# Patient Record
Sex: Male | Born: 1955 | Race: Black or African American | Hispanic: No | Marital: Married | Smoking: Former smoker
Health system: Southern US, Community
[De-identification: ages and names within clinical notes are randomized; demographics above are authoritative.]

## PROBLEM LIST (undated history)

## (undated) DIAGNOSIS — S46011A Strain of muscle(s) and tendon(s) of the rotator cuff of right shoulder, initial encounter: Secondary | ICD-10-CM

## (undated) DIAGNOSIS — I10 Essential (primary) hypertension: Secondary | ICD-10-CM

## (undated) DIAGNOSIS — S43491A Other sprain of right shoulder joint, initial encounter: Secondary | ICD-10-CM

## (undated) HISTORY — PX: COLONOSCOPY: SHX174

---

## 1993-02-15 HISTORY — PX: SKIN GRAFT FULL THICKNESS LEG: SUR1299

## 1998-12-10 ENCOUNTER — Emergency Department (HOSPITAL_COMMUNITY): Admission: EM | Admit: 1998-12-10 | Discharge: 1998-12-10 | Payer: Self-pay | Admitting: Emergency Medicine

## 2001-01-28 ENCOUNTER — Emergency Department (HOSPITAL_COMMUNITY): Admission: EM | Admit: 2001-01-28 | Discharge: 2001-01-28 | Payer: Self-pay | Admitting: *Deleted

## 2001-04-08 ENCOUNTER — Emergency Department (HOSPITAL_COMMUNITY): Admission: EM | Admit: 2001-04-08 | Discharge: 2001-04-08 | Payer: Self-pay | Admitting: Emergency Medicine

## 2002-02-13 ENCOUNTER — Ambulatory Visit (HOSPITAL_COMMUNITY): Admission: RE | Admit: 2002-02-13 | Discharge: 2002-02-13 | Payer: Self-pay | Admitting: General Surgery

## 2002-08-01 ENCOUNTER — Emergency Department (HOSPITAL_COMMUNITY): Admission: EM | Admit: 2002-08-01 | Discharge: 2002-08-01 | Payer: Self-pay | Admitting: Emergency Medicine

## 2002-11-20 ENCOUNTER — Emergency Department (HOSPITAL_COMMUNITY): Admission: EM | Admit: 2002-11-20 | Discharge: 2002-11-21 | Payer: Self-pay | Admitting: *Deleted

## 2002-11-21 ENCOUNTER — Encounter: Payer: Self-pay | Admitting: *Deleted

## 2003-04-21 ENCOUNTER — Emergency Department (HOSPITAL_COMMUNITY): Admission: EM | Admit: 2003-04-21 | Discharge: 2003-04-21 | Payer: Self-pay | Admitting: Emergency Medicine

## 2004-07-02 ENCOUNTER — Emergency Department (HOSPITAL_COMMUNITY): Admission: EM | Admit: 2004-07-02 | Discharge: 2004-07-02 | Payer: Self-pay | Admitting: Emergency Medicine

## 2004-07-14 ENCOUNTER — Emergency Department (HOSPITAL_COMMUNITY): Admission: EM | Admit: 2004-07-14 | Discharge: 2004-07-14 | Payer: Self-pay | Admitting: Emergency Medicine

## 2004-07-27 ENCOUNTER — Emergency Department (HOSPITAL_COMMUNITY): Admission: EM | Admit: 2004-07-27 | Discharge: 2004-07-27 | Payer: Self-pay | Admitting: Emergency Medicine

## 2005-01-11 ENCOUNTER — Emergency Department (HOSPITAL_COMMUNITY): Admission: EM | Admit: 2005-01-11 | Discharge: 2005-01-11 | Payer: Self-pay | Admitting: Emergency Medicine

## 2005-01-12 ENCOUNTER — Emergency Department (HOSPITAL_COMMUNITY): Admission: EM | Admit: 2005-01-12 | Discharge: 2005-01-12 | Payer: Self-pay | Admitting: Emergency Medicine

## 2005-04-05 ENCOUNTER — Ambulatory Visit (HOSPITAL_COMMUNITY): Admission: RE | Admit: 2005-04-05 | Discharge: 2005-04-05 | Payer: Self-pay | Admitting: Internal Medicine

## 2005-04-05 ENCOUNTER — Encounter: Payer: Self-pay | Admitting: Internal Medicine

## 2005-04-05 ENCOUNTER — Ambulatory Visit: Payer: Self-pay | Admitting: Internal Medicine

## 2005-08-11 ENCOUNTER — Emergency Department (HOSPITAL_COMMUNITY): Admission: EM | Admit: 2005-08-11 | Discharge: 2005-08-11 | Payer: Self-pay | Admitting: Emergency Medicine

## 2006-06-19 ENCOUNTER — Emergency Department (HOSPITAL_COMMUNITY): Admission: EM | Admit: 2006-06-19 | Discharge: 2006-06-19 | Payer: Self-pay | Admitting: Emergency Medicine

## 2006-11-29 ENCOUNTER — Ambulatory Visit (HOSPITAL_COMMUNITY): Admission: RE | Admit: 2006-11-29 | Discharge: 2006-11-29 | Payer: Self-pay | Admitting: Internal Medicine

## 2006-11-29 ENCOUNTER — Encounter: Payer: Self-pay | Admitting: Orthopedic Surgery

## 2007-04-26 ENCOUNTER — Ambulatory Visit: Payer: Self-pay | Admitting: Orthopedic Surgery

## 2007-04-26 DIAGNOSIS — M169 Osteoarthritis of hip, unspecified: Secondary | ICD-10-CM

## 2007-04-26 DIAGNOSIS — M4800 Spinal stenosis, site unspecified: Secondary | ICD-10-CM

## 2007-04-26 DIAGNOSIS — M1610 Unilateral primary osteoarthritis, unspecified hip: Secondary | ICD-10-CM | POA: Insufficient documentation

## 2007-05-16 ENCOUNTER — Telehealth: Payer: Self-pay | Admitting: Orthopedic Surgery

## 2007-12-19 ENCOUNTER — Emergency Department (HOSPITAL_COMMUNITY): Admission: EM | Admit: 2007-12-19 | Discharge: 2007-12-19 | Payer: Self-pay | Admitting: Emergency Medicine

## 2008-02-19 ENCOUNTER — Encounter (HOSPITAL_COMMUNITY): Admission: RE | Admit: 2008-02-19 | Discharge: 2008-03-13 | Payer: Self-pay | Admitting: Internal Medicine

## 2009-08-16 ENCOUNTER — Emergency Department (HOSPITAL_COMMUNITY): Admission: EM | Admit: 2009-08-16 | Discharge: 2009-08-16 | Payer: Self-pay | Admitting: Emergency Medicine

## 2009-12-02 ENCOUNTER — Emergency Department (HOSPITAL_COMMUNITY): Admission: EM | Admit: 2009-12-02 | Discharge: 2009-12-02 | Payer: Self-pay | Admitting: Emergency Medicine

## 2010-07-03 NOTE — Op Note (Signed)
NAME:  [name], [name]             ACCOUNT NO.:  [id number]   MEDICAL RECORD NO.:  [id number]          PATIENT TYPE:  AMB   LOCATION:  DAY                           FACILITY:  [facility]   PHYSICIAN:  R. Roetta Sessions, M.D. DATE OF BIRTH:  04/13/1955   DATE OF PROCEDURE:  04/05/2005  DATE OF DISCHARGE:                                 OPERATIVE REPORT   PROCEDURE:  Colonoscopy with biopsy.   INDICATIONS FOR PROCEDURE:  The patient is a 55 year old gentleman with no  lower GI tract symptoms who is sent over for colorectal cancer screening at  the courtesy of Dr. Felecia Shelling. Potential risks, benefits, and alternatives have  been reviewed. There is no family history of colorectal neoplasia.  Colonoscopy is now being done.   PROCEDURE NOTE:  O2 saturation, blood pressure, pulse, and respirations were  monitored throughout the entire procedure. Conscious sedation with Versed 3  mg IV and Demerol 75 mg IV in divided doses.   INSTRUMENT:  Olympus video chip system.   FINDINGS:  Digital rectal exam revealed no abnormalities.   ENDOSCOPIC FINDINGS:  Prep was good.   Rectum:  Examination of the rectal mucosa including retroflexed view of the  anal verge revealed multiple 2 to 3 mm little papillomatous lesions on the  mucosa. Please see photos. Otherwise, rectal mucosa appeared normal.   Colon:  Colonic mucosa was surveyed from the rectosigmoid junction through  the left, transverse, and right colon to the area of the appendiceal  orifice, ileocecal valve, and cecum. These structures were well seen and  photographed for the record. From this level, the scope was slowly  withdrawn, and all previously mentioned mucosal surfaces were again seen.  The patient had sigmoid diverticula and had similar lesions at 30 cm in a  cluster of 4 to 5 that were seen in the distal rectum. This looked more like  papillomas. Two of these were biopsied with cold-biopsy forceps. The patient  tolerated the procedure well  and was reactive to endoscopy.   IMPRESSION:  1.  Multiple papillomatous lesions, distal rectum. Otherwise normal rectum.  2.  Cluster of similar lesions at 30 cm. Sigmoid diverticula. The remainder      of colonic mucosa appeared normal. Status post biopsy of the lesions at      30 cm.   RECOMMENDATIONS:  1.  Follow up on pathology.  2.  Diverticulosis literature provided to [name].  3.  Further recommendations to follow.      [name], M.D.  Electronically Signed     RMR/MEDQ  D:  04/05/2005  T:  04/05/2005  Job:  [id number]   cc:   Tesfaye D. Felecia Shelling, MD  Fax: [phone]

## 2010-07-03 NOTE — H&P (Signed)
   NAME:  [name], [name]                       ACCOUNT NO.:  [id number]   MEDICAL RECORD NO.:  [id number]                   PATIENT TYPE:  AMB   LOCATION:  DAY                                  FACILITY:  [facility]   PHYSICIAN:  Jerolyn Shin C. Katrinka Blazing, M.D.                DATE OF BIRTH:  [birth year]   DATE OF ADMISSION:  DATE OF DISCHARGE:                                HISTORY & PHYSICAL   HISTORY OF PRESENT ILLNESS:  [age]-year-old male with history of  painful mass of his right breast.  The mass has had rapid enlargement in  size.  His right breast is noticeably larger than the left.  Because of the  size of the mass it is felt that it needs to be excised under anesthesia.   PAST HISTORY:  The patient has hypertension.   MEDICATIONS:  The patient's only medication is Lotensin 20 mg q.d.   ALLERGIES:  The patient has no known allergies.   PAST SURGICAL HISTORY:  The patient's only surgery was a left subcutaneous  mastectomy in the past.   PHYSICAL EXAMINATION:  VITAL SIGNS:  Blood pressure 150/100, pulse 92,  respirations 20 and weight 259 pounds.  HEENT:  No abnormalities.  NECK:  Supple without JVD or bruits.  CHEST:  Clear to auscultation.  HEART:  Regular rate and rhythm without murmur, gallop or rub.  ABDOMEN:  Soft and nontender.  No masses.  BREASTS:  Large tender mass, right breast, with mild fixation.  No  induration or skin changes.  EXTREMITIES:  No cyanosis, clubbing or edema.  NEUROLOGIC EXAMINATION:  No focal motor, sensory or cerebellar deficit.   IMPRESSION:  1. Symptomatic mass, right breast, probable gynecomastia.  2. Hypertension.   PLAN:  Subcutaneous mastectomy on the right.   ADDENDUM:  Other medication is Cardizem LA 240 mg q.h.s.                                                 Dirk Dress. Katrinka Blazing, M.D.   LCS/MEDQ  D:  02/13/2002  T:  02/13/2002  Job:  [id number]

## 2010-07-03 NOTE — Op Note (Signed)
   NAME:  [name], [name]                       ACCOUNT NO.:  [id number]   MEDICAL RECORD NO.:  [id number]                   PATIENT TYPE:  AMB   LOCATION:  DAY                                  FACILITY:  [facility]   PHYSICIAN:  Jerolyn Shin C. Katrinka Blazing, M.D.                DATE OF BIRTH:  Oct 08, 1955   DATE OF PROCEDURE:  02/13/2002  DATE OF DISCHARGE:                                 OPERATIVE REPORT   PREOPERATIVE DIAGNOSIS:  Right breast mass.   POSTOPERATIVE DIAGNOSIS:  Right breast mass, pathology pending.   PROCEDURE:  Right subcutaneous mastectomy.   SURGEON:  Dirk Dress. Katrinka Blazing, M.D.   DESCRIPTION OF PROCEDURE:  Under general LMA anesthesia, the patient's  breast and chest were prepped and draped in a sterile field.  Curvilinear  subareolar incision was made.  The incision was extended into the  subcutaneous tissue.  A superior skin flap was developed.  This was  developed to encompass the mass.  Incision was then extended down to the  chest wall to remove all of the tissue in the subareolar area.  A large  volume of tissue was removed.  Deep tissues were reapproximated using  interrupted 2-0 Biosyn.  Subcutaneous tissue was closed with 3-0 Biosyn.  Skin was closed with subcuticular 4-0 Dexon.  A compression dressing was  placed.  The patient tolerated the procedure well.  He was awakened from  anesthesia, transferred to a bed, and taken to the postanesthetic care unit  for monitoring.                                                Dirk Dress. Katrinka Blazing, M.D.    LCS/MEDQ  D:  02/13/2002  T:  02/13/2002  Job:  [id number]

## 2012-07-19 ENCOUNTER — Telehealth: Payer: Self-pay | Admitting: Orthopedic Surgery

## 2012-07-19 ENCOUNTER — Emergency Department (HOSPITAL_COMMUNITY)
Admission: EM | Admit: 2012-07-19 | Discharge: 2012-07-19 | Disposition: A | Discharge status: Home / Self Care | Payer: 59 | Attending: Emergency Medicine | Admitting: Emergency Medicine

## 2012-07-19 ENCOUNTER — Encounter (HOSPITAL_COMMUNITY): Payer: Self-pay | Admitting: Emergency Medicine

## 2012-07-19 DIAGNOSIS — X500XXA Overexertion from strenuous movement or load, initial encounter: Secondary | ICD-10-CM | POA: Insufficient documentation

## 2012-07-19 DIAGNOSIS — Z79899 Other long term (current) drug therapy: Secondary | ICD-10-CM | POA: Insufficient documentation

## 2012-07-19 DIAGNOSIS — Y929 Unspecified place or not applicable: Secondary | ICD-10-CM | POA: Insufficient documentation

## 2012-07-19 DIAGNOSIS — Y9389 Activity, other specified: Secondary | ICD-10-CM | POA: Insufficient documentation

## 2012-07-19 DIAGNOSIS — S29011A Strain of muscle and tendon of front wall of thorax, initial encounter: Secondary | ICD-10-CM

## 2012-07-19 DIAGNOSIS — F17200 Nicotine dependence, unspecified, uncomplicated: Secondary | ICD-10-CM | POA: Insufficient documentation

## 2012-07-19 DIAGNOSIS — IMO0002 Reserved for concepts with insufficient information to code with codable children: Secondary | ICD-10-CM | POA: Insufficient documentation

## 2012-07-19 DIAGNOSIS — IMO0001 Reserved for inherently not codable concepts without codable children: Secondary | ICD-10-CM | POA: Insufficient documentation

## 2012-07-19 MED ORDER — HYDROCODONE-ACETAMINOPHEN 5-325 MG PO TABS: 1.0000 | ORAL_TABLET | ORAL | Status: DC | PRN

## 2012-07-19 NOTE — Telephone Encounter (Signed)
Patient called following visit at [facility] Emergency Room today, 07/19/12, for problem pectoral muscle injury, while weight-lifting; received referral to Dr. Romeo Apple for further management.  Please review and advise if this is to be scheduled with our office, or if another recommendation.  [employer] is [employer], [place], under spouse).  Patient cell # is [phone].

## 2012-07-19 NOTE — ED Notes (Signed)
Pt states he was lifting weights and "feels like the muscle from his left arm to chest separated".

## 2012-07-20 ENCOUNTER — Encounter: Payer: Self-pay | Admitting: Orthopedic Surgery

## 2012-07-20 ENCOUNTER — Telehealth: Payer: Self-pay | Admitting: Orthopedic Surgery

## 2012-07-20 ENCOUNTER — Ambulatory Visit (INDEPENDENT_AMBULATORY_CARE_PROVIDER_SITE_OTHER): Payer: 59 | Admitting: Orthopedic Surgery

## 2012-07-20 VITALS — BP 144/98 | Ht 72.0 in | Wt 243.0 lb

## 2012-07-20 DIAGNOSIS — S29011A Strain of muscle and tendon of front wall of thorax, initial encounter: Secondary | ICD-10-CM | POA: Insufficient documentation

## 2012-07-20 DIAGNOSIS — IMO0002 Reserved for concepts with insufficient information to code with codable children: Secondary | ICD-10-CM

## 2012-07-20 NOTE — ED Provider Notes (Signed)
History     CSN: [id number]  Arrival date & time 07/19/12  1113   First MD Initiated Contact with Patient 07/19/12 1126      Chief Complaint  Patient presents with  . Arm Injury    (Consider location/radiation/quality/duration/timing/severity/associated sxs/prior treatment) HPI Comments: [name] is a 57 y.o. Male with no significant past medical history presenting with pain and deformity to his left chest after feeling and hearing a loud "pop" when lifting weights prior to arrival.  He is not new to lifting, but states did not warm up prior to lifting today and when he was on about the 20th rep of benching 380 lbs,  He felt a sudden popping sensation. He denies pain at this time,  But has soreness with palpation and with abducting and adducting the left arm such as turning the steering wheel while driving.  He has taken no medicines prior to arrival.  There is no radiation of pain, no pain in the shoulder or back.     The history is provided by the patient.    History reviewed. No pertinent past medical history.  History reviewed. No pertinent past surgical history.  No family history on file.  History  Substance Use Topics  . Smoking status: Current Every Day Smoker -- 1.00 packs/day  . Smokeless tobacco: Not on file  . Alcohol Use: No      Review of Systems  Constitutional: Negative for fever and chills.  Respiratory: Negative for cough and shortness of breath.   Gastrointestinal: Negative for nausea.  Musculoskeletal: Positive for myalgias and arthralgias. Negative for joint swelling.  Neurological: Negative for weakness and numbness.    Allergies  Review of patient's allergies indicates no known allergies.  Home Medications   Current Outpatient Rx  Name  Route  Sig  Dispense  Refill  . valsartan-hydrochlorothiazide (DIOVAN-HCT) 320-25 MG per tablet   Oral   Take 1 tablet by mouth daily.         Marland Kitchen HYDROcodone-acetaminophen (NORCO/VICODIN) 5-325  MG per tablet   Oral   Take 1 tablet by mouth every 4 (four) hours as needed for pain.   20 tablet   0     BP 167/97  Pulse 102  Temp(Src) 98.9 F (37.2 C)  Resp 20  Ht 6' (1.829 m)  Wt 240 lb (108.863 kg)  BMI 32.54 kg/m2  SpO2 100%  Physical Exam  Constitutional: He appears well-developed and well-nourished.  HENT:  Head: Atraumatic.  Neck: Normal range of motion.  Cardiovascular:  Pulses equal bilaterally  Musculoskeletal: He exhibits tenderness. He exhibits no edema.       Left shoulder: He exhibits tenderness and deformity. He exhibits normal range of motion, no bony tenderness, no swelling, no spasm and normal pulse.  Mild ttp left axilla.  There is no visible deformity except when patient flexes his pectoralis muscles,  There is a disruption along the lower left lateral  pectoralis border.No edema,  No ecchymosis.  Neurological: He is alert. He has normal strength. He displays normal reflexes. No sensory deficit.  Equal strength  Skin: Skin is warm and dry.  Psychiatric: He has a normal mood and affect.    ED Course  Procedures (including critical care time)  Labs Reviewed - No data to display No results found.   1. Pectoralis muscle rupture, initial encounter       MDM  Suspect partial pectoralis muscle tear.  Pt was prescribed hydrocodone,  Encouraged ice to minimize  edema.  Referral to Dr. Romeo Apple for further eval.  Advised pt he may require surgical repair. Pt understands plan and will call for appt.  Discussed with Dr. Blinda Leatherwood prior to dc home.  No imaging warranted at this time.  The patient appears reasonably screened and/or stabilized for discharge and I doubt any other medical condition or other [facility] requiring further screening, evaluation, or treatment in the ED at this time prior to discharge.         Burgess Amor, PA-C 07/20/12 2150

## 2012-07-20 NOTE — Telephone Encounter (Signed)
Called insurer, [employer], ph# [phone], regarding MRI, CPT [phone]; per Tonia A, no pre-authorization required.  Ref # [id number], received 07/20/12, 1:54pm.   [facility]; per Misty Stanley, MRI scheduled for tomorrow, 07/21/12, 4:00pm, 3:45pm registration.  Called patient and notified, and scheduled follow-up appointment in office for review of results.

## 2012-07-20 NOTE — Telephone Encounter (Signed)
Scheduled for today; also spoke w/Ginger at Dr Renard Matter' office - patient is there now, and coming here directly.

## 2012-07-20 NOTE — Telephone Encounter (Signed)
i am not sure why this needs my approval is there an insurance issue?  If not he needs appt immediately

## 2012-07-20 NOTE — Progress Notes (Signed)
Patient ID: [name], male   DOB: May 18, 1955, 57 y.o.   MRN: [id number] Chief Complaint  Patient presents with  . 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 He went to the ER they advised to come here for evaluation and treatment. He complains of sharp throbbing stabbing 10 out of 10 pain over the left pectoralis muscle with some bruising in the left arm and chest  Review of systems negative  History of hypertension history of chest wall/muscle cyst excision  Takes Diovan and hydrochlorothiazide for blood pressure  Family history is negative  He is married  He is a one pack per day smoker does not use alcohol  General appearance is normal, the patient is alert and oriented x3 with normal mood and affect. BP 144/98  Ht 6' (1.829 m)  Wt 243 lb (110.224 kg)  BMI 32.95 kg/m2  Lower extremity exam  Ambulation is normal.  The right and left lower extremity:  Inspection and palpation revealed no tenderness or abnormality in alignment in the lower extremities. Range of motion is full.  Strength is grade 5.  All joints are stable.  Right Upper extremity exam  Inspection and palpation revealed no abnormalities in the upper extremities.   Range of motion is full without contracture.  Motor exam is normal with grade 5 strength.  The joints are fully reduced without subluxation.  There is no atrophy or tremor and muscle tone is normal.  All joints are stable.  Left upper extremity there is a significant amount of bruising in the left arm and left chest with tenderness and palpable defect in the pectoralis tendon area with decreased adduction against resistance compared to the right. Elbow shoulder hand are normal. No subluxation. Decreased for elevation and abduction.  Impression is pectoralis tendon  rupture left shoulder  MRI to plan surgical procedure  The surgery will be planned and done after the patient's daughters graduation

## 2012-07-20 NOTE — Patient Instructions (Addendum)
MRI left shoulder

## 2012-07-21 ENCOUNTER — Ambulatory Visit (HOSPITAL_COMMUNITY)
Admission: RE | Admit: 2012-07-21 | Discharge: 2012-07-21 | Disposition: A | Discharge status: Home / Self Care | Payer: 59 | Source: Ambulatory Visit | Attending: Orthopedic Surgery | Admitting: Orthopedic Surgery

## 2012-07-21 DIAGNOSIS — S29011A Strain of muscle and tendon of front wall of thorax, initial encounter: Secondary | ICD-10-CM

## 2012-07-22 NOTE — ED Provider Notes (Signed)
Medical screening examination/treatment/procedure(s) were performed by non-physician practitioner and as supervising physician I was immediately available for consultation/collaboration.    Gilda Crease, MD 07/22/12 [phone]

## 2012-07-24 ENCOUNTER — Other Ambulatory Visit: Payer: Self-pay | Admitting: Orthopedic Surgery

## 2012-07-24 ENCOUNTER — Ambulatory Visit
Admission: RE | Admit: 2012-07-24 | Discharge: 2012-07-24 | Disposition: A | Discharge status: Home / Self Care | Payer: 59 | Source: Ambulatory Visit | Attending: Orthopedic Surgery | Admitting: Orthopedic Surgery

## 2012-07-24 ENCOUNTER — Telehealth: Payer: Self-pay | Admitting: *Deleted

## 2012-07-24 ENCOUNTER — Other Ambulatory Visit: Payer: Self-pay | Admitting: *Deleted

## 2012-07-24 DIAGNOSIS — S29011A Strain of muscle and tendon of front wall of thorax, initial encounter: Secondary | ICD-10-CM

## 2012-07-24 NOTE — Telephone Encounter (Signed)
07/24/12 - Refer to note entered by nurse, Tammy Long.  Patient came to our office this morning; relayed that [facility] was unable to do his MRI, said he was advised by radiology tech that he "was too wide for their machine."  See updated order, and updated MRI appointment, scheduled at [facility] for today, 07/24/12, 1:30p.m; Nurse note indicates for insurer, [employer], no pre-authorization required per Ethel Rana, today's date for Reference.  Patient has a scheduled follow up appointment here for results.

## 2012-07-24 NOTE — Telephone Encounter (Signed)
.   The patient went for MRI Friday 07/21/12 at [facility], and he was too wide for the machine. Call was made to  [facility] for precert for [facility], but no precert required per [employer]. Ref # [id number] [id number] 07/24/2012. [facility] was contacted and appointment was made for today at 1:30pm.

## 2012-07-24 NOTE — Telephone Encounter (Signed)
FYI

## 2012-07-25 ENCOUNTER — Ambulatory Visit: Payer: 59 | Admitting: Orthopedic Surgery

## 2012-07-26 ENCOUNTER — Telehealth: Payer: Self-pay | Admitting: Orthopedic Surgery

## 2012-07-26 NOTE — Telephone Encounter (Signed)
Called patient  Pectoralis muscle torn, left will need surgery  He will call Monday to set up surgery for sometime next week

## 2012-07-27 ENCOUNTER — Ambulatory Visit: Payer: 59 | Admitting: Orthopedic Surgery

## 2012-07-31 ENCOUNTER — Other Ambulatory Visit: Payer: Self-pay | Admitting: *Deleted

## 2012-08-01 ENCOUNTER — Ambulatory Visit: Payer: 59 | Admitting: Orthopedic Surgery

## 2012-08-02 ENCOUNTER — Encounter (HOSPITAL_COMMUNITY): Payer: Self-pay | Admitting: Pharmacy Technician

## 2012-08-02 ENCOUNTER — Encounter (HOSPITAL_COMMUNITY)
Admission: RE | Admit: 2012-08-02 | Discharge: 2012-08-02 | Disposition: A | Discharge status: Home / Self Care | Payer: 59 | Source: Ambulatory Visit | Attending: Orthopedic Surgery | Admitting: Orthopedic Surgery

## 2012-08-02 ENCOUNTER — Other Ambulatory Visit: Payer: Self-pay

## 2012-08-02 ENCOUNTER — Encounter (HOSPITAL_COMMUNITY): Payer: Self-pay

## 2012-08-02 HISTORY — DX: Essential (primary) hypertension: I10

## 2012-08-02 LAB — BASIC METABOLIC PANEL
Creatinine, Ser: 1.3 mg/dL (ref 0.50–1.35)
GFR calc Af Amer: 69 mL/min — ABNORMAL LOW (ref >90)
Glucose, Bld: 95 mg/dL (ref 70–99)
Potassium: 3.6 mEq/L (ref 3.5–5.1)

## 2012-08-02 LAB — SURGICAL PCR SCREEN
MRSA, PCR: NEGATIVE
Staphylococcus aureus: NEGATIVE

## 2012-08-02 NOTE — Patient Instructions (Addendum)
[name]  08/02/2012   Your procedure is scheduled on:  08/07/2012  Report to [facility] at  710  AM.  Call this number if you have problems the morning of surgery: [phone]   Remember:   Do not eat food or drink liquids after midnight.   Take these medicines the morning of surgery with A SIP OF WATER: diovan,pain pill   Do not wear jewelry, make-up or nail polish.  Do not wear lotions, powders, or perfumes.   Do not shave 48 hours prior to surgery. Men may shave face and neck.  Do not bring valuables to the hospital.  [facility] is not responsible  for any belongings or valuables.  Contacts, dentures or bridgework may not be worn into surgery.  Leave suitcase in the car. After surgery it may be brought to your room.  For patients admitted to the hospital, checkout time is 11:00 AM the day of discharge.   Patients discharged the day of surgery will not be allowed to drive  home.  Name and phone number of your driver: family  Special Instructions: Shower using CHG 2 nights before surgery and the night before surgery.  If you shower the day of surgery use CHG.  Use special wash - you have one bottle of CHG for all showers.  You should use approximately 1/3 of the bottle for each shower.   Please read over the following fact sheets that you were given: Pain Booklet, Coughing and Deep Breathing, MRSA Information, Surgical Site Infection Prevention, Anesthesia Post-op Instructions and Care and Recovery After Surgery Pectoralis Major Rupture with Rehab  The pectoralis major is the main muscle of the chest. It is responsible for bringing the arm across the body and for rotating the arm inward. A pectoralis major rupture is a tear in the tendon that attaches the chest muscle to the upper arm bone (humerus). When the tendon is torn, there is a loss of connection between the muscle and the bone. Pectoralis major ruptures usually involve the tendon pulling off from the arm bone,  although sometimes the muscle may tear in the mid-belly or at the point where the muscle becomes tendon. SYMPTOMS   A "pop" or tearing, and severe sharp, often burning, pain in the chest at the time of injury.  Tenderness, swelling, warmth, or redness and later bruising (contusion) over and around the pectoralis muscle-tendon, chest, and armpit region.  Pain and weakness when trying to use the chest muscle.  Loss of shape of the armpit region, especially when pushing your hands together in front of your body.  Loss of firm fullness, when pushing on the area where the tendon ruptured (a defect between the ends of the tendon and bone where they separated from each other). CAUSES  The pectoralis major muscle or tendon tears when a force is placed on it that is greater than it can handle. Common causes of injury include:  Sudden episode of stressful over-activity.  Direct hit (trauma) to the chest.  Fall from a height. RISK INCREASES WITH:  Sports that require excessive muscle stress, (weightlifting).  Contact sports with minimal protective devices for the chest.  Wrestling.  Poor strength and flexibility.  Previous injury to the chest muscle.  Untreated pectoralis major tendinitis.  Corticosteroid injection into the pectoralis major tendon. (Corticosteroid injections damage tendons.)  Oral anabolic steroid use. PREVENTION  Warm up and stretch properly before activity.  Allow for adequate recovery between workouts.  Maintain physical fitness:  Strength, flexibility, and endurance.  Cardiovascular fitness. PROGNOSIS  If treated properly, pectoralis major ruptures are usually curable, with a return to sports in 6 to 9 months.  RELATED COMPLICATIONS   Weakness of the pectoralis major, especially if left untreated.  Re-rupture of the tendon after treatment.  Prolonged disability.  Risks of surgery: infection, injury to nerves (numbness, weakness, or paralysis),  bleeding, hematoma (blood clot), pseudocyst (collection of fluid), shoulder stiffness, shoulder weakness, and pain with strenuous activity.  Loss of chest or armpit shape.  Inability to repair rupture. TREATMENT Treatment first involves resting from any activities that aggravate the symptoms. The use of ice and medicine will help reduce pain and inflammation. The use of strengthening and stretching exercises may help reduce pain with activity. These exercises may be performed at home. However, referral to a therapist may be advised for further evaluation and treatment, such as ultrasound therapy. If the rupture occurs in the muscle or the muscle-tendon juncture, surgery repair is not possible. However, for tears that occur at the attachment site of the arm bone, surgery may be advised. Without surgery, the loss of normal armpit shape and weakness of the shoulder will persist. For the best chance of a successful outcome, surgery must be performed within the first few weeks after injury. After surgery, the chest and shoulder of the affected side are restrained, to allow for healing. After restraint, it is important to perform strengthening and stretching exercises to help regain strength and a full range of motion.  MEDICATION   If pain medicine is needed, nonsteroidal anti-inflammatory medicines (aspirin and ibuprofen), or other minor pain relievers (acetaminophen), are often advised.  Do not take pain medicine for 7 days before surgery.  Prescription pain relievers may be given, if your caregiver thinks they are needed. Use only as directed and only as much as you need. COLD THERAPY  Cold treatment (icing) should be applied for 10 to 15 minutes every 2 to 3 hours for inflammation and pain, and immediately after activity that aggravates your symptoms. Use ice packs or an ice massage. SEEK MEDICAL CARE IF:  Pain increases, despite treatment.  Any of the following occur after surgery: signs of  infection, including fever, increased pain, swelling, redness, drainage of fluids, or bleeding in the affected area.  New, unexplained symptoms develop. (Drugs used in treatment may produce side effects.) EXERCISES RANGE OF MOTION (ROM) AND STRETCHING EXERCISES - Pectoralis Major Rupture These exercises may help you when beginning to rehabilitate your injury. Your symptoms may resolve with or without further involvement from your physician, physical therapist or athletic trainer. While completing these exercises, remember:   Restoring tissue flexibility helps normal motion to return to the joints. This allows healthier, less painful movement and activity.  An effective stretch should be held for at least 30 seconds.  A stretch should never be painful. You should only feel a gentle lengthening or release in the stretched tissue. ROM - Pendulum  Bend at the waist so that your right / left arm falls away from your body. Support yourself with your opposite hand on a solid surface, such as a table or a countertop.  Your right / left arm should be perpendicular to the ground. If it is not perpendicular, you need to lean over farther. Relax the muscles in your right / left arm and shoulder as much as possible.  Gently sway your hips and trunk so they move your right / left arm without any  use of your right / left shoulder muscles.  Progress your movements so that your right / left arm moves side to side, then forward and backward, and finally, both clockwise and counterclockwise.  Complete __________ repetitions in each direction. Many people use this exercise to relieve discomfort in their shoulder, as well as to gain range of motion. Repeat __________ times. Complete this exercise __________ times per day. STRETCH  Flexion, Standing  Stand with good posture. With an underhand grip on your right / left and an overhand grip on the opposite hand, grasp a broomstick or cane so that your hands are a  little more than shoulder width apart.  Keeping your right / left elbow straight and shoulder muscles relaxed, push the stick with your opposite hand to raise your right / left arm in front of your body and then overhead. Raise your arm until you feel a stretch in your right / left shoulder, but before you have increased shoulder pain.  Try to avoid shrugging your right / left shoulder as your arm rises, by keeping your shoulder blade tucked down and toward your mid-back spine. Hold __________ seconds.  Slowly return to the starting position. Repeat __________ times. Complete this exercise __________ times per day.  STRETCH  Abduction, Supine  Lie on your back. With an underhand grip on your right / left hand and an overhand grip on the opposite hand, grasp a broomstick or cane so that your hands are a little more than shoulder width apart.  Keeping your right / left elbow straight and shoulder muscles relaxed, push the stick with your opposite hand to raise your right / left arm out to the side of your body and then overhead. Raise your arm until you feel a stretch in your right / left shoulder, but before you have increased shoulder pain.  Try to avoid shrugging your right / left shoulder as your arm rises, by keeping your shoulder blade tucked down and toward your mid-back spine. Hold __________ seconds.  Slowly return to the starting position. Repeat __________ times. Complete this exercise __________ times per day.  ROM  Flexion, Active-Assisted  Lie on your back. You may bend your knees for comfort.  Grasp a broomstick or cane so your hands are about shoulder width apart. Your right / left hand should grip the end of the stick, so that your hand is positioned "thumbs-up," as if you were about to shake hands.  Using your healthy arm to lead, raise your right / left arm overhead until you feel a gentle stretch in your shoulder. Hold __________ seconds.  Use the stick to assist in  returning your right / left arm to its starting position. Repeat __________ times. Complete this exercise __________ times per day.  STRETCH - External Rotation and Abductio  Stagger your stance through a doorframe. It does not matter which foot is forward.  Choose one of the following positions as instructed by your physician, physical therapist or athletic trainer. Place your hands:  and forearms above your head and on the door frame.  and forearms at head height and on the door frame.  at elbow height and on the door frame.  Keeping your head and chest upright and your stomach muscles tight to prevent over-extending your low-back, slowly shift your weight onto your front foot until you feel a stretch across your chest and in the front of your shoulders.  Hold __________ seconds. Shift your weight to your back foot to release  the stretch. Repeat __________ times. Complete this stretch __________ times per day.  STRENGTHENING EXERCISES - Pectoralis Major Rupture  These exercises may help you when beginning to rehabilitate your injury. They may resolve your symptoms with or without further involvement from your physician, physical therapist or athletic trainer. While completing these exercises, remember:   Muscles can gain both the endurance and the strength needed for everyday activities through controlled exercises.  Complete these exercises as instructed by your physician, physical therapist or athletic trainer. Increase the resistance and repetitions only as guided by your caregiver.  You may experience muscle soreness or fatigue, but the pain or discomfort you are trying to eliminate should never worsen during these exercises. If this pain does worsen, stop and make certain you are following the directions exactly. If the pain is still present after adjustments, discontinue the exercise until you can discuss the trouble with your caregiver. STRENGTH - Scapular Protractors,  Standing  Stand arms length away from a wall. Place your hands on the wall, keeping your elbows straight.  Begin by dropping your shoulder blades down and toward your mid-back spine.  To strengthen your protractors, keep your shoulder blades down, but slide them forward on your rib cage. It will feel as if you are lifting the back of your rib cage away from the wall. This is a subtle motion and can be challenging to complete. Ask your caregiver for further instruction, if you are not sure you are doing the exercise correctly.  Hold for __________ seconds. Slowly return to the starting position, resting the muscles completely before starting the next repetition. Repeat __________ times. Complete this exercise __________ times per day. STRENGTH - Horizontal Abductors Choose one of the two positions to complete this exercise. Prone: lying on stomach:  Lie on your stomach on a firm surface so that your right / left arm overhangs the edge. Rest your forehead on your opposite forearm. With your palm facing the floor and your elbow straight, hold a __________ weight in your hand.  Squeeze your right / left shoulder blade to your mid-back spine and then slowly raise your arm to the height of the bed.  Hold for __________ seconds. Slowly reverse the directions and return to the starting position, controlling the weight as you lower your arm. Repeat __________ times. Complete this exercise __________ times per day. Standing:   Secure a rubber exercise band or tubing to a fixed object (table, pole) so that it is at the height of your shoulders when you are either standing, or sitting on a firm armless chair.  Grasp an end of the band in each hand and have your palms face each other. Straighten your elbows and lift your hands straight in front of you at shoulder height. Step back away from the secured end of band until it becomes tense.  Squeeze your shoulder blades together. Keeping your elbows locked  and your hands at shoulder height, bring your hands out to your sides.  Hold __________ seconds. Slowly ease the tension on the band, as you reverse the directions and return to the starting position. Repeat __________ times. Complete this exercise __________ times per day. STRENGTH - Scapular Protractors, Quadruped  Get onto your hands and knees with your shoulders directly over your hands (or as close as you can comfortably be).  Keeping your elbows locked, lift the back of your rib cage up into your shoulder blades so your mid-back rounds out. Keep your neck muscles relaxed.  Hold  this position for __________ seconds. Slowly return to the starting position and allow your muscles to relax completely before starting the next repetition. Repeat __________ times. Complete this exercise __________ times per day.  STRENGTH  Scapular Depressors  Find a sturdy chair without wheels, such as a dining table chair, and place your hands on the armrests.  Keeping your feet on the floor, lift your bottom from the seat and lock your elbows.  Keeping your elbows straight, allow gravity to pull your body weight down. Your shoulders will rise toward your ears.  Raise your body against gravity by drawing your shoulder blades down your back, shortening the distance between your shoulders and ears. Although your feet should always maintain contact with the floor, your feet should progressively support less body weight as you get stronger.  Hold __________ seconds. In a controlled and slow manner, lower your body weight to begin the next repetition. Repeat __________ times. Complete this exercise __________ times per day.  STRENGTH - Scapular Protractors, Supine  Lie on your back on a firm surface. Extend your right / left arm straight into the air while holding a __________ weight in your hand.  Keeping your head and back in place, lift your shoulder off the floor.  Hold __________ seconds. Slowly return  to the starting position and allow your muscles to relax completely before starting the next repetition. Repeat __________ times. Complete this exercise __________ times per day. Document Released: 02/01/2005 Document Revised: 04/26/2011 Document Reviewed: 05/16/2008 [facility] Patient Information 2014 [place], [place]. PATIENT INSTRUCTIONS POST-ANESTHESIA  IMMEDIATELY FOLLOWING SURGERY:  Do not drive or operate machinery for the first twenty four hours after surgery.  Do not make any important decisions for twenty four hours after surgery or while taking narcotic pain medications or sedatives.  If you develop intractable nausea and vomiting or a severe headache please notify your doctor immediately.  FOLLOW-UP:  Please make an appointment with your surgeon as instructed. You do not need to follow up with anesthesia unless specifically instructed to do so.  WOUND CARE INSTRUCTIONS (if applicable):  Keep a dry clean dressing on the anesthesia/puncture wound site if there is drainage.  Once the wound has quit draining you may leave it open to air.  Generally you should leave the bandage intact for twenty four hours unless there is drainage.  If the epidural site drains for more than 36-48 hours please call the anesthesia department.  QUESTIONS?:  Please feel free to call your physician or the hospital operator if you have any questions, and they will be happy to assist you.

## 2012-08-03 ENCOUNTER — Telehealth: Payer: Self-pay | Admitting: Orthopedic Surgery

## 2012-08-03 NOTE — Telephone Encounter (Signed)
Regarding out-patient surgery scheduled 08/07/12 at [facility], CPT [phone], ICD9 code 27.8, contacted [employer] at ph#[phone]; reached automated voice response system at 4:59pm, 08/03/12, recording states "closed."  Accessed online submission pre-authorization form at [email or web address]; submitted.  Response is to be returned within 24 hours.

## 2012-08-04 ENCOUNTER — Telehealth: Payer: Self-pay | Admitting: Orthopedic Surgery

## 2012-08-05 NOTE — H&P (Signed)
  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 He went to the ER they advised to come here for evaluation and treatment. He complains of sharp throbbing stabbing 10 out of 10 pain over the left pectoralis muscle with some bruising in the left arm and chest  Review of systems negative  History of hypertension history of chest wall/muscle cyst excision  Takes Diovan and hydrochlorothiazide for blood pressure  Family history is negative  He is married  He is a one pack per day smoker does not use alcohol  General appearance is normal, the patient is alert and oriented x3 with normal mood and affect.  BP 144/98  Ht 6' (1.829 m)  Wt 243 lb (110.224 kg)  BMI 32.95 kg/m2  Lower extremity exam  Ambulation is normal.  The right and left lower extremity:  Inspection and palpation revealed no tenderness or abnormality in alignment in the lower extremities.  Range of motion is full. Strength is grade 5.  All joints are stable.  Right Upper extremity exam  Inspection and palpation revealed no abnormalities in the upper extremities.  Range of motion is full without contracture.  Motor exam is normal with grade 5 strength.  The joints are fully reduced without subluxation.  There is no atrophy or tremor and muscle tone is normal. All joints are stable.  Left upper extremity there is a significant amount of bruising in the left arm and left chest with tenderness and palpable defect in the pectoralis tendon area with decreased adduction against resistance compared to the right. Elbow shoulder hand are normal. No subluxation. Decreased for elevation and abduction.   MRI has confirmed that he has a pectoralis musculotendinous junction rupture and will need repair   Impression is pectoralis tendon rupture left  shoulder  As I discussed with the patient he will have a significant time for recovery from this significant and severe injury. He will have to curb his weight lifting he will not be able to lift weights for at least 3 months more like 6 months. He will have some difficulty in terms of being able to drive for several weeks. This is a significant injury and lower quad area a significant amount of time for recovery

## 2012-08-07 ENCOUNTER — Encounter (HOSPITAL_COMMUNITY): Payer: Self-pay | Admitting: Anesthesiology

## 2012-08-07 ENCOUNTER — Ambulatory Visit (HOSPITAL_COMMUNITY): Payer: 59 | Admitting: Anesthesiology

## 2012-08-07 ENCOUNTER — Ambulatory Visit (HOSPITAL_COMMUNITY)
Admission: RE | Admit: 2012-08-07 | Discharge: 2012-08-07 | Disposition: A | Discharge status: Home / Self Care | Payer: 59 | Source: Ambulatory Visit | Attending: Orthopedic Surgery | Admitting: Orthopedic Surgery

## 2012-08-07 ENCOUNTER — Encounter (HOSPITAL_COMMUNITY): Payer: Self-pay | Admitting: *Deleted

## 2012-08-07 ENCOUNTER — Encounter (HOSPITAL_COMMUNITY)
Admission: RE | Disposition: A | Discharge status: Home / Self Care | Payer: Self-pay | Source: Ambulatory Visit | Attending: Orthopedic Surgery

## 2012-08-07 DIAGNOSIS — S29011D Strain of muscle and tendon of front wall of thorax, subsequent encounter: Secondary | ICD-10-CM

## 2012-08-07 DIAGNOSIS — Z5189 Encounter for other specified aftercare: Secondary | ICD-10-CM

## 2012-08-07 DIAGNOSIS — S29011A Strain of muscle and tendon of front wall of thorax, initial encounter: Secondary | ICD-10-CM

## 2012-08-07 DIAGNOSIS — Z01810 Encounter for preprocedural cardiovascular examination: Secondary | ICD-10-CM | POA: Insufficient documentation

## 2012-08-07 DIAGNOSIS — S43499A Other sprain of unspecified shoulder joint, initial encounter: Secondary | ICD-10-CM | POA: Insufficient documentation

## 2012-08-07 DIAGNOSIS — IMO0002 Reserved for concepts with insufficient information to code with codable children: Secondary | ICD-10-CM

## 2012-08-07 DIAGNOSIS — Z79899 Other long term (current) drug therapy: Secondary | ICD-10-CM | POA: Insufficient documentation

## 2012-08-07 DIAGNOSIS — I10 Essential (primary) hypertension: Secondary | ICD-10-CM | POA: Insufficient documentation

## 2012-08-07 DIAGNOSIS — S46819A Strain of other muscles, fascia and tendons at shoulder and upper arm level, unspecified arm, initial encounter: Secondary | ICD-10-CM | POA: Insufficient documentation

## 2012-08-07 DIAGNOSIS — Y93B3 Activity, free weights: Secondary | ICD-10-CM | POA: Insufficient documentation

## 2012-08-07 DIAGNOSIS — X500XXA Overexertion from strenuous movement or load, initial encounter: Secondary | ICD-10-CM | POA: Insufficient documentation

## 2012-08-07 DIAGNOSIS — Z01812 Encounter for preprocedural laboratory examination: Secondary | ICD-10-CM | POA: Insufficient documentation

## 2012-08-07 HISTORY — PX: TENDON REPAIR: SHX5111

## 2012-08-07 SURGERY — TENDON REPAIR
Anesthesia: General | Site: Shoulder | Laterality: Left | Wound class: Clean

## 2012-08-07 MED ORDER — LIDOCAINE HCL (PF) 1 % IJ SOLN
INTRAMUSCULAR | Status: AC
  Filled 2012-08-07: qty 5

## 2012-08-07 MED ORDER — CEFAZOLIN SODIUM-DEXTROSE 2-3 GM-% IV SOLR
2.0000 g | INTRAVENOUS | Status: AC
  Administered 2012-08-07: 2 g via INTRAVENOUS

## 2012-08-07 MED ORDER — GLYCOPYRROLATE 0.2 MG/ML IJ SOLN
INTRAMUSCULAR | Status: DC | PRN
  Administered 2012-08-07: 0.6 mg via INTRAVENOUS

## 2012-08-07 MED ORDER — GLYCOPYRROLATE 0.2 MG/ML IJ SOLN
INTRAMUSCULAR | Status: AC
  Filled 2012-08-07: qty 3

## 2012-08-07 MED ORDER — BUPIVACAINE-EPINEPHRINE PF 0.5-1:200000 % IJ SOLN
INTRAMUSCULAR | Status: AC
  Filled 2012-08-07: qty 10

## 2012-08-07 MED ORDER — IBUPROFEN 800 MG PO TABS: 800.0000 mg | ORAL_TABLET | Freq: Three times a day (TID) | ORAL | Status: DC | PRN

## 2012-08-07 MED ORDER — ONDANSETRON HCL 4 MG/2ML IJ SOLN
INTRAMUSCULAR | Status: AC
  Filled 2012-08-07: qty 2

## 2012-08-07 MED ORDER — SUCCINYLCHOLINE CHLORIDE 20 MG/ML IJ SOLN
INTRAMUSCULAR | Status: AC
  Filled 2012-08-07: qty 1

## 2012-08-07 MED ORDER — FENTANYL CITRATE 0.05 MG/ML IJ SOLN
INTRAMUSCULAR | Status: DC | PRN
  Administered 2012-08-07 (×2): 50 ug via INTRAVENOUS

## 2012-08-07 MED ORDER — NEOSTIGMINE METHYLSULFATE 1 MG/ML IJ SOLN
INTRAMUSCULAR | Status: AC
  Filled 2012-08-07: qty 1

## 2012-08-07 MED ORDER — PROPOFOL 10 MG/ML IV BOLUS
INTRAVENOUS | Status: DC | PRN
  Administered 2012-08-07: 180 mg via INTRAVENOUS

## 2012-08-07 MED ORDER — OXYCODONE HCL 5 MG PO TABS
5.0000 mg | ORAL_TABLET | Freq: Once | ORAL | Status: AC
  Administered 2012-08-07: 5 mg via ORAL

## 2012-08-07 MED ORDER — ARTIFICIAL TEARS OP OINT
TOPICAL_OINTMENT | OPHTHALMIC | Status: AC
  Filled 2012-08-07: qty 3.5

## 2012-08-07 MED ORDER — FENTANYL CITRATE 0.05 MG/ML IJ SOLN
25.0000 ug | INTRAMUSCULAR | Status: DC | PRN
  Administered 2012-08-07: 25 ug via INTRAVENOUS

## 2012-08-07 MED ORDER — SODIUM CHLORIDE 0.9 % IR SOLN
Status: DC | PRN
  Administered 2012-08-07: 1000 mL

## 2012-08-07 MED ORDER — BUPIVACAINE-EPINEPHRINE PF 0.5-1:200000 % IJ SOLN
INTRAMUSCULAR | Status: DC | PRN
  Administered 2012-08-07: 60 mL

## 2012-08-07 MED ORDER — PHENYLEPHRINE HCL 10 MG/ML IJ SOLN
INTRAMUSCULAR | Status: AC
  Filled 2012-08-07: qty 1

## 2012-08-07 MED ORDER — DEXAMETHASONE SODIUM PHOSPHATE 4 MG/ML IJ SOLN
4.0000 mg | Freq: Once | INTRAMUSCULAR | Status: AC
  Administered 2012-08-07: 4 mg via INTRAVENOUS

## 2012-08-07 MED ORDER — MIDAZOLAM HCL 5 MG/5ML IJ SOLN
INTRAMUSCULAR | Status: DC | PRN
  Administered 2012-08-07: 2 mg via INTRAVENOUS

## 2012-08-07 MED ORDER — CEFAZOLIN SODIUM-DEXTROSE 2-3 GM-% IV SOLR
INTRAVENOUS | Status: AC
  Filled 2012-08-07: qty 50

## 2012-08-07 MED ORDER — ONDANSETRON HCL 4 MG/2ML IJ SOLN: 4.0000 mg | Freq: Once | INTRAMUSCULAR | Status: DC | PRN

## 2012-08-07 MED ORDER — ONDANSETRON HCL 4 MG/2ML IJ SOLN
4.0000 mg | Freq: Once | INTRAMUSCULAR | Status: AC
  Administered 2012-08-07: 4 mg via INTRAVENOUS

## 2012-08-07 MED ORDER — CELECOXIB 100 MG PO CAPS
400.0000 mg | ORAL_CAPSULE | Freq: Once | ORAL | Status: AC
  Administered 2012-08-07: 400 mg via ORAL

## 2012-08-07 MED ORDER — ROCURONIUM BROMIDE 100 MG/10ML IV SOLN
INTRAVENOUS | Status: DC | PRN
  Administered 2012-08-07: 30 mg via INTRAVENOUS
  Administered 2012-08-07: 10 mg via INTRAVENOUS

## 2012-08-07 MED ORDER — ROCURONIUM BROMIDE 50 MG/5ML IV SOLN
INTRAVENOUS | Status: AC
  Filled 2012-08-07: qty 1

## 2012-08-07 MED ORDER — OXYCODONE HCL 5 MG PO TABS
ORAL_TABLET | ORAL | Status: AC
  Filled 2012-08-07: qty 1

## 2012-08-07 MED ORDER — SUCCINYLCHOLINE CHLORIDE 20 MG/ML IJ SOLN
INTRAMUSCULAR | Status: DC | PRN
  Administered 2012-08-07: 140 mg via INTRAVENOUS

## 2012-08-07 MED ORDER — MIDAZOLAM HCL 2 MG/2ML IJ SOLN
1.0000 mg | INTRAMUSCULAR | Status: DC | PRN
  Administered 2012-08-07: 2 mg via INTRAVENOUS

## 2012-08-07 MED ORDER — SUFENTANIL CITRATE 50 MCG/ML IV SOLN
INTRAVENOUS | Status: DC | PRN
  Administered 2012-08-07: 20 ug via INTRAVENOUS
  Administered 2012-08-07 (×3): 10 ug via INTRAVENOUS

## 2012-08-07 MED ORDER — OXYCODONE-ACETAMINOPHEN 5-325 MG PO TABS: 1.0000 | ORAL_TABLET | ORAL | Status: DC | PRN

## 2012-08-07 MED ORDER — MIDAZOLAM HCL 2 MG/2ML IJ SOLN
INTRAMUSCULAR | Status: AC
  Filled 2012-08-07: qty 2

## 2012-08-07 MED ORDER — DEXAMETHASONE SODIUM PHOSPHATE 4 MG/ML IJ SOLN
INTRAMUSCULAR | Status: AC
  Filled 2012-08-07: qty 1

## 2012-08-07 MED ORDER — FENTANYL CITRATE 0.05 MG/ML IJ SOLN
INTRAMUSCULAR | Status: AC
  Filled 2012-08-07: qty 2

## 2012-08-07 MED ORDER — LACTATED RINGERS IV SOLN
INTRAVENOUS | Status: DC
  Administered 2012-08-07 (×2): via INTRAVENOUS

## 2012-08-07 MED ORDER — NEOSTIGMINE METHYLSULFATE 1 MG/ML IJ SOLN
INTRAMUSCULAR | Status: DC | PRN
  Administered 2012-08-07: 4 mg via INTRAVENOUS

## 2012-08-07 MED ORDER — PHENYLEPHRINE HCL 10 MG/ML IJ SOLN
INTRAMUSCULAR | Status: DC | PRN
  Administered 2012-08-07 (×4): 100 ug via INTRAVENOUS

## 2012-08-07 MED ORDER — CELECOXIB 100 MG PO CAPS
ORAL_CAPSULE | ORAL | Status: AC
  Filled 2012-08-07: qty 4

## 2012-08-07 MED ORDER — SUFENTANIL CITRATE 50 MCG/ML IV SOLN
INTRAVENOUS | Status: AC
  Filled 2012-08-07: qty 1

## 2012-08-07 MED ORDER — CHLORHEXIDINE GLUCONATE 4 % EX LIQD: 60.0000 mL | Freq: Once | CUTANEOUS | Status: DC

## 2012-08-07 MED ORDER — PROMETHAZINE HCL 12.5 MG PO TABS: 12.5000 mg | ORAL_TABLET | Freq: Four times a day (QID) | ORAL | Status: DC | PRN

## 2012-08-07 MED ORDER — LABETALOL HCL 5 MG/ML IV SOLN
INTRAVENOUS | Status: AC
  Filled 2012-08-07: qty 4

## 2012-08-07 MED ORDER — PROPOFOL 10 MG/ML IV EMUL
INTRAVENOUS | Status: AC
  Filled 2012-08-07: qty 20

## 2012-08-07 MED ORDER — FENTANYL CITRATE 0.05 MG/ML IJ SOLN: 25.0000 ug | INTRAMUSCULAR | Status: DC | PRN

## 2012-08-07 SURGICAL SUPPLY — 74 items
ANCHOR SUT 5.5 SPEEDSCREW (Screw) ×8 IMPLANT
BIT DRILL 2.0MX128MM (BIT) IMPLANT
BLADE HEX COATED 2.75 (ELECTRODE) ×4 IMPLANT
BLADE OSC/SAGITTAL MD 9X18.5 (BLADE) ×2 IMPLANT
BLADE SURG 15 STRL LF DISP TIS (BLADE) ×1 IMPLANT
BLADE SURG 15 STRL SS (BLADE) ×1
BLADE SURG SZ10 CARB STEEL (BLADE) ×2 IMPLANT
BNDG COHESIVE 4X5 TAN NS LF (GAUZE/BANDAGES/DRESSINGS) ×2 IMPLANT
BUR FAST CUTTING (BURR)
BUR ROUND 5.0 (BURR) IMPLANT
BUR SRG 54X4.7X12 FLUT (BURR) IMPLANT
BURR SRG 54X4.7X12 FLUT (BURR)
CHLORAPREP W/TINT 26ML (MISCELLANEOUS) ×2 IMPLANT
CLOTH BEACON ORANGE TIMEOUT ST (SAFETY) ×2 IMPLANT
COVER LIGHT HANDLE STERIS (MISCELLANEOUS) ×4 IMPLANT
COVER MAYO STAND XLG (DRAPE) ×2 IMPLANT
COVER PROBE W GEL 5X96 (DRAPES) ×2 IMPLANT
DRAPE ORTHO 2.5IN SPLIT 77X108 (DRAPES) ×2 IMPLANT
DRAPE ORTHO SPLIT 77X108 STRL (DRAPES) ×2
DRAPE PROXIMA HALF (DRAPES) ×2 IMPLANT
DRAPE U-SHAPE 47X51 STRL (DRAPES) ×2 IMPLANT
DRESSING ALLEVYN BORDER 5X5 (GAUZE/BANDAGES/DRESSINGS) ×2 IMPLANT
DRESSING ALLEVYN BORDER HEEL (GAUZE/BANDAGES/DRESSINGS) ×2 IMPLANT
ELECT REM PT RETURN 9FT ADLT (ELECTROSURGICAL) ×2
ELECTRODE REM PT RTRN 9FT ADLT (ELECTROSURGICAL) ×1 IMPLANT
FORMALIN 10 PREFIL 120ML (MISCELLANEOUS) ×2 IMPLANT
GLOVE BIOGEL PI IND STRL 7.0 (GLOVE) ×1 IMPLANT
GLOVE BIOGEL PI IND STRL 7.5 (GLOVE) ×1 IMPLANT
GLOVE BIOGEL PI IND STRL 8 (GLOVE) ×1 IMPLANT
GLOVE BIOGEL PI INDICATOR 7.0 (GLOVE) ×1
GLOVE BIOGEL PI INDICATOR 7.5 (GLOVE) ×1
GLOVE BIOGEL PI INDICATOR 8 (GLOVE) ×1
GLOVE ECLIPSE 7.0 STRL STRAW (GLOVE) ×2 IMPLANT
GLOVE SKINSENSE NS SZ8.0 LF (GLOVE)
GLOVE SKINSENSE STRL SZ8.0 LF (GLOVE) IMPLANT
GLOVE SS N UNI LF 8.5 STRL (GLOVE) ×2 IMPLANT
GOWN STRL REIN XL XLG (GOWN DISPOSABLE) ×6 IMPLANT
IMMOBILIZER SHOULDER LGE (ORTHOPEDIC SUPPLIES) IMPLANT
IMMOBILIZER SHOULDER MED (ORTHOPEDIC SUPPLIES) IMPLANT
IMMOBILIZER SHOULDER XLGE (ORTHOPEDIC SUPPLIES) ×2 IMPLANT
INST SET MINOR BONE (KITS) ×2 IMPLANT
KIT BLADEGUARD II DBL (SET/KITS/TRAYS/PACK) ×2 IMPLANT
KIT ROOM TURNOVER APOR (KITS) ×2 IMPLANT
MANIFOLD NEPTUNE II (INSTRUMENTS) ×2 IMPLANT
MARKER SKIN DUAL TIP RULER LAB (MISCELLANEOUS) ×2 IMPLANT
NEEDLE HYPO 21X1.5 SAFETY (NEEDLE) ×2 IMPLANT
NEEDLE MA TROC 1/2 (NEEDLE) IMPLANT
NEEDLE MAYO 6 CRC TAPER PT (NEEDLE) ×2 IMPLANT
NS IRRIG 1000ML POUR BTL (IV SOLUTION) ×2 IMPLANT
PACK BASIC III (CUSTOM PROCEDURE TRAY) ×1
PACK SRG BSC III STRL LF ECLPS (CUSTOM PROCEDURE TRAY) ×1 IMPLANT
PAD ARMBOARD 7.5X6 YLW CONV (MISCELLANEOUS) ×2 IMPLANT
PASSER SUT CAPTURE FIRST (SUTURE) IMPLANT
PENCIL HANDSWITCHING (ELECTRODE) ×4 IMPLANT
RASP SM TEAR CROSS CUT (RASP) IMPLANT
SET BASIN LINEN APH (SET/KITS/TRAYS/PACK) ×2 IMPLANT
SLING ARM FOAM STRAP LRG (SOFTGOODS) IMPLANT
SLING ARM FOAM STRAP MED (SOFTGOODS) IMPLANT
SLING ARM FOAM STRAP XLG (SOFTGOODS) IMPLANT
SPONGE LAP 18X18 X RAY DECT (DISPOSABLE) ×4 IMPLANT
STAPLER VISISTAT 35W (STAPLE) ×2 IMPLANT
STOCKINETTE IMPERVIOUS LG (DRAPES) ×2 IMPLANT
STRIP CLOSURE SKIN 1/2X4 (GAUZE/BANDAGES/DRESSINGS) IMPLANT
SUT BONE WAX W31G (SUTURE) IMPLANT
SUT ETHIBOND NAB OS 4 #2 30IN (SUTURE) ×4 IMPLANT
SUT ETHILON 3 0 FSL (SUTURE) IMPLANT
SUT FIBERWIRE #2 38 T-5 BLUE (SUTURE) ×6
SUT MON AB 0 CT1 (SUTURE) ×2 IMPLANT
SUT MON AB 2-0 CT1 36 (SUTURE) ×2 IMPLANT
SUT PROLENE 3 0 PS 1 (SUTURE) IMPLANT
SUTURE FIBERWR #2 38 T-5 BLUE (SUTURE) ×3 IMPLANT
SYR 30ML LL (SYRINGE) ×2 IMPLANT
SYR BULB IRRIGATION 50ML (SYRINGE) ×4 IMPLANT
YANKAUER SUCT 12FT TUBE ARGYLE (SUCTIONS) ×2 IMPLANT

## 2012-08-07 NOTE — Telephone Encounter (Signed)
Per call back from [facility], per Doneen Poisson, our clinic site manager, received the following information in regard to our request:  08/04/2012 8:10 AM Phone (Incoming) Linda/[facility] Returned call regarding on-line request for precertification of upcoming surgery - Per Linda/[facility] - no precert required.

## 2012-08-07 NOTE — Telephone Encounter (Signed)
As noted, per [employer], no pre-authorization required.

## 2012-08-07 NOTE — Anesthesia Preprocedure Evaluation (Signed)
Anesthesia Evaluation  Patient identified by MRN, date of birth, ID band Patient awake    Reviewed: Allergy & Precautions, H&P , NPO status , Patient's Chart, lab work & pertinent test results  Airway Mallampati: I TM Distance: >3 FB Neck ROM: Full    Dental  (+) Teeth Intact   Pulmonary Current Smoker,  breath sounds clear to auscultation        Cardiovascular hypertension, Pt. on medications Rhythm:Regular Rate:Normal     Neuro/Psych    GI/Hepatic negative GI ROS,   Endo/Other    Renal/GU      Musculoskeletal   Abdominal   Peds  Hematology   Anesthesia Other Findings   Reproductive/Obstetrics                           Anesthesia Physical Anesthesia Plan  ASA: II  Anesthesia Plan: General   Post-op Pain Management:    Induction: Intravenous  Airway Management Planned: Oral ETT  Additional Equipment:   Intra-op Plan:   Post-operative Plan: Extubation in OR  Informed Consent: I have reviewed the patients History and Physical, chart, labs and discussed the procedure including the risks, benefits and alternatives for the proposed anesthesia with the patient or authorized representative who has indicated his/her understanding and acceptance.     Plan Discussed with:   Anesthesia Plan Comments:         Anesthesia Quick Evaluation

## 2012-08-07 NOTE — Transfer of Care (Signed)
Immediate Anesthesia Transfer of Care Note  Patient: [name]  Procedure(s) Performed: Procedure(s): TENDON REPAIR PECTORALIS TENDON (Left)  Patient Location: PACU  Anesthesia Type:General  Level of Consciousness: sedated and patient cooperative  Airway & Oxygen Therapy: Patient Spontanous Breathing and Patient connected to face mask oxygen  Post-op Assessment: Report given to PACU RN and Post -op Vital signs reviewed and stable  Post vital signs: Reviewed and stable  Complications: NO apparent anesthesia complications

## 2012-08-07 NOTE — Interval H&P Note (Signed)
History and Physical Interval Note:  08/07/2012 8:23 AM  [name]  has presented today for surgery, with the diagnosis of pectoralis torn tendon left  The various methods of treatment have been discussed with the patient and family. After consideration of risks, benefits and other options for treatment, the patient has consented to  Procedure(s): TENDON REPAIR PECTORALIS TENDON (Left) as a surgical intervention .  The patient's history has been reviewed, patient examined, no change in status, stable for surgery.  I have reviewed the patient's chart and labs.  Questions were answered to the patient's satisfaction.     Fuller Canada

## 2012-08-07 NOTE — Brief Op Note (Signed)
08/07/2012  10:38 AM  PATIENT:  [name]  56 y.o. male  PRE-OPERATIVE DIAGNOSIS:  pectoralis torn tendon left  POST-OPERATIVE DIAGNOSIS:  pectoralis torn tendon left  Operative findings musculotendinous junction tear with 85% of the tendon insertion still intact   2 ArthroCare suture anchors were placed  Complications 2 suture anchors were left in place although 2 of the sutures pulled through the muscle   PROCEDURE:  Procedure(s): TENDON REPAIR PECTORALIS TENDON (Left)  Details of procedure The patient was identified in the preop area and the site was confirmed as marked as a left shoulder. Chart review was completed. Patient was taken to the operating room and given a weight-based dose of IV Ancef. After general anesthesia was placed in modified beachchair position with appropriate padding. The left arm and chest were then prepped and draped sterilely. Timeout was taken to confirm the procedure site the patient identification and measurements in place.  A modified deltopectoral incision was made starting just inferior to the coracoid and across the deltopectoral interval to the left proximal humerus. Subcutaneous tissue was divided electrocautery was used to control bleeding. The deltopectoral interval was identified including the cephalic vein which was retracted laterally. Expiration of the deltopectoral interval with finger dissection expose the torn musculotendinous junction. An attempt was then made to place 2 ArthroCare suture anchors into the bone along with 2 FiberWire sutures into the musculotendinous junction with attempt to repair the tendon back to bone.  However, because the rupture was more muscular than tenderness we placed a modified Mason-Allen sutures into the muscle tendon area and then reattach this to the tendon that was still connected to bone which was approximately 85%.  We externally rotated the arm 30 without any tension on the repair  We then  irrigated the wound and closed with 2 layers of 2-0 Monocryl and staples and injected 30 cc of 0.5% Marcaine with epinephrine. We placed a sterile dressing. We applied a shoulder immobilizer.  The patient was extubated taken recovery room in stable condition SURGEON:  Surgeon(s) and Role:    * Stanley E Harrison, MD - Primary  PHYSICIAN ASSISTANT:   ASSISTANTS: Wayne McFatter   ANESTHESIA:   general  EBL:  Total I/O In: 1400 [I.V.:1400] Out: 40 [Blood:40]  BLOOD ADMINISTERED:none  DRAINS: none   LOCAL MEDICATIONS USED:  MARCAINE 0.5% with epinephrine    and Amount: 30 ml  SPECIMEN:  No Specimen  DISPOSITION OF SPECIMEN:  N/A  COUNTS:  YES  TOURNIQUET:  * No tourniquets in log *  DICTATION: .Dragon Dictation  PLAN OF CARE: Discharge to home after PACU  PATIENT DISPOSITION:  PACU - hemodynamically stable.   Delay start of Pharmacological VTE agent (>24hrs) due to surgical blood loss or risk of bleeding: no  

## 2012-08-07 NOTE — Progress Notes (Signed)
Left fingers pink in color/warm to touch. Moves left fingers without difficulty. Left radial pulse palpable.

## 2012-08-07 NOTE — Anesthesia Postprocedure Evaluation (Signed)
  Anesthesia Post-op Note  Patient: [name]  Procedure(s) Performed: Procedure(s): TENDON REPAIR PECTORALIS TENDON (Left)  Patient Location: PACU  Anesthesia Type:General  Level of Consciousness: sedated and patient cooperative  Airway and Oxygen Therapy: Patient Spontanous Breathing and Patient connected to face mask oxygen  Post-op Pain: none  Post-op Assessment: Post-op Vital signs reviewed, Patient's Cardiovascular Status Stable, Respiratory Function Stable, Patent Airway, Adequate PO intake and Pain level controlled  Post-op Vital Signs: Reviewed and stable  Complications: No apparent anesthesia complications

## 2012-08-07 NOTE — Preoperative (Signed)
Beta Blockers   Reason not to administer Beta Blockers:Not Applicable 

## 2012-08-07 NOTE — Anesthesia Procedure Notes (Signed)
Procedure Name: Intubation Date/Time: 08/07/2012 8:56 AM Performed by: Carolyne Littles, AMY L Pre-anesthesia Checklist: Patient identified, Patient being monitored, Timeout performed, Emergency Drugs available and Suction available Patient Re-evaluated:Patient Re-evaluated prior to inductionOxygen Delivery Method: Circle System Utilized Preoxygenation: Pre-oxygenation with 100% oxygen Intubation Type: IV induction Ventilation: Mask ventilation without difficulty Laryngoscope Size: 3 and Miller Grade View: Grade I Tube type: Oral Tube size: 8.0 mm Number of attempts: 1 Airway Equipment and Method: stylet Placement Confirmation: ETT inserted through vocal cords under direct vision,  positive ETCO2 and breath sounds checked- equal and bilateral Secured at: 21 cm Tube secured with: Tape Dental Injury: Teeth and Oropharynx as per pre-operative assessment

## 2012-08-07 NOTE — Op Note (Signed)
08/07/2012  10:38 AM  PATIENT:  [name]  57 y.o. male  PRE-OPERATIVE DIAGNOSIS:  pectoralis torn tendon left  POST-OPERATIVE DIAGNOSIS:  pectoralis torn tendon left  Operative findings musculotendinous junction tear with 85% of the tendon insertion still intact   2 ArthroCare suture anchors were placed  Complications 2 suture anchors were left in place although 2 of the sutures pulled through the muscle   PROCEDURE:  Procedure(s): TENDON REPAIR PECTORALIS TENDON (Left)  Details of procedure The patient was identified in the preop area and the site was confirmed as marked as a left shoulder. Chart review was completed. Patient was taken to the operating room and given a weight-based dose of IV Ancef. After general anesthesia was placed in modified beachchair position with appropriate padding. The left arm and chest were then prepped and draped sterilely. Timeout was taken to confirm the procedure site the patient identification and measurements in place.  A modified deltopectoral incision was made starting just inferior to the coracoid and across the deltopectoral interval to the left proximal humerus. Subcutaneous tissue was divided electrocautery was used to control bleeding. The deltopectoral interval was identified including the cephalic vein which was retracted laterally. Expiration of the deltopectoral interval with finger dissection expose the torn musculotendinous junction. An attempt was then made to place 2 ArthroCare suture anchors into the bone along with 2 FiberWire sutures into the musculotendinous junction with attempt to repair the tendon back to bone.  However, because the rupture was more muscular than tenderness we placed a modified Mason-Allen sutures into the muscle tendon area and then reattach this to the tendon that was still connected to bone which was approximately 85%.  We externally rotated the arm 30 without any tension on the repair  We then  irrigated the wound and closed with 2 layers of 2-0 Monocryl and staples and injected 30 cc of 0.5% Marcaine with epinephrine. We placed a sterile dressing. We applied a shoulder immobilizer.  The patient was extubated taken recovery room in stable condition SURGEON:  Surgeon(s) and Role:    * Vickki Hearing, MD - Primary  PHYSICIAN ASSISTANT:   ASSISTANTS: Wayne McFatter   ANESTHESIA:   general  EBL:  Total I/O In: 1400 [I.V.:1400] Out: 40 [Blood:40]  BLOOD ADMINISTERED:none  DRAINS: none   LOCAL MEDICATIONS USED:  MARCAINE 0.5% with epinephrine    and Amount: 30 ml  SPECIMEN:  No Specimen  DISPOSITION OF SPECIMEN:  N/A  COUNTS:  YES  TOURNIQUET:  * No tourniquets in log *  DICTATION: .Dragon Dictation  PLAN OF CARE: Discharge to home after PACU  PATIENT DISPOSITION:  PACU - hemodynamically stable.   Delay start of Pharmacological VTE agent (>24hrs) due to surgical blood loss or risk of bleeding: no

## 2012-08-08 ENCOUNTER — Encounter (HOSPITAL_COMMUNITY): Payer: Self-pay | Admitting: Orthopedic Surgery

## 2012-08-09 ENCOUNTER — Encounter: Payer: Self-pay | Admitting: Orthopedic Surgery

## 2012-08-09 ENCOUNTER — Ambulatory Visit (INDEPENDENT_AMBULATORY_CARE_PROVIDER_SITE_OTHER): Payer: 59 | Admitting: Orthopedic Surgery

## 2012-08-09 VITALS — BP 150/98 | Ht 72.0 in | Wt 243.0 lb

## 2012-08-09 DIAGNOSIS — Z5189 Encounter for other specified aftercare: Secondary | ICD-10-CM

## 2012-08-09 DIAGNOSIS — S29011D Strain of muscle and tendon of front wall of thorax, subsequent encounter: Secondary | ICD-10-CM

## 2012-08-09 DIAGNOSIS — I10 Essential (primary) hypertension: Secondary | ICD-10-CM

## 2012-08-09 MED ORDER — CLONIDINE HCL 0.1 MG PO TABS: 0.1000 mg | ORAL_TABLET | Freq: Two times a day (BID) | ORAL | Status: DC

## 2012-08-09 NOTE — Progress Notes (Signed)
Patient ID: [name], male   DOB: [birth year], 57 y.o.   MRN: [id number] June 23 open and repair pectoralis  Wound looks clean the patient's blood pressure was elevated I called Dr. Renard Matter he advised clonidine 0.1 mg by mouth twice a day and to see him on Friday  I'll see him in 2 weeks for his staple removal and then we can start therapy he is in a sling he is comfortable he is doing well

## 2012-08-09 NOTE — Patient Instructions (Addendum)
Sling when walking around  No external rotation allowed  Blood Pressure:  See Dr. Renard Matter on Friday

## 2012-08-11 ENCOUNTER — Encounter: Payer: Self-pay | Admitting: Orthopedic Surgery

## 2012-08-21 ENCOUNTER — Ambulatory Visit (INDEPENDENT_AMBULATORY_CARE_PROVIDER_SITE_OTHER): Payer: 59 | Admitting: Orthopedic Surgery

## 2012-08-21 ENCOUNTER — Encounter: Payer: Self-pay | Admitting: Orthopedic Surgery

## 2012-08-21 VITALS — BP 134/100 | Ht 72.0 in | Wt 243.0 lb

## 2012-08-21 DIAGNOSIS — Z5189 Encounter for other specified aftercare: Secondary | ICD-10-CM

## 2012-08-21 DIAGNOSIS — S29011D Strain of muscle and tendon of front wall of thorax, subsequent encounter: Secondary | ICD-10-CM

## 2012-08-21 NOTE — Patient Instructions (Addendum)
Start therapy  Ok to shower  Remove plastic dressing in 3 days

## 2012-08-21 NOTE — Progress Notes (Signed)
Patient ID: [name], male   DOB: 1955-12-20, 57 y.o.   MRN: [id number] Chief Complaint  Patient presents with  . Follow-up    Post op 2 Tendon Repair DOS 08/07/12   BP 134/100  Ht 6' (1.829 m)  Wt 243 lb (110.224 kg)  BMI 32.95 kg/m2  Postop left pectoralis tendon repair.  The staples were removed from the skin the skin is intact clean and dry no erythema and no evidence of infection  The patient is advised on restriction of external rotation and forward elevation about 90  He can start physical therapy  Followup with me in 4 weeks

## 2012-08-22 ENCOUNTER — Other Ambulatory Visit: Payer: Self-pay | Admitting: *Deleted

## 2012-08-22 ENCOUNTER — Ambulatory Visit: Payer: 59 | Admitting: Orthopedic Surgery

## 2012-08-22 DIAGNOSIS — I10 Essential (primary) hypertension: Secondary | ICD-10-CM

## 2012-08-22 MED ORDER — CLONIDINE HCL 0.1 MG PO TABS: 0.1000 mg | ORAL_TABLET | Freq: Two times a day (BID) | ORAL | Status: AC

## 2012-08-24 ENCOUNTER — Telehealth: Payer: Self-pay | Admitting: Orthopedic Surgery

## 2012-08-24 ENCOUNTER — Ambulatory Visit (HOSPITAL_COMMUNITY)
Admission: RE | Admit: 2012-08-24 | Discharge: 2012-08-24 | Disposition: A | Discharge status: Home / Self Care | Payer: 59 | Source: Ambulatory Visit | Attending: Orthopedic Surgery | Admitting: Orthopedic Surgery

## 2012-08-24 DIAGNOSIS — M25519 Pain in unspecified shoulder: Secondary | ICD-10-CM | POA: Insufficient documentation

## 2012-08-24 DIAGNOSIS — IMO0001 Reserved for inherently not codable concepts without codable children: Secondary | ICD-10-CM | POA: Insufficient documentation

## 2012-08-24 DIAGNOSIS — M6289 Other specified disorders of muscle: Secondary | ICD-10-CM | POA: Insufficient documentation

## 2012-08-24 DIAGNOSIS — I10 Essential (primary) hypertension: Secondary | ICD-10-CM | POA: Insufficient documentation

## 2012-08-24 DIAGNOSIS — S29011D Strain of muscle and tendon of front wall of thorax, subsequent encounter: Secondary | ICD-10-CM

## 2012-08-24 DIAGNOSIS — M6281 Muscle weakness (generalized): Secondary | ICD-10-CM | POA: Insufficient documentation
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
## 2012-08-24 NOTE — Telephone Encounter (Signed)
Per Darliss Cheney at [facility] Therapy/Rehab department, for [name], Ph# [phone]/Fax# [phone], request for order to include occupational therapy.  Patient is there now for initial evaluation appointment.  Please advise.

## 2012-08-26 ENCOUNTER — Other Ambulatory Visit: Payer: Self-pay | Admitting: Orthopedic Surgery

## 2012-08-26 DIAGNOSIS — S29011D Strain of muscle and tendon of front wall of thorax, subsequent encounter: Secondary | ICD-10-CM

## 2012-08-29 ENCOUNTER — Ambulatory Visit (HOSPITAL_COMMUNITY)
Admission: RE | Admit: 2012-08-29 | Discharge: 2012-08-29 | Disposition: A | Discharge status: Home / Self Care | Payer: 59 | Source: Ambulatory Visit | Attending: Orthopedic Surgery | Admitting: Orthopedic Surgery

## 2012-08-29 DIAGNOSIS — M25512 Pain in left shoulder: Secondary | ICD-10-CM

## 2012-08-29 DIAGNOSIS — M6289 Other specified disorders of muscle: Secondary | ICD-10-CM

## 2012-08-29 NOTE — Progress Notes (Signed)
Occupational Therapy Treatment Patient Details  Name: [name] MRN: [id number] Date of Birth: October 09, 1955  Today's Date: 08/29/2012 Time: 4098-1191 OT Time Calculation (min): 35 min Manual Therapy [phone] 18' Therapeutic Exercises [phone] 17' Visit#: 2 of 16  Re-eval: 09/21/12    Subjective S:  I havent had much pain or any difficulties with the exercise. Limitations: PROM of all shoulder ranges through 7/21 limiting ER to 30.  7/21 begin AAROM and progress to AROM as tolerated 8/4-8/18. Pain Assessment Currently in Pain?: No/denies Pain Score: 0-No pain  Precautions/Restrictions     PROM of all shoulder ranges through 7/21 limiting ER to 30.  7/21 begin AAROM and progress to AROM as tolerated 8/4-8/18.  Exercise/Treatments Supine Protraction: PROM;10 reps External Rotation: PROM;10 reps;Limitations External Rotation Limitations: 30 degree limitation x 2 weeks Internal Rotation: PROM;10 reps Flexion: PROM;10 reps ABduction: PROM;10 reps Seated Elevation: AROM;10 reps Extension: AROM;10 reps Row: AROM;10 reps Therapy Ball Flexion: 20 reps ABduction: 20 reps ROM / Strengthening / Thumb Tacks: 1' Prot/Ret//Elev/Dep: 1'  Isometric Strengthening Flexion: Supine;3X3" Extension: Supine;3X3" External Rotation: Supine;3X3" Internal Rotation: Supine;3X3" ABduction: Supine;3X3" ADduction: Supine;3X3"      Manual Therapy Manual Therapy: Myofascial release Myofascial Release: MFR and manual stretching to left upper arm, shoulder, and scapular region.  Scar release to surgical scar region for decreased pain and fascial restrictions and increased pain free mobiilty within constraints of protocol.   Occupational Therapy Assessment and Plan OT Assessment and Plan Clinical Impression Statement: A:  Patient with moderate scar restrictions in surgical scar region.  Patient feels good results from scapular stability exercises (ext, elev, row). OT Plan: P:  Increase  contraction time with isometric strengthening to 5" X 5.   Goals Short Term Goals Time to Complete Short Term Goals: 4 weeks Short Term Goal 1: Patient will be educated on a HEP. Short Term Goal 1 Progress: Progressing toward goal Short Term Goal 2: Patient will increase PROM to WNL in left shoulder for greater ease with donning and doffing shirts. Short Term Goal 2 Progress: Progressing toward goal Short Term Goal 3: Patient's left shoulder strength will be assessed. Short Term Goal 3 Progress: Progressing toward goal Short Term Goal 4: Patient will decrease scar/fascial restrictions to minimal in his left shoulder region.  Short Term Goal 4 Progress: Progressing toward goal Short Term Goal 5: Patient will decrease pain to 1/10 in his left chest region when completing daily activiites.  Short Term Goal 5 Progress: Progressing toward goal Long Term Goals Time to Complete Long Term Goals: 8 weeks Long Term Goal 1: Patient will return to prior level of I with all B/IADLs, work, and leisure activities.  Long Term Goal 1 Progress: Progressing toward goal Long Term Goal 2: Patient will increase left shoulder AROM to WNL for increased ability to wash his back.  Long Term Goal 2 Progress: Progressing toward goal Long Term Goal 3: Patient will increase his left shoulder strength to 5/5 for increased ability to complete work duties.  Long Term Goal 3 Progress: Progressing toward goal Long Term Goal 4: Patient will decrease left chest pain to 0/10 during daily activities.  Long Term Goal 4 Progress: Progressing toward goal Long Term Goal 5: Patient will decrease left scar/fascial restrictions to trace.  Long Term Goal 5 Progress: Progressing toward goal  Problem List Patient Active Problem List   Diagnosis Date Noted  . Pain in joint, shoulder region 08/24/2012  . Muscle tightness 08/24/2012  . Pectoralis muscle rupture 07/20/2012  .  HIP, ARTHRITIS, DEGEN./OSTEO 04/26/2007  . SPINAL STENOSIS  04/26/2007    End of Session Activity Tolerance: Patient tolerated treatment well General Behavior During Therapy: [facility] for tasks assessed/performed  GO    Shirlean Mylar, OTR/L  08/29/2012, 10:15 AM

## 2012-08-31 ENCOUNTER — Ambulatory Visit (HOSPITAL_COMMUNITY)
Admission: RE | Admit: 2012-08-31 | Discharge: 2012-08-31 | Disposition: A | Discharge status: Home / Self Care | Payer: 59 | Source: Ambulatory Visit | Attending: Family Medicine | Admitting: Family Medicine

## 2012-08-31 DIAGNOSIS — M25512 Pain in left shoulder: Secondary | ICD-10-CM

## 2012-08-31 DIAGNOSIS — M6289 Other specified disorders of muscle: Secondary | ICD-10-CM

## 2012-08-31 NOTE — Progress Notes (Signed)
Occupational Therapy Treatment Patient Details  Name: [name] MRN: [id number] Date of Birth: November 05, 1955  Today's Date: 08/31/2012 Time: 4098-1191 OT Time Calculation (min): 28 min MFR 1122-1130 9' Therex 1130-1150 19'  Visit#: 3 of 16  Re-eval: 09/21/12    Authorization:    Authorization Time Period:    Authorization Visit#:   of    Subjective Pain Assessment Currently in Pain?: No/denies  Precautions/Restrictions  Precautions Precautions:  (limit ER to 30 PROM)  Exercise/Treatments Supine Protraction: PROM;10 reps External Rotation: PROM;10 reps;Limitations External Rotation Limitations: 30 degree limitation x 2 weeks Internal Rotation: PROM;10 reps Flexion: PROM;10 reps ABduction: PROM;10 reps Seated Elevation: AROM;12 reps Extension: AROM;12 reps Row: AROM;12 reps Therapy Ball Flexion: 20 reps ABduction: 20 reps ROM / Strengthening / Isometric Strengthening Thumb Tacks: 1' Prot/Ret//Elev/Dep: 1' Flexion: Supine;5X5" Extension: Supine;5X5" External Rotation: Supine;5X5" Internal Rotation: Supine;5X5" ABduction: Supine;5X5" ADduction: Supine;5X5"    Manual Therapy Manual Therapy: Myofascial release Myofascial Release: MFR and manual stretching to left upper arm, shoulder, and scapular region. Scar release to surgical scar region for decreased pain and fascial restrictions and increased pain free mobiilty within constraints of protocol.   Occupational Therapy Assessment and Plan OT Assessment and Plan Clinical Impression Statement: Patient has good PROM within precaution ranges. No pain noted. Increased isometric exercises to 5x5. Tolerated well.  OT Plan: P: Per protocol, start AAROM supine, progress to seated. Add pulleys, proximal shoulder strengtheing supine and seated.   Goals Short Term Goals Time to Complete Short Term Goals: 4 weeks Short Term Goal 1: Patient will be educated on a HEP. Short Term Goal 1 Progress: Progressing toward  goal Short Term Goal 2: Patient will increase PROM to WNL in left shoulder for greater ease with donning and doffing shirts. Short Term Goal 2 Progress: Progressing toward goal Short Term Goal 3: Patient's left shoulder strength will be assessed. Short Term Goal 3 Progress: Progressing toward goal Short Term Goal 4: Patient will decrease scar/fascial restrictions to minimal in his left shoulder region.  Short Term Goal 4 Progress: Progressing toward goal Short Term Goal 5: Patient will decrease pain to 1/10 in his left chest region when completing daily activiites.  Short Term Goal 5 Progress: Progressing toward goal Long Term Goals Time to Complete Long Term Goals: 8 weeks Long Term Goal 1: Patient will return to prior level of I with all B/IADLs, work, and leisure activities.  Long Term Goal 1 Progress: Progressing toward goal Long Term Goal 2: Patient will increase left shoulder AROM to WNL for increased ability to wash his back.  Long Term Goal 2 Progress: Progressing toward goal Long Term Goal 3: Patient will increase his left shoulder strength to 5/5 for increased ability to complete work duties.  Long Term Goal 3 Progress: Progressing toward goal Long Term Goal 4: Patient will decrease left chest pain to 0/10 during daily activities.  Long Term Goal 4 Progress: Progressing toward goal Long Term Goal 5: Patient will decrease left scar/fascial restrictions to trace.  Long Term Goal 5 Progress: Progressing toward goal  Problem List Patient Active Problem List   Diagnosis Date Noted  . Pain in joint, shoulder region 08/24/2012  . Muscle tightness 08/24/2012  . Pectoralis muscle rupture 07/20/2012  . HIP, ARTHRITIS, DEGEN./OSTEO 04/26/2007  . SPINAL STENOSIS 04/26/2007    End of Session Activity Tolerance: Patient tolerated treatment well General Behavior During Therapy: [facility] for tasks assessed/performed   Limmie Patricia, OTR/L,CBIS   08/31/2012, 12:01 PM

## 2012-09-05 ENCOUNTER — Ambulatory Visit (HOSPITAL_COMMUNITY)
Admission: RE | Admit: 2012-09-05 | Discharge: 2012-09-05 | Disposition: A | Discharge status: Home / Self Care | Payer: 59 | Source: Ambulatory Visit | Attending: Family Medicine | Admitting: Family Medicine

## 2012-09-05 DIAGNOSIS — M6289 Other specified disorders of muscle: Secondary | ICD-10-CM

## 2012-09-05 DIAGNOSIS — M25512 Pain in left shoulder: Secondary | ICD-10-CM

## 2012-09-05 NOTE — Progress Notes (Signed)
Occupational Therapy Treatment Patient Details  Name: [name] MRN: [id number] Date of Birth: 07/17/1955  Today's Date: 09/05/2012 Time: 4098-1191 OT Time Calculation (min): 41 min MFR 4782-9562 9' Therex 1308-6578 32'  Visit#: 4 of 16  Re-eval: 09/21/12    Authorization:    Authorization Time Period:    Authorization Visit#:   of    Subjective Symptoms/Limitations Symptoms: S: My shoulder didn't bother me driving at all.  Pain Assessment Currently in Pain?: No/denies  Precautions/Restrictions  Precautions Precautions:  (limit ER to 30 PROM)  Exercise/Treatments Supine Protraction: PROM;10 reps;AAROM;15 reps Horizontal ABduction: PROM;10 reps;AAROM;15 reps External Rotation: PROM;10 reps;AAROM;15 reps Internal Rotation: PROM;10 reps;AAROM;15 reps Flexion: PROM;10 reps;AAROM;15 reps ABduction: PROM;10 reps;AAROM;15 reps Seated Protraction: AAROM;10 reps Horizontal ABduction: AAROM;10 reps External Rotation: AAROM;10 reps Internal Rotation: AAROM;10 reps Flexion: AAROM;10 reps Abduction: AAROM;10 reps Pulleys Flexion: 1 minute ABduction: 1 minute      Manual Therapy Manual Therapy: Myofascial release Myofascial Release: MFR and manual stretching to left upper arm, shoulder, and scapular region. Scar release to surgical scar region for decreased pain and fascial restrictions and increased pain free mobiilty within constraints of protocol.   Occupational Therapy Assessment and Plan OT Assessment and Plan Clinical Impression Statement: A: Added AAROM supine and seated, and pulleys. Patient tolerated well. Fatigued during seated horizontal abduction and required rest breaks.  OT Plan: P:  Add proximal shoulder strengtheing  seated.   Goals Short Term Goals Time to Complete Short Term Goals: 4 weeks Short Term Goal 1: Patient will be educated on a HEP. Short Term Goal 1 Progress: Progressing toward goal Short Term Goal 2: Patient will increase PROM to  WNL in left shoulder for greater ease with donning and doffing shirts. Short Term Goal 2 Progress: Progressing toward goal Short Term Goal 3: Patient's left shoulder strength will be assessed. Short Term Goal 3 Progress: Progressing toward goal Short Term Goal 4: Patient will decrease scar/fascial restrictions to minimal in his left shoulder region.  Short Term Goal 4 Progress: Progressing toward goal Short Term Goal 5: Patient will decrease pain to 1/10 in his left chest region when completing daily activiites.  Short Term Goal 5 Progress: Progressing toward goal Long Term Goals Time to Complete Long Term Goals: 8 weeks Long Term Goal 1: Patient will return to prior level of I with all B/IADLs, work, and leisure activities.  Long Term Goal 1 Progress: Progressing toward goal Long Term Goal 2: Patient will increase left shoulder AROM to WNL for increased ability to wash his back.  Long Term Goal 2 Progress: Progressing toward goal Long Term Goal 3: Patient will increase his left shoulder strength to 5/5 for increased ability to complete work duties.  Long Term Goal 3 Progress: Progressing toward goal Long Term Goal 4: Patient will decrease left chest pain to 0/10 during daily activities.  Long Term Goal 4 Progress: Progressing toward goal Long Term Goal 5: Patient will decrease left scar/fascial restrictions to trace.  Long Term Goal 5 Progress: Progressing toward goal  Problem List Patient Active Problem List   Diagnosis Date Noted  . Pain in joint, shoulder region 08/24/2012  . Muscle tightness 08/24/2012  . Pectoralis muscle rupture 07/20/2012  . HIP, ARTHRITIS, DEGEN./OSTEO 04/26/2007  . SPINAL STENOSIS 04/26/2007    End of Session Activity Tolerance: Patient tolerated treatment well General Behavior During Therapy: [facility] for tasks assessed/performed   Limmie Patricia, OTR/L,CBIS   09/05/2012, 1:45 PM

## 2012-09-08 ENCOUNTER — Ambulatory Visit (HOSPITAL_COMMUNITY)
Admission: RE | Admit: 2012-09-08 | Discharge: 2012-09-08 | Disposition: A | Discharge status: Home / Self Care | Payer: 59 | Source: Ambulatory Visit | Attending: Orthopedic Surgery | Admitting: Orthopedic Surgery

## 2012-09-08 DIAGNOSIS — M6289 Other specified disorders of muscle: Secondary | ICD-10-CM

## 2012-09-08 DIAGNOSIS — M25512 Pain in left shoulder: Secondary | ICD-10-CM

## 2012-09-08 NOTE — Progress Notes (Signed)
Occupational Therapy Treatment Patient Details  Name: CHANSON TEEMS MRN: [id number] Date of Birth: 04/02/1955  Today's Date: 09/08/2012 Time: 4098-1191 OT Time Calculation (min): 41 min Manual Therapy [id number] 20' Therapeutic Exercises [phone] 21' Visit#: 5 of 16  Re-eval: 09/21/12    Subjective Symptoms/Limitations Symptoms: S:  I felt tired doing those exercises last time.  Limitations: PROM of all shoulder ranges through 7/21 limiting ER to 30.  7/21 begin AAROM and progress to AROM as tolerated 8/4-8/18. Pain Assessment Currently in Pain?: No/denies Pain Score: 0-No pain  Precautions/Restrictions    PROM of all shoulder ranges through 7/21 limiting ER to 30.  7/21 begin AAROM and progress to AROM as tolerated 8/4-8/18. Exercise/Treatments Supine Protraction: PROM;10 reps;AAROM;15 reps Horizontal ABduction: PROM;10 reps;AAROM;15 reps External Rotation: PROM;10 reps;AAROM;15 reps Internal Rotation: PROM;10 reps;AAROM;15 reps Flexion: PROM;10 reps;AAROM;15 reps ABduction: PROM;10 reps;AAROM;15 reps Seated Protraction: AAROM;15 reps Horizontal ABduction: AAROM;12 reps External Rotation: AAROM;12 reps Internal Rotation: AAROM;12 reps Flexion: AAROM;12 reps Abduction: AAROM;12 reps Therapy Ball Flexion: 20 reps ABduction: 20 reps Right/Left: 5 reps ROM / Strengthening / Isometric Strengthening Wall Wash: 2'      Manual Therapy Manual Therapy: Myofascial release Myofascial Release: MFR and manual stretching to left upper arm, shoulder, and scapular region. Scar release to surgical scar region for decreased pain and fascial restrictions and increased pain free mobiilty within constraints of protocol.  Occupational Therapy Assessment and Plan OT Assessment and Plan Clinical Impression Statement: A:  AAROM is WFL in seated and supine.  Added low wall wash.  Patient demonstrates good form with AAROM exercises.  OT Plan: P:  Add proximal shoulder strenghtening in  supine and and serratus anterior punch.  Reassess for MD visit 09/18/12.   Goals Short Term Goals Time to Complete Short Term Goals: 4 weeks Short Term Goal 1: Patient will be educated on a HEP. Short Term Goal 2: Patient will increase PROM to WNL in left shoulder for greater ease with donning and doffing shirts. Short Term Goal 3: Patient's left shoulder strength will be assessed. Short Term Goal 4: Patient will decrease scar/fascial restrictions to minimal in his left shoulder region.  Short Term Goal 5: Patient will decrease pain to 1/10 in his left chest region when completing daily activiites.  Long Term Goals Time to Complete Long Term Goals: 8 weeks Long Term Goal 1: Patient will return to prior level of I with all B/IADLs, work, and leisure activities.  Long Term Goal 2: Patient will increase left shoulder AROM to WNL for increased ability to wash his back.  Long Term Goal 3: Patient will increase his left shoulder strength to 5/5 for increased ability to complete work duties.  Long Term Goal 4: Patient will decrease left chest pain to 0/10 during daily activities.  Long Term Goal 5: Patient will decrease left scar/fascial restrictions to trace.   Problem List Patient Active Problem List   Diagnosis Date Noted  . Pain in joint, shoulder region 08/24/2012  . Muscle tightness 08/24/2012  . Pectoralis muscle rupture 07/20/2012  . HIP, ARTHRITIS, DEGEN./OSTEO 04/26/2007  . SPINAL STENOSIS 04/26/2007    End of Session Activity Tolerance: Patient tolerated treatment well General Behavior During Therapy: [facility] for tasks assessed/performed  Shirlean Mylar, OTR/L   09/08/2012, 11:12 AM

## 2012-09-11 ENCOUNTER — Ambulatory Visit (HOSPITAL_COMMUNITY)
Admission: RE | Admit: 2012-09-11 | Discharge: 2012-09-11 | Disposition: A | Discharge status: Home / Self Care | Payer: 59 | Source: Ambulatory Visit | Attending: Family Medicine | Admitting: Family Medicine

## 2012-09-11 DIAGNOSIS — M25512 Pain in left shoulder: Secondary | ICD-10-CM

## 2012-09-11 DIAGNOSIS — M6289 Other specified disorders of muscle: Secondary | ICD-10-CM
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
## 2012-09-14 ENCOUNTER — Ambulatory Visit (HOSPITAL_COMMUNITY): Payer: 59

## 2012-09-18 ENCOUNTER — Ambulatory Visit (INDEPENDENT_AMBULATORY_CARE_PROVIDER_SITE_OTHER): Payer: 59 | Admitting: Orthopedic Surgery

## 2012-09-18 ENCOUNTER — Encounter: Payer: Self-pay | Admitting: Orthopedic Surgery

## 2012-09-18 VITALS — BP 140/104 | Ht 72.0 in | Wt 243.0 lb

## 2012-09-18 DIAGNOSIS — S29011D Strain of muscle and tendon of front wall of thorax, subsequent encounter: Secondary | ICD-10-CM

## 2012-09-18 DIAGNOSIS — Z5189 Encounter for other specified aftercare: Secondary | ICD-10-CM

## 2012-09-18 NOTE — Progress Notes (Signed)
Patient ID: [name], male   DOB: 18-Feb-1955, 57 y.o.   MRN: [id number] Chief Complaint  Patient presents with  . Follow-up    1 month recheck post op tendon repair DOS 08/07/12    Week #6 status post pectoralis major tendon repair doing well no issues at this time tendon integrity confirmed by palpation against resistance. Full range of motion. Followup as needed no heavy lifting for 6 months

## 2012-09-19 ENCOUNTER — Ambulatory Visit (HOSPITAL_COMMUNITY): Payer: 59 | Admitting: Specialist

## 2012-09-21 ENCOUNTER — Ambulatory Visit (HOSPITAL_COMMUNITY): Payer: 59

## 2013-07-05 ENCOUNTER — Emergency Department (HOSPITAL_COMMUNITY)
Admission: EM | Admit: 2013-07-05 | Discharge: 2013-07-05 | Disposition: A | Discharge status: Home / Self Care | Payer: 59 | Attending: Emergency Medicine | Admitting: Emergency Medicine

## 2013-07-05 ENCOUNTER — Encounter (HOSPITAL_COMMUNITY): Payer: Self-pay | Admitting: Emergency Medicine

## 2013-07-05 DIAGNOSIS — R319 Hematuria, unspecified: Secondary | ICD-10-CM | POA: Insufficient documentation

## 2013-07-05 DIAGNOSIS — I10 Essential (primary) hypertension: Secondary | ICD-10-CM | POA: Insufficient documentation

## 2013-07-05 DIAGNOSIS — Z79899 Other long term (current) drug therapy: Secondary | ICD-10-CM | POA: Insufficient documentation

## 2013-07-05 DIAGNOSIS — Z87891 Personal history of nicotine dependence: Secondary | ICD-10-CM | POA: Insufficient documentation

## 2013-07-05 LAB — CBC WITH DIFFERENTIAL/PLATELET
BASOS ABS: 0 10*3/uL (ref 0.0–0.1)
BASOS PCT: 1 % (ref 0–1)
EOS ABS: 0 10*3/uL (ref 0.0–0.7)
EOS PCT: 0 % (ref 0–5)
HCT: 41 % (ref 39.0–52.0)
Hemoglobin: 13.2 g/dL (ref 13.0–17.0)
LYMPHS ABS: 1.8 10*3/uL (ref 0.7–4.0)
Lymphocytes Relative: 40 % (ref 12–46)
MCH: 29 pg (ref 26.0–34.0)
MCHC: 32.2 g/dL (ref 30.0–36.0)
MCV: 90.1 fL (ref 78.0–100.0)
Monocytes Absolute: 0.3 10*3/uL (ref 0.1–1.0)
Monocytes Relative: 7 % (ref 3–12)
NEUTROS PCT: 52 % (ref 43–77)
Neutro Abs: 2.4 10*3/uL (ref 1.7–7.7)
PLATELETS: 176 10*3/uL (ref 150–400)
RBC: 4.55 MIL/uL (ref 4.22–5.81)
RDW: 12.5 % (ref 11.5–15.5)
WBC: 4.4 10*3/uL (ref 4.0–10.5)

## 2013-07-05 LAB — BASIC METABOLIC PANEL
BUN: 15 mg/dL (ref 6–23)
CALCIUM: 9.7 mg/dL (ref 8.4–10.5)
CO2: 31 mEq/L (ref 19–32)
Chloride: 101 mEq/L (ref 96–112)
Creatinine, Ser: 1.43 mg/dL — ABNORMAL HIGH (ref 0.50–1.35)
GFR, EST AFRICAN AMERICAN: 61 mL/min — AB (ref >90)
GFR, EST NON AFRICAN AMERICAN: 53 mL/min — AB (ref >90)
GLUCOSE: 81 mg/dL (ref 70–99)
POTASSIUM: 4.4 meq/L (ref 3.7–5.3)
SODIUM: 141 meq/L (ref 137–147)

## 2013-07-05 LAB — URINALYSIS, ROUTINE W REFLEX MICROSCOPIC
BILIRUBIN URINE: NEGATIVE
Glucose, UA: NEGATIVE mg/dL
KETONES UR: NEGATIVE mg/dL
Leukocytes, UA: NEGATIVE
NITRITE: NEGATIVE
PROTEIN: NEGATIVE mg/dL
UROBILINOGEN UA: 1 mg/dL (ref 0.0–1.0)
pH: 5.5 (ref 5.0–8.0)

## 2013-07-05 LAB — URINE MICROSCOPIC-ADD ON

## 2013-07-05 NOTE — ED Notes (Signed)
Pt c/o hematuria that began this morning. Pt denies any pain.

## 2013-07-05 NOTE — ED Notes (Signed)
Hematuria , onset today, No dysuria

## 2013-07-05 NOTE — Discharge Instructions (Signed)
As discussed, your evaluation today has been largely reassuring.  But, it is important that you monitor your condition carefully, and do not hesitate to return to the ED if you develop new, or concerning changes in your condition.  Otherwise, please follow-up with your physician for appropriate ongoing care.  Hematuria, Adult Hematuria is blood in your urine. It can be caused by a bladder infection, kidney infection, prostate infection, kidney stone, or cancer of your urinary tract. Infections can usually be treated with medicine, and a kidney stone usually will pass through your urine. If neither of these is the cause of your hematuria, further workup to find out the reason may be needed. It is very important that you tell your health care provider about any blood you see in your urine, even if the blood stops without treatment or happens without causing pain. Blood in your urine that happens and then stops and then happens again can be a symptom of a very serious condition. Also, pain is not a symptom in the initial stages of many urinary cancers. HOME CARE INSTRUCTIONS   Drink lots of fluid, 3 4 quarts a day. If you have been diagnosed with an infection, cranberry juice is especially recommended, in addition to large amounts of water.  Avoid caffeine, tea, and carbonated beverages, because they tend to irritate the bladder.  Avoid alcohol because it may irritate the prostate.  Only take over-the-counter or prescription medicines for pain, discomfort, or fever as directed by your health care provider.  If you have been diagnosed with a kidney stone, follow your health care provider's instructions regarding straining your urine to catch the stone.  Empty your bladder often. Avoid holding urine for long periods of time.  After a bowel movement, women should cleanse front to back. Use each tissue only once.  Empty your bladder before and after sexual intercourse if you are a male. SEEK  MEDICAL CARE IF: You develop back pain, fever, a feeling of sickness in your stomach (nausea), or vomiting or if your symptoms are not better in 3 days. Return sooner if you are getting worse. SEEK IMMEDIATE MEDICAL CARE IF:   You have a persistent fever, with a temperature of 101.24F (38.8C) or greater.  You develop severe vomiting and are unable to keep the medicine down.  You develop severe back or abdominal pain despite taking your medicines.  You begin passing a large amount of blood or clots in your urine.  You feel extremely weak or faint, or you pass out. MAKE SURE YOU:   Understand these instructions.  Will watch your condition.  Will get help right away if you are not doing well or get worse. Document Released: 02/01/2005 Document Revised: 11/22/2012 Document Reviewed: 10/02/2012 [facility] Patient Information 2014 [place].
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
## 2013-07-06 LAB — URINE CULTURE
Colony Count: NO GROWTH
Culture: NO GROWTH

## 2013-07-16 ENCOUNTER — Ambulatory Visit (HOSPITAL_COMMUNITY)
Admission: RE | Admit: 2013-07-16 | Discharge: 2013-07-16 | Disposition: A | Discharge status: Home / Self Care | Payer: 59 | Source: Ambulatory Visit | Attending: Family Medicine | Admitting: Family Medicine

## 2013-07-16 ENCOUNTER — Other Ambulatory Visit (HOSPITAL_COMMUNITY): Payer: Self-pay | Admitting: Family Medicine

## 2013-07-16 DIAGNOSIS — X500XXA Overexertion from strenuous movement or load, initial encounter: Secondary | ICD-10-CM | POA: Insufficient documentation

## 2013-07-16 DIAGNOSIS — S59909A Unspecified injury of unspecified elbow, initial encounter: Secondary | ICD-10-CM | POA: Insufficient documentation

## 2013-07-16 DIAGNOSIS — M25521 Pain in right elbow: Secondary | ICD-10-CM

## 2013-07-16 DIAGNOSIS — S6990XA Unspecified injury of unspecified wrist, hand and finger(s), initial encounter: Principal | ICD-10-CM

## 2013-07-16 DIAGNOSIS — Y92838 Other recreation area as the place of occurrence of the external cause: Secondary | ICD-10-CM

## 2013-07-16 DIAGNOSIS — Y9239 Other specified sports and athletic area as the place of occurrence of the external cause: Secondary | ICD-10-CM | POA: Insufficient documentation

## 2013-07-16 DIAGNOSIS — S59919A Unspecified injury of unspecified forearm, initial encounter: Principal | ICD-10-CM

## 2013-07-23 ENCOUNTER — Ambulatory Visit (INDEPENDENT_AMBULATORY_CARE_PROVIDER_SITE_OTHER): Payer: 59 | Admitting: Orthopedic Surgery

## 2013-07-23 ENCOUNTER — Encounter: Payer: Self-pay | Admitting: Orthopedic Surgery

## 2013-07-23 VITALS — BP 178/115 | Ht 73.0 in | Wt 251.0 lb

## 2013-07-23 DIAGNOSIS — IMO0002 Reserved for concepts with insufficient information to code with codable children: Secondary | ICD-10-CM

## 2013-07-23 DIAGNOSIS — S46919A Strain of unspecified muscle, fascia and tendon at shoulder and upper arm level, unspecified arm, initial encounter: Secondary | ICD-10-CM

## 2013-07-23 DIAGNOSIS — M25529 Pain in unspecified elbow: Secondary | ICD-10-CM

## 2013-07-23 DIAGNOSIS — S56919A Strain of unspecified muscles, fascia and tendons at forearm level, unspecified arm, initial encounter: Secondary | ICD-10-CM

## 2013-07-23 NOTE — Progress Notes (Signed)
Patient ID: [name], male   DOB: [birth year], 58 y.o.   MRN: [id number]  Chief Complaint  Patient presents with  . Elbow Pain    Right elbow injury DOI 07/12/13. Referred by Dr. Everette Rank    BP 178/115  Ht 6\' 1"  (1.854 m)  Wt 251 lb (113.853 kg)  BMI 33.12 kg/m2  HISTORY:  36-year-old male presents with elbow pain for 2 weeks. The patient was having a procedure done for hematuria. During the procedure he clenched down on a pillow flex his elbow violently and felt pain in the posterior elbow. He sought medical attention and x-rays showed a fracture of the olecranon bone spur and soft tissue swelling. He complains of pain in the posterior elbow and in the triceps with occasional stiffness some aching. The pain is constant it is relieved by ibuprofen. He initially had some difficulty flexing and extending his arm but that has subsequently resolved  No allergies  Family history hypertension  He had a pectoralis muscle repair on the left I believe. He is on Diovan with hydrochlorothiazide  His review of systems is negative except for the hematuria which workup has negative to date  Vital signs: BP 178/115  Ht 6\' 1"  (1.854 m)  Wt 251 lb (113.853 kg)  BMI 33.12 kg/m2   General the patient is well-developed and well-nourished grooming and hygiene are normal Oriented x3 Mood and affect normal Ambulation normal Inspection of the right elbow, there is mild swelling in the posterior part of the elbow but no palpable defect in the tendon and he has equal strength in both triceps. He has a normal test for the biceps with normal strength. He has full passive and active range of motion. He has normal stability to the elbow joint. Motor exam is normal Skin clean dry and intact  Cardiovascular exam is normal Sensory exam normal  The x-ray shows a fracture of an olecranon spur chronicity and acuteness unclear but soft tissue swelling noted  Encounter Diagnoses  Name Primary?  . Elbow  pain   . 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

## 2014-03-11 ENCOUNTER — Emergency Department (HOSPITAL_COMMUNITY): Payer: 59

## 2014-03-11 ENCOUNTER — Emergency Department (HOSPITAL_COMMUNITY)
Admission: EM | Admit: 2014-03-11 | Discharge: 2014-03-11 | Disposition: A | Discharge status: Home / Self Care | Payer: 59 | Attending: Emergency Medicine | Admitting: Emergency Medicine

## 2014-03-11 ENCOUNTER — Encounter (HOSPITAL_COMMUNITY): Payer: Self-pay | Admitting: Emergency Medicine

## 2014-03-11 DIAGNOSIS — Z87891 Personal history of nicotine dependence: Secondary | ICD-10-CM | POA: Insufficient documentation

## 2014-03-11 DIAGNOSIS — Y9389 Activity, other specified: Secondary | ICD-10-CM | POA: Insufficient documentation

## 2014-03-11 DIAGNOSIS — Y998 Other external cause status: Secondary | ICD-10-CM | POA: Diagnosis not present

## 2014-03-11 DIAGNOSIS — W19XXXA Unspecified fall, initial encounter: Secondary | ICD-10-CM

## 2014-03-11 DIAGNOSIS — W01198A Fall on same level from slipping, tripping and stumbling with subsequent striking against other object, initial encounter: Secondary | ICD-10-CM | POA: Insufficient documentation

## 2014-03-11 DIAGNOSIS — Y9289 Other specified places as the place of occurrence of the external cause: Secondary | ICD-10-CM | POA: Insufficient documentation

## 2014-03-11 DIAGNOSIS — I10 Essential (primary) hypertension: Secondary | ICD-10-CM | POA: Insufficient documentation

## 2014-03-11 DIAGNOSIS — Z79899 Other long term (current) drug therapy: Secondary | ICD-10-CM | POA: Insufficient documentation

## 2014-03-11 DIAGNOSIS — S4991XA Unspecified injury of right shoulder and upper arm, initial encounter: Secondary | ICD-10-CM | POA: Diagnosis present

## 2014-03-11 DIAGNOSIS — S43014A Anterior dislocation of right humerus, initial encounter: Secondary | ICD-10-CM | POA: Insufficient documentation

## 2014-03-11 DIAGNOSIS — S43004A Unspecified dislocation of right shoulder joint, initial encounter: Secondary | ICD-10-CM

## 2014-03-11 MED ORDER — PROPOFOL 10 MG/ML IV BOLUS
0.5000 mg/kg | Freq: Once | INTRAVENOUS | Status: DC
Start: 2014-03-11 — End: 2014-03-11
  Filled 2014-03-11: qty 20

## 2014-03-11 MED ORDER — ONDANSETRON HCL 4 MG/2ML IJ SOLN
4.0000 mg | Freq: Once | INTRAMUSCULAR | Status: AC
  Administered 2014-03-11: 4 mg via INTRAVENOUS
  Filled 2014-03-11: qty 2

## 2014-03-11 MED ORDER — HYDROMORPHONE HCL 1 MG/ML IJ SOLN
1.0000 mg | Freq: Once | INTRAMUSCULAR | Status: AC
Start: 2014-03-11 — End: 2014-03-11
  Administered 2014-03-11: 1 mg via INTRAVENOUS
  Filled 2014-03-11: qty 1

## 2014-03-11 MED ORDER — HYDROMORPHONE HCL 1 MG/ML IJ SOLN
INTRAMUSCULAR | Status: AC
  Filled 2014-03-11: qty 1

## 2014-03-11 MED ORDER — HYDROMORPHONE HCL 1 MG/ML IJ SOLN
1.0000 mg | Freq: Once | INTRAMUSCULAR | Status: AC
  Administered 2014-03-11: 1 mg via INTRAVENOUS

## 2014-03-11 MED ORDER — OXYCODONE-ACETAMINOPHEN 5-325 MG PO TABS: 1.0000 | ORAL_TABLET | ORAL | Status: AC | PRN

## 2014-03-11 NOTE — ED Notes (Signed)
MD at bedside. 

## 2014-03-11 NOTE — ED Notes (Signed)
Pt and family member given something to drink
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
## 2014-03-11 NOTE — Discharge Instructions (Signed)

## 2014-03-11 NOTE — ED Notes (Signed)
14.4 ML of Propofol wasted in the sink. Verified by Brayton Layman, RN

## 2014-03-13 ENCOUNTER — Ambulatory Visit (INDEPENDENT_AMBULATORY_CARE_PROVIDER_SITE_OTHER): Payer: 59 | Admitting: Orthopedic Surgery

## 2014-03-13 ENCOUNTER — Encounter: Payer: Self-pay | Admitting: Orthopedic Surgery

## 2014-03-13 VITALS — BP 181/118 | Ht 73.0 in | Wt 245.0 lb

## 2014-03-13 DIAGNOSIS — S43004A Unspecified dislocation of right shoulder joint, initial encounter: Secondary | ICD-10-CM
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
## 2014-03-14 ENCOUNTER — Encounter: Payer: Self-pay | Admitting: Orthopedic Surgery

## 2014-03-19 ENCOUNTER — Telehealth: Payer: Self-pay | Admitting: Orthopedic Surgery

## 2014-03-19 NOTE — Telephone Encounter (Signed)
Patients wife is calling stating that [name] is complaining of a lot of pain in his right shoulder, ice nor ibuprofren is subsiding the pain. He is having tingling sensation in his fingers on the right side as well, please advise?

## 2014-03-19 NOTE — Telephone Encounter (Signed)
Routing to Dr Harrison 

## 2014-03-20 ENCOUNTER — Other Ambulatory Visit: Payer: Self-pay | Admitting: *Deleted

## 2014-03-20 MED ORDER — HYDROCODONE-ACETAMINOPHEN 10-325 MG PO TABS: 1.0000 | ORAL_TABLET | ORAL | Status: DC | PRN

## 2014-03-20 NOTE — Telephone Encounter (Signed)
Speak to the patient please   He probably needs to refill his medication   norco 10 q 4 42   Numbness is normal he has to wait for it to go away as discussed at his visit   See progress note

## 2014-03-20 NOTE — Telephone Encounter (Signed)
Patient aware.

## 2014-03-26 ENCOUNTER — Ambulatory Visit (INDEPENDENT_AMBULATORY_CARE_PROVIDER_SITE_OTHER): Payer: 59 | Admitting: Orthopedic Surgery

## 2014-03-26 ENCOUNTER — Encounter: Payer: Self-pay | Admitting: Orthopedic Surgery

## 2014-03-26 VITALS — BP 144/106 | Ht 73.0 in | Wt 245.0 lb

## 2014-03-26 DIAGNOSIS — S43004D Unspecified dislocation of right shoulder joint, subsequent encounter: Secondary | ICD-10-CM

## 2014-03-26 DIAGNOSIS — M75101 Unspecified rotator cuff tear or rupture of right shoulder, not specified as traumatic: Secondary | ICD-10-CM

## 2014-03-26 NOTE — Patient Instructions (Signed)
We will schedule MRI for you and call you with results

## 2014-03-26 NOTE — Progress Notes (Signed)
Chief Complaint  Patient presents with  . Follow-up    follow up Right shoulder fx/dislocation, DOI 03/11/14    Patient comes in 2 weeks after dislocating his right shoulder had a closed dislocation from a traumatic injury with a closed reduction. 2 weeks later he still cannot abduct  his arm still having severe pain.  System review no numbness or tingling related  Exam neurovascular exam is intact skin looks good motor exam shows weakness in abduction external rotation. Stability deferred because of position range of motion 30 external rotation active internal rotation from [place] elevation only 45 with weakness in the supraspinatus tendon and the external rotators palpable tenderness around the shoulder BP 144/106 mmHg  Ht 6\' 1"  (1.854 m)  Wt 245 lb (111.131 kg)  BMI 32.33 kg/m2 Stable vital signs   Previous x-ray show closed reduction congruent reduction   Recommend MRI to evaluate for rotator cuff tear in this 59 year old male status post shoulder dislocation  Encounter Diagnoses  Name Primary?  . Right rotator cuff tear Yes  . Shoulder dislocation, right, subsequent encounter

## 2014-04-04 ENCOUNTER — Ambulatory Visit (HOSPITAL_COMMUNITY)
Admission: RE | Admit: 2014-04-04 | Discharge: 2014-04-04 | Disposition: A | Discharge status: Home / Self Care | Payer: 59 | Source: Ambulatory Visit | Attending: Orthopedic Surgery | Admitting: Orthopedic Surgery

## 2014-04-04 DIAGNOSIS — M7551 Bursitis of right shoulder: Secondary | ICD-10-CM | POA: Diagnosis not present

## 2014-04-04 DIAGNOSIS — M25511 Pain in right shoulder: Secondary | ICD-10-CM | POA: Insufficient documentation

## 2014-04-04 DIAGNOSIS — S43491S Other sprain of right shoulder joint, sequela: Secondary | ICD-10-CM | POA: Insufficient documentation

## 2014-04-04 DIAGNOSIS — S43084S Other dislocation of right shoulder joint, sequela: Secondary | ICD-10-CM | POA: Insufficient documentation

## 2014-04-04 DIAGNOSIS — X58XXXS Exposure to other specified factors, sequela: Secondary | ICD-10-CM | POA: Insufficient documentation

## 2014-04-04 DIAGNOSIS — M75101 Unspecified rotator cuff tear or rupture of right shoulder, not specified as traumatic: Secondary | ICD-10-CM

## 2014-04-08 ENCOUNTER — Telehealth: Payer: Self-pay | Admitting: Orthopedic Surgery

## 2014-04-08 NOTE — Progress Notes (Signed)
Message left

## 2014-04-08 NOTE — Telephone Encounter (Signed)
Patient called to request results of MRI; his cell phone# is [phone]

## 2014-04-10 ENCOUNTER — Telehealth: Payer: Self-pay | Admitting: *Deleted

## 2014-04-10 ENCOUNTER — Other Ambulatory Visit: Payer: Self-pay | Admitting: *Deleted

## 2014-04-10 DIAGNOSIS — S43431S Superior glenoid labrum lesion of right shoulder, sequela: Secondary | ICD-10-CM

## 2014-04-10 NOTE — Telephone Encounter (Signed)
REFERRAL FAXED TO [facility]

## 2014-04-15 NOTE — Telephone Encounter (Signed)
Mr [name] is calling stating that [name] Alex Gardener office is stating that they have not received any referral for him and he is upset, please return call

## 2014-04-15 NOTE — Telephone Encounter (Signed)
REFERRAL REFAXED TO [name] AFTER THEY CONFIRMED THEY DID NOT RECEIVE  LEIGH ANN HAS SPOKEN TO PATIENT AND ADVISED REFERRAL RESENT AND ADVISED THEY WILL CONTACT HIM

## 2014-04-17 ENCOUNTER — Encounter (HOSPITAL_BASED_OUTPATIENT_CLINIC_OR_DEPARTMENT_OTHER): Payer: Self-pay | Admitting: *Deleted

## 2014-04-17 NOTE — Progress Notes (Signed)
   04/17/14 1535  OBSTRUCTIVE SLEEP APNEA  Have you ever been diagnosed with sleep apnea through a sleep study? No  Do you snore loudly (loud enough to be heard through closed doors)?  0  Do you often feel tired, fatigued, or sleepy during the daytime? 0  Has anyone observed you stop breathing during your sleep? 0  Do you have, or are you being treated for high blood pressure? 1  BMI more than 35 kg/m2? 0  Age over 60 years old? 1  Neck circumference greater than 40 cm/16 inches? 1  Gender: 1

## 2014-04-17 NOTE — Progress Notes (Signed)
Pt to go to [facility] for ekg-bmet-no cardiac or sleep apnea-wife RN at [facility]

## 2014-04-18 ENCOUNTER — Encounter (HOSPITAL_COMMUNITY)
Admission: RE | Admit: 2014-04-18 | Discharge: 2014-04-18 | Disposition: A | Discharge status: Home / Self Care | Payer: 59 | Source: Ambulatory Visit | Attending: Orthopedic Surgery | Admitting: Orthopedic Surgery

## 2014-04-18 ENCOUNTER — Ambulatory Visit: Payer: Self-pay | Admitting: Physician Assistant

## 2014-04-18 DIAGNOSIS — M19011 Primary osteoarthritis, right shoulder: Secondary | ICD-10-CM | POA: Diagnosis not present

## 2014-04-18 DIAGNOSIS — S43004A Unspecified dislocation of right shoulder joint, initial encounter: Secondary | ICD-10-CM | POA: Diagnosis not present

## 2014-04-18 DIAGNOSIS — I10 Essential (primary) hypertension: Secondary | ICD-10-CM | POA: Diagnosis not present

## 2014-04-18 DIAGNOSIS — Z87891 Personal history of nicotine dependence: Secondary | ICD-10-CM | POA: Diagnosis not present

## 2014-04-18 DIAGNOSIS — S43401A Unspecified sprain of right shoulder joint, initial encounter: Secondary | ICD-10-CM | POA: Diagnosis not present

## 2014-04-18 DIAGNOSIS — Z6832 Body mass index (BMI) 32.0-32.9, adult: Secondary | ICD-10-CM | POA: Diagnosis not present

## 2014-04-18 LAB — BASIC METABOLIC PANEL
Anion gap: 7 (ref 5–15)
BUN: 21 mg/dL (ref 6–23)
CO2: 26 mmol/L (ref 19–32)
Calcium: 9.5 mg/dL (ref 8.4–10.5)
Chloride: 104 mmol/L (ref 96–112)
Creatinine, Ser: 1.24 mg/dL (ref 0.50–1.35)
GFR, EST AFRICAN AMERICAN: 72 mL/min — AB (ref >90)
GFR, EST NON AFRICAN AMERICAN: 62 mL/min — AB (ref >90)
Glucose, Bld: 106 mg/dL — ABNORMAL HIGH (ref 70–99)
POTASSIUM: 4 mmol/L (ref 3.5–5.1)
SODIUM: 137 mmol/L (ref 135–145)
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
## 2014-04-19 ENCOUNTER — Ambulatory Visit (HOSPITAL_BASED_OUTPATIENT_CLINIC_OR_DEPARTMENT_OTHER)
Admission: RE | Admit: 2014-04-19 | Discharge: 2014-04-19 | Disposition: A | Discharge status: Home / Self Care | Payer: 59 | Source: Ambulatory Visit | Attending: Orthopedic Surgery | Admitting: Orthopedic Surgery

## 2014-04-19 ENCOUNTER — Ambulatory Visit (HOSPITAL_BASED_OUTPATIENT_CLINIC_OR_DEPARTMENT_OTHER): Payer: 59 | Admitting: Anesthesiology

## 2014-04-19 ENCOUNTER — Encounter (HOSPITAL_BASED_OUTPATIENT_CLINIC_OR_DEPARTMENT_OTHER)
Admission: RE | Disposition: A | Discharge status: Home / Self Care | Payer: Self-pay | Source: Ambulatory Visit | Attending: Orthopedic Surgery

## 2014-04-19 ENCOUNTER — Encounter (HOSPITAL_BASED_OUTPATIENT_CLINIC_OR_DEPARTMENT_OTHER): Payer: Self-pay | Admitting: *Deleted

## 2014-04-19 DIAGNOSIS — S43491A Other sprain of right shoulder joint, initial encounter: Secondary | ICD-10-CM

## 2014-04-19 DIAGNOSIS — M19011 Primary osteoarthritis, right shoulder: Secondary | ICD-10-CM | POA: Insufficient documentation

## 2014-04-19 DIAGNOSIS — S46011A Strain of muscle(s) and tendon(s) of the rotator cuff of right shoulder, initial encounter: Secondary | ICD-10-CM | POA: Diagnosis present

## 2014-04-19 DIAGNOSIS — Z6832 Body mass index (BMI) 32.0-32.9, adult: Secondary | ICD-10-CM | POA: Insufficient documentation

## 2014-04-19 DIAGNOSIS — S43004A Unspecified dislocation of right shoulder joint, initial encounter: Secondary | ICD-10-CM | POA: Insufficient documentation

## 2014-04-19 DIAGNOSIS — Z87891 Personal history of nicotine dependence: Secondary | ICD-10-CM | POA: Insufficient documentation

## 2014-04-19 DIAGNOSIS — I10 Essential (primary) hypertension: Secondary | ICD-10-CM | POA: Insufficient documentation

## 2014-04-19 DIAGNOSIS — S43431A Superior glenoid labrum lesion of right shoulder, initial encounter: Secondary | ICD-10-CM | POA: Diagnosis present

## 2014-04-19 DIAGNOSIS — S43401A Unspecified sprain of right shoulder joint, initial encounter: Secondary | ICD-10-CM | POA: Insufficient documentation

## 2014-04-19 HISTORY — DX: Other sprain of right shoulder joint, initial encounter: S43.491A

## 2014-04-19 HISTORY — DX: Strain of muscle(s) and tendon(s) of the rotator cuff of right shoulder, initial encounter: S46.011A

## 2014-04-19 HISTORY — PX: ARTHOSCOPIC ROTAOR CUFF REPAIR: SHX5002

## 2014-04-19 HISTORY — DX: Superior glenoid labrum lesion of right shoulder, initial encounter: S43.431A

## 2014-04-19 HISTORY — PX: SHOULDER ARTHROSCOPY WITH BANKART REPAIR: SHX5673

## 2014-04-19 LAB — POCT HEMOGLOBIN-HEMACUE: Hemoglobin: 14.3 g/dL (ref 13.0–17.0)

## 2014-04-19 SURGERY — SHOULDER ARTHROSCOPY WITH BANKART REPAIR
Anesthesia: General | Site: Shoulder | Laterality: Right

## 2014-04-19 MED ORDER — EPHEDRINE SULFATE 50 MG/ML IJ SOLN
INTRAMUSCULAR | Status: DC | PRN
  Administered 2014-04-19: 10 mg via INTRAVENOUS

## 2014-04-19 MED ORDER — MIDAZOLAM HCL 2 MG/2ML IJ SOLN
INTRAMUSCULAR | Status: AC
  Filled 2014-04-19: qty 2

## 2014-04-19 MED ORDER — CEFAZOLIN SODIUM-DEXTROSE 2-3 GM-% IV SOLR
2.0000 g | INTRAVENOUS | Status: AC
  Administered 2014-04-19: 2 g via INTRAVENOUS

## 2014-04-19 MED ORDER — SODIUM CHLORIDE 0.9 % IR SOLN
Status: DC | PRN
  Administered 2014-04-19: 23000 mL

## 2014-04-19 MED ORDER — FENTANYL CITRATE 0.05 MG/ML IJ SOLN
INTRAMUSCULAR | Status: AC
  Filled 2014-04-19: qty 2

## 2014-04-19 MED ORDER — HYDROMORPHONE HCL 1 MG/ML IJ SOLN: 0.2500 mg | INTRAMUSCULAR | Status: DC | PRN

## 2014-04-19 MED ORDER — ONDANSETRON HCL 4 MG/2ML IJ SOLN: 4.0000 mg | Freq: Once | INTRAMUSCULAR | Status: DC | PRN

## 2014-04-19 MED ORDER — ONDANSETRON HCL 4 MG/2ML IJ SOLN
INTRAMUSCULAR | Status: DC | PRN
  Administered 2014-04-19: 4 mg via INTRAVENOUS

## 2014-04-19 MED ORDER — SODIUM CHLORIDE 0.9 % IV SOLN: INTRAVENOUS | Status: DC

## 2014-04-19 MED ORDER — PROPOFOL 10 MG/ML IV BOLUS
INTRAVENOUS | Status: DC | PRN
  Administered 2014-04-19: 200 mg via INTRAVENOUS

## 2014-04-19 MED ORDER — BACLOFEN 10 MG PO TABS: 10.0000 mg | ORAL_TABLET | Freq: Three times a day (TID) | ORAL | Status: AC

## 2014-04-19 MED ORDER — OXYCODONE HCL 5 MG/5ML PO SOLN: 5.0000 mg | Freq: Once | ORAL | Status: DC | PRN

## 2014-04-19 MED ORDER — SENNA-DOCUSATE SODIUM 8.6-50 MG PO TABS: 2.0000 | ORAL_TABLET | Freq: Every day | ORAL | Status: AC

## 2014-04-19 MED ORDER — FENTANYL CITRATE 0.05 MG/ML IJ SOLN
50.0000 ug | INTRAMUSCULAR | Status: DC | PRN
  Administered 2014-04-19: 100 ug via INTRAVENOUS

## 2014-04-19 MED ORDER — DEXAMETHASONE SODIUM PHOSPHATE 4 MG/ML IJ SOLN
INTRAMUSCULAR | Status: DC | PRN
  Administered 2014-04-19: 10 mg via INTRAVENOUS

## 2014-04-19 MED ORDER — SUCCINYLCHOLINE CHLORIDE 20 MG/ML IJ SOLN
INTRAMUSCULAR | Status: DC | PRN
  Administered 2014-04-19: 160 mg via INTRAVENOUS

## 2014-04-19 MED ORDER — CEFAZOLIN SODIUM-DEXTROSE 2-3 GM-% IV SOLR
INTRAVENOUS | Status: AC
  Filled 2014-04-19: qty 50

## 2014-04-19 MED ORDER — BUPIVACAINE-EPINEPHRINE (PF) 0.5% -1:200000 IJ SOLN
INTRAMUSCULAR | Status: DC | PRN
  Administered 2014-04-19: 25 mL via PERINEURAL

## 2014-04-19 MED ORDER — PHENYLEPHRINE HCL 10 MG/ML IJ SOLN
INTRAMUSCULAR | Status: DC | PRN
  Administered 2014-04-19 (×2): 40 ug via INTRAVENOUS

## 2014-04-19 MED ORDER — LIDOCAINE HCL (CARDIAC) 20 MG/ML IV SOLN
INTRAVENOUS | Status: DC | PRN
  Administered 2014-04-19: 50 mg via INTRAVENOUS

## 2014-04-19 MED ORDER — CHLORHEXIDINE GLUCONATE 4 % EX LIQD: 60.0000 mL | Freq: Once | CUTANEOUS | Status: DC

## 2014-04-19 MED ORDER — MIDAZOLAM HCL 2 MG/2ML IJ SOLN
1.0000 mg | INTRAMUSCULAR | Status: DC | PRN
  Administered 2014-04-19: 2 mg via INTRAVENOUS

## 2014-04-19 MED ORDER — ONDANSETRON HCL 4 MG PO TABS: 4.0000 mg | ORAL_TABLET | Freq: Three times a day (TID) | ORAL | Status: DC | PRN

## 2014-04-19 MED ORDER — PHENYLEPHRINE HCL 10 MG/ML IJ SOLN
10.0000 mg | INTRAMUSCULAR | Status: DC | PRN
  Administered 2014-04-19: 40 ug/min via INTRAVENOUS

## 2014-04-19 MED ORDER — GLYCOPYRROLATE 0.2 MG/ML IJ SOLN
INTRAMUSCULAR | Status: DC | PRN
  Administered 2014-04-19: 0.2 mg via INTRAVENOUS

## 2014-04-19 MED ORDER — LACTATED RINGERS IV SOLN
INTRAVENOUS | Status: DC
  Administered 2014-04-19 (×2): via INTRAVENOUS

## 2014-04-19 MED ORDER — MIDAZOLAM HCL 5 MG/5ML IJ SOLN
INTRAMUSCULAR | Status: DC | PRN
  Administered 2014-04-19: 2 mg via INTRAVENOUS

## 2014-04-19 MED ORDER — OXYCODONE HCL 5 MG PO TABS: 5.0000 mg | ORAL_TABLET | Freq: Once | ORAL | Status: DC | PRN

## 2014-04-19 MED ORDER — FENTANYL CITRATE 0.05 MG/ML IJ SOLN
INTRAMUSCULAR | Status: AC
  Filled 2014-04-19: qty 6

## 2014-04-19 MED ORDER — FENTANYL CITRATE 0.05 MG/ML IJ SOLN
INTRAMUSCULAR | Status: DC | PRN
  Administered 2014-04-19 (×3): 50 ug via INTRAVENOUS

## 2014-04-19 SURGICAL SUPPLY — 99 items
ANCHOR SUT BIO SW 4.75X19.1 (Anchor) ×3 IMPLANT
ANCHOR SUT BIOCOMP LK 2.9X12.5 (Anchor) ×6 IMPLANT
BENZOIN TINCTURE PRP APPL 2/3 (GAUZE/BANDAGES/DRESSINGS) IMPLANT
BLADE 4.2CUDA (BLADE) IMPLANT
BLADE CUTTER GATOR 3.5 (BLADE) ×3 IMPLANT
BLADE SURG 10 STRL SS (BLADE) IMPLANT
BLADE SURG 15 STRL LF DISP TIS (BLADE) IMPLANT
BLADE SURG 15 STRL SS (BLADE)
BLADE VORTEX 6.0 (BLADE) IMPLANT
BUR 3.5 LG SPHERICAL (BURR) IMPLANT
BUR VERTEX HOODED 4.5 (BURR) IMPLANT
BURR 3.5 LG SPHERICAL (BURR)
BURR 3.5MM LG SPHERICAL (BURR)
CANNULA 5.75X71 LONG (CANNULA) ×6 IMPLANT
CANNULA SHOULDER 7CM (CANNULA) IMPLANT
CANNULA TWIST IN 8.25X7CM (CANNULA) ×3 IMPLANT
CLEANER CAUTERY TIP 5X5 PAD (MISCELLANEOUS) IMPLANT
CLOSURE WOUND 1/2 X4 (GAUZE/BANDAGES/DRESSINGS)
CUTTER MENISCUS  4.2MM (BLADE)
CUTTER MENISCUS 4.2MM (BLADE) IMPLANT
DECANTER SPIKE VIAL GLASS SM (MISCELLANEOUS) IMPLANT
DRAPE INCISE IOBAN 66X45 STRL (DRAPES) ×3 IMPLANT
DRAPE SHOULDER BEACH CHAIR (DRAPES) ×3 IMPLANT
DRAPE SURG 17X23 STRL (DRAPES) IMPLANT
DRAPE U-SHAPE 47X51 STRL (DRAPES) ×3 IMPLANT
DRSG EMULSION OIL 3X3 NADH (GAUZE/BANDAGES/DRESSINGS) IMPLANT
DRSG PAD ABDOMINAL 8X10 ST (GAUZE/BANDAGES/DRESSINGS) ×3 IMPLANT
DURAPREP 26ML APPLICATOR (WOUND CARE) ×3 IMPLANT
ELECT REM PT RETURN 9FT ADLT (ELECTROSURGICAL) ×3
ELECTRODE REM PT RTRN 9FT ADLT (ELECTROSURGICAL) ×1 IMPLANT
GAUZE SPONGE 4X4 12PLY STRL (GAUZE/BANDAGES/DRESSINGS) ×3 IMPLANT
GLOVE BIO SURGEON STRL SZ7.5 (GLOVE) IMPLANT
GLOVE BIO SURGEON STRL SZ8 (GLOVE) ×3 IMPLANT
GLOVE BIOGEL M STRL SZ7.5 (GLOVE) ×3 IMPLANT
GLOVE BIOGEL PI IND STRL 7.0 (GLOVE) ×2 IMPLANT
GLOVE BIOGEL PI IND STRL 8 (GLOVE) ×3 IMPLANT
GLOVE BIOGEL PI IND STRL 8.5 (GLOVE) ×1 IMPLANT
GLOVE BIOGEL PI INDICATOR 7.0 (GLOVE) ×4
GLOVE BIOGEL PI INDICATOR 8 (GLOVE) ×6
GLOVE BIOGEL PI INDICATOR 8.5 (GLOVE) ×2
GLOVE ECLIPSE 6.5 STRL STRAW (GLOVE) ×3 IMPLANT
GLOVE ORTHO TXT STRL SZ7.5 (GLOVE) ×3 IMPLANT
GLOVE SURG ORTHO 8.0 STRL STRW (GLOVE) ×3 IMPLANT
GOWN STRL REUS W/ TWL LRG LVL3 (GOWN DISPOSABLE) ×1 IMPLANT
GOWN STRL REUS W/ TWL XL LVL3 (GOWN DISPOSABLE) ×4 IMPLANT
GOWN STRL REUS W/TWL LRG LVL3 (GOWN DISPOSABLE) ×2
GOWN STRL REUS W/TWL XL LVL3 (GOWN DISPOSABLE) ×8
IMMOBILIZER SHOULDER FOAM XLGE (SOFTGOODS) ×3 IMPLANT
IV NS IRRIG 3000ML ARTHROMATIC (IV SOLUTION) ×24 IMPLANT
KIT PUSHLOCK 2.9 HIP (KITS) ×3 IMPLANT
KIT SHOULDER TRACTION (DRAPES) ×3 IMPLANT
LASSO 90 CVE QUICKPAS (DISPOSABLE) ×3 IMPLANT
LASSO SUT 90 DEGREE (SUTURE) IMPLANT
MANIFOLD NEPTUNE II (INSTRUMENTS) ×3 IMPLANT
NEEDLE 1/2 CIR CATGUT .05X1.09 (NEEDLE) IMPLANT
NEEDLE SCORPION MULTI FIRE (NEEDLE) ×3 IMPLANT
NS IRRIG 1000ML POUR BTL (IV SOLUTION) IMPLANT
PACK ARTHROSCOPY DSU (CUSTOM PROCEDURE TRAY) ×3 IMPLANT
PACK BASIN DAY SURGERY FS (CUSTOM PROCEDURE TRAY) ×3 IMPLANT
PAD CLEANER CAUTERY TIP 5X5 (MISCELLANEOUS)
PAD ORTHO SHOULDER 7X19 LRG (SOFTGOODS) IMPLANT
PENCIL BUTTON HOLSTER BLD 10FT (ELECTRODE) IMPLANT
SET ARTHROSCOPY TUBING (MISCELLANEOUS) ×2
SET ARTHROSCOPY TUBING LN (MISCELLANEOUS) ×1 IMPLANT
SHEET MEDIUM DRAPE 40X70 STRL (DRAPES) ×3 IMPLANT
SLEEVE SCD COMPRESS KNEE MED (MISCELLANEOUS) ×3 IMPLANT
SLING ARM LRG ADULT FOAM STRAP (SOFTGOODS) IMPLANT
SLING ARM MED ADULT FOAM STRAP (SOFTGOODS) IMPLANT
SLING ULTRA II MEDIUM (SOFTGOODS) IMPLANT
SLING ULTRA II SMALL (SOFTGOODS) IMPLANT
SPONGE LAP 4X18 X RAY DECT (DISPOSABLE) IMPLANT
STAPLER VISISTAT 35W (STAPLE) IMPLANT
STRIP CLOSURE SKIN 1/2X4 (GAUZE/BANDAGES/DRESSINGS) IMPLANT
SUCTION FRAZIER TIP 10 FR DISP (SUCTIONS) IMPLANT
SUT BONE WAX W31G (SUTURE) IMPLANT
SUT ETHIBOND 2 OS 4 DA (SUTURE) IMPLANT
SUT ETHILON 4 0 PS 2 18 (SUTURE) IMPLANT
SUT FIBERWIRE #2 38 T-5 BLUE (SUTURE) ×3
SUT LASSO 45 DEGREE LEFT (SUTURE) IMPLANT
SUT LASSO 45D RIGHT (SUTURE) IMPLANT
SUT MNCRL AB 4-0 PS2 18 (SUTURE) ×3 IMPLANT
SUT PROLENE 3 0 PS 2 (SUTURE) IMPLANT
SUT TICRON 1 T 12 (SUTURE) IMPLANT
SUT TIGER TAPE 7 IN WHITE (SUTURE) ×3 IMPLANT
SUT VIC AB 0 CT1 27 (SUTURE)
SUT VIC AB 0 CT1 27XBRD ANBCTR (SUTURE) IMPLANT
SUT VIC AB 1 CT1 27 (SUTURE)
SUT VIC AB 1 CT1 27XBRD ANBCTR (SUTURE) IMPLANT
SUT VIC AB 2-0 PS2 27 (SUTURE) IMPLANT
SUT VIC AB 2-0 SH 27 (SUTURE)
SUT VIC AB 2-0 SH 27XBRD (SUTURE) IMPLANT
SUTURE FIBERWR #2 38 T-5 BLUE (SUTURE) ×1 IMPLANT
TAPE LABRALWHITE 1.5X36 (TAPE) ×3 IMPLANT
TAPE SUT LABRALTAP WHT/BLK (SUTURE) ×3 IMPLANT
TOWEL OR 17X24 6PK STRL BLUE (TOWEL DISPOSABLE) ×3 IMPLANT
TOWEL OR NON WOVEN STRL DISP B (DISPOSABLE) ×3 IMPLANT
WAND STAR VAC 90 (SURGICAL WAND) ×3 IMPLANT
WATER STERILE IRR 1000ML POUR (IV SOLUTION) ×3 IMPLANT
YANKAUER SUCT BULB TIP NO VENT (SUCTIONS) IMPLANT

## 2014-04-19 NOTE — Transfer of Care (Signed)
Immediate Anesthesia Transfer of Care Note  Patient: [name]  Procedure(s) Performed: Procedure(s): RIGHT SHOULDER ARTHROSCOPY DEBRIDEMENT WITH Sharlet Salina AND ROTATOR CUFF REPAIR  (Right) ARTHROSCOPIC ROTATOR CUFF REPAIR (Right)  Patient Location: PACU  Anesthesia Type:General and Regional  Level of Consciousness: sedated  Airway & Oxygen Therapy: Patient Spontanous Breathing and Patient connected to face mask oxygen  Post-op Assessment: Report given to RN and Post -op Vital signs reviewed and stable  Post vital signs: Reviewed and stable  Last Vitals:  Filed Vitals:   04/19/14 0915  BP: 153/87  Pulse: 80  Temp:   Resp: 25    Complications: No apparent anesthesia complications

## 2014-04-19 NOTE — Anesthesia Preprocedure Evaluation (Addendum)
Anesthesia Evaluation  Patient identified by MRN, date of birth, ID band Patient awake    Reviewed: Allergy & Precautions, NPO status , Patient's Chart, lab work & pertinent test results  Airway Mallampati: I  TM Distance: >3 FB Neck ROM: Full    Dental  (+) Teeth Intact, Dental Advisory Given   Pulmonary former smoker,  breath sounds clear to auscultation        Cardiovascular hypertension, Pt. on medications Rhythm:Regular     Neuro/Psych    GI/Hepatic   Endo/Other  Morbid obesity  Renal/GU      Musculoskeletal   Abdominal   Peds  Hematology   Anesthesia Other Findings   Reproductive/Obstetrics                          Anesthesia Physical Anesthesia Plan  ASA: II  Anesthesia Plan: General   Post-op Pain Management:    Induction: Intravenous  Airway Management Planned:   Additional Equipment:   Intra-op Plan:   Post-operative Plan: Extubation in OR  Informed Consent: I have reviewed the patients History and Physical, chart, labs and discussed the procedure including the risks, benefits and alternatives for the proposed anesthesia with the patient or authorized representative who has indicated his/her understanding and acceptance.   Dental advisory given  Plan Discussed with: CRNA, Anesthesiologist and Surgeon  Anesthesia Plan Comments:         Anesthesia Quick Evaluation
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
## 2014-04-19 NOTE — Progress Notes (Signed)
Assisted Dr. Crews with right, ultrasound guided, interscalene  block. Side rails up, monitors on throughout procedure. See vital signs in flow sheet. Tolerated Procedure well. 

## 2014-04-19 NOTE — Anesthesia Procedure Notes (Addendum)
Anesthesia Regional Block:  Interscalene brachial plexus block  Pre-Anesthetic Checklist: ,, timeout performed, Correct Patient, Correct Site, Correct Laterality, Correct Procedure, Correct Position, site marked, Risks and benefits discussed,  Surgical consent,  Pre-op evaluation,  At surgeon's request and post-op pain management  Laterality: Right and Upper  Prep: chloraprep       Needles:  Injection technique: Single-shot  Needle Type: Echogenic Needle     Needle Length: 5cm 5 cm Needle Gauge: 21 and 21 G    Additional Needles:  Procedures: ultrasound guided (picture in chart) Interscalene brachial plexus block Narrative:  Start time: 04/19/2014 8:35 AM End time: 04/19/2014 8:41 AM Injection made incrementally with aspirations every 5 mL.  Performed by: Personally  Anesthesiologist: CREWS, DAVID A   Procedure Name: Intubation Date/Time: 04/19/2014 10:09 AM Performed by: Melynda Ripple D Pre-anesthesia Checklist: Patient identified, Emergency Drugs available, Suction available and Patient being monitored Patient Re-evaluated:Patient Re-evaluated prior to inductionOxygen Delivery Method: Circle System Utilized Preoxygenation: Pre-oxygenation with 100% oxygen Intubation Type: IV induction Ventilation: Mask ventilation without difficulty Laryngoscope Size: Mac and 3 Grade View: Grade II Tube type: Oral Tube size: 7.0 mm Number of attempts: 1 Airway Equipment and Method: Stylet and Oral airway Placement Confirmation: ETT inserted through vocal cords under direct vision,  positive ETCO2 and breath sounds checked- equal and bilateral Secured at: 23 cm Tube secured with: Tape Dental Injury: Teeth and Oropharynx as per pre-operative assessment

## 2014-04-19 NOTE — Progress Notes (Signed)
The patient has been re-examined, and the chart reviewed, and there have been no interval changes to the documented history and physical.  He did report some numbness that has been persistent since the dislocation in the right upper extremity. He is currently block, and has diffuse numbness in the upper extremity.  The risks, benefits, and alternatives have been discussed at length, and the patient is willing to proceed.    We are planning for right shoulder arthroscopy, labral repair, rotator cuff repair, possible acromioplasty depending on operative findings.  Johnny Bridge, MD

## 2014-04-19 NOTE — Discharge Instructions (Signed)
Diet: As you were doing prior to hospitalization   Shower:  May shower but keep the wounds dry, use an occlusive plastic wrap, NO SOAKING IN TUB.  If the bandage gets wet, change with a clean dry gauze.  Dressing:  You may change your dressing 3-5 days after surgery.  Then change the dressing daily with sterile gauze dressing.    There are sticky tapes (steri-strips) on your wounds and all the stitches are absorbable.  Leave the steri-strips in place when changing your dressings, they will peel off with time, usually 2-3 weeks.  Activity:  Increase activity slowly as tolerated, but follow the weight bearing instructions below.  No lifting or driving for 6 weeks.  Weight Bearing:   Sling at all times..    To prevent constipation: you may use a stool softener such as -  Colace (over the counter) 100 mg by mouth twice a day  Drink plenty of fluids (prune juice may be helpful) and high fiber foods Miralax (over the counter) for constipation as needed.    Itching:  If you experience itching with your medications, try taking only a single pain pill, or even half a pain pill at a time.  You may take up to 10 pain pills per day, and you can also use benadryl over the counter for itching or also to help with sleep.   Precautions:  If you experience chest pain or shortness of breath - call 911 immediately for transfer to the hospital emergency department!!  If you develop a fever greater that 101 F, purulent drainage from wound, increased redness or drainage from wound, or calf pain -- Call the office at [phone]                                                Follow- Up Appointment:  Please call for an appointment to be seen in 2 weeks [place] - [phone]  Regional Anesthesia Blocks  1. Numbness or the inability to move the "blocked" extremity may last from 3-48 hours after placement. The length of time depends on the medication injected and your individual response to the medication. If  the numbness is not going away after 48 hours, call your surgeon.  2. The extremity that is blocked will need to be protected until the numbness is gone and the  Strength has returned. Because you cannot feel it, you will need to take extra care to avoid injury. Because it may be weak, you may have difficulty moving it or using it. You may not know what position it is in without looking at it while the block is in effect.  3. For blocks in the legs and feet, returning to weight bearing and walking needs to be done carefully. You will need to wait until the numbness is entirely gone and the strength has returned. You should be able to move your leg and foot normally before you try and bear weight or walk. You will need someone to be with you when you first try to ensure you do not fall and possibly risk injury.  4. Bruising and tenderness at the needle site are common side effects and will resolve in a few days.  5. Persistent numbness or new problems with movement should be communicated to the surgeon or the [facility] [phone] [place] [phone]).  Post Anesthesia Home Care Instructions  Activity: Get plenty of rest for the remainder of the day. A responsible adult should stay with you for 24 hours following the procedure.  For the next 24 hours, DO NOT: -Drive a car -Paediatric nurse -Drink alcoholic beverages -Take any medication unless instructed by your physician -Make any legal decisions or sign important papers.  Meals: Start with liquid foods such as gelatin or soup. Progress to regular foods as tolerated. Avoid greasy, spicy, heavy foods. If nausea and/or vomiting occur, drink only clear liquids until the nausea and/or vomiting subsides. Call your physician if vomiting continues.  Special Instructions/Symptoms: Your throat may feel dry or sore from the anesthesia or the breathing tube placed in your throat during surgery. If this causes  discomfort, gargle with warm salt water. The discomfort should disappear within 24 hours.

## 2014-04-19 NOTE — Interval H&P Note (Signed)
History and Physical Interval Note:  04/19/2014 7:17 AM  [name]  has presented today for surgery, with the diagnosis of RIGHT SHOULDER IMPINGEMENT ROTOTAR CUFF TEAR   The various methods of treatment have been discussed with the patient and family. After consideration of risks, benefits and other options for treatment, the patient has consented to  Procedure(s): RIGHT SHOULDER ARTHROSCOPY DEBRIDEMENT WITH Sharlet Salina AND ROTATOR CUFF REPAIR  (Right) as a surgical intervention .  The patient's history has been reviewed, patient examined, no change in status, stable for surgery.  I have reviewed the patient's chart and labs.  Questions were answered to the patient's satisfaction.     Joyel Chenette JR,W D

## 2014-04-19 NOTE — Op Note (Signed)
04/19/2014  12:16 PM  PATIENT:  [name]    PRE-OPERATIVE DIAGNOSIS:  Right shoulder dislocation with labral tear, and supraspinatus tear  POST-OPERATIVE DIAGNOSIS:  Same, with grade 4 chondral loss inferiorly on the glenoid and extensive grade 2 and grade 3 chondral changes on the humeral head  PROCEDURE:  Right shoulder arthroscopy with extensive debridement of the labrum, cartilage, biceps T no lysis, with anterior Bankart repair, and super spinatus repair  Primary surgeon: Flossie Dibble, M.D.  CO- SURGEON:  Khamya Topp P, MD  present and scrubbed throughout the case, critical for completion in a timely fashion, and for retraction, instrumentation, and closure.  PHYSICIAN ASSISTANT: Joya Gaskins, OPA-C  ANESTHESIA:   General  PREOPERATIVE INDICATIONS:  [name] is a  59 y.o. male who dislocated his right shoulder and had a near circumferential labral tear as well as supraspinatus tear. He also had glenohumeral degenerative changes.  The risks benefits and alternatives were discussed with the patient preoperatively including but not limited to the risks of infection, bleeding, nerve injury, cardiopulmonary complications, the need for revision surgery, among others, and the patient was willing to proceed. We also discussed the risks for stiffness, progression of arthritis, incomplete relief symptoms, recurrent instability, among others.  OPERATIVE IMPLANTS: Arthrex bio composite 2.9 mm push locks 2 with inverted labral tape 2 for the anterior labrum, and a Arthrex 4.75 mm bio composite swivel lock for the supraspinatus with a posterior fiber tape and anterior FiberWire.  OPERATIVE FINDINGS: His shoulder was actually fairly stiff during examination under anesthesia and did not dislocate. He had more of an adhesive capsulitis type of picture. Intra-articularly there was near circumferential tearing of the labrum, with stripping from the anterosuperior aspect down along the  anterior glenoid around inferiorly and extending posteriorly. There was also full-thickness supraspinatus tear. The biceps tendon was more than 50% torn. The supraspinatus had a bilaminar tear, infraspinatus was intact and subscapularis was also intact. The bone quality was quite good, and the tendon quality was also fairly thick.   OPERATIVE PROCEDURE: The patient is brought to the operating room and placed in the supine position. Gen. anesthesia was administered. IV antibiotics were given. Examination of the right upper extremity demonstrated significant stiffness, and manipulation yielded lysis of adhesions. He was in a semilateral decubitus position and all bony prominences padded. Timeout was performed. After sterile prep and drape diagnostic arthroscopy was carried out with the above-named findings. I used 2 anterior portals and reflected the labrum off of the neck of the scapula, used the shaver and the arthroscopic basket to debride the labrum and particularly on the posterior aspect where there was a stump of torn labrum. The curved suture passer was used to pass a horizontal suture through the inferior glenohumeral ligament. This was anchored up onto the face. Excellent re-apposition of the labrum to the face of the scapula was achieved. I reinforced this superiorly with another anchor. Posteriorly the labrum was well attached, although there was degenerative fragmented portions of it that I debrided, however the overall stability was intact and I did not feel posterior stabilization was indicated.  I went to the subacromial space and performed a bursectomy. The CA ligament was pristine, and did not need an acromioplasty. I placed an anterior cannula, and the tear was extremely anterior. I performed a light tuboplasty with the shaver. I placed posterior suture with a BirdBeak as well as a scorpion suture passer, and then an anterior FiberWire. This was anchored laterally into  the tip of the tuberosity  with a 4.75 mm swivel lock. Excellent re-apposition of tendon to bone was achieved. The cuff now moved as a single unit. Instruments were removed, and the portals closed with Monocryl followed by Steri-Strips and sterile gauze. He was awakened and returned to the PACU in stable and satisfactory condition. There were no complications and he tolerated the procedure well.

## 2014-04-19 NOTE — Anesthesia Postprocedure Evaluation (Signed)
  Anesthesia Post-op Note  Patient: [name]  Procedure(s) Performed: Procedure(s): RIGHT SHOULDER ARTHROSCOPY DEBRIDEMENT WITH Sharlet Salina AND ROTATOR CUFF REPAIR  (Right) ARTHROSCOPIC ROTATOR CUFF REPAIR (Right)  Patient Location: PACU  Anesthesia Type: General with regional for post op pain   Level of Consciousness: awake, alert  and oriented  Airway and Oxygen Therapy: Patient Spontanous Breathing  Post-op Pain: mild  Post-op Assessment: Post-op Vital signs reviewed  Post-op Vital Signs: Reviewed  Last Vitals:  Filed Vitals:   04/19/14 1300  BP: 134/83  Pulse: 86  Temp:   Resp: 27    Complications: No apparent anesthesia complications

## 2014-04-22 ENCOUNTER — Encounter (HOSPITAL_BASED_OUTPATIENT_CLINIC_OR_DEPARTMENT_OTHER): Payer: Self-pay | Admitting: Orthopedic Surgery

## 2014-04-30 NOTE — Telephone Encounter (Signed)
APPT 04/16/14 W/ DR CAFFREY

## 2014-05-22 ENCOUNTER — Ambulatory Visit (HOSPITAL_COMMUNITY): Payer: 59 | Attending: Orthopedic Surgery | Admitting: Occupational Therapy

## 2014-05-22 ENCOUNTER — Encounter (HOSPITAL_COMMUNITY): Payer: Self-pay | Admitting: Occupational Therapy

## 2014-05-22 DIAGNOSIS — Z4789 Encounter for other orthopedic aftercare: Secondary | ICD-10-CM | POA: Insufficient documentation

## 2014-05-22 DIAGNOSIS — M6281 Muscle weakness (generalized): Secondary | ICD-10-CM | POA: Diagnosis not present

## 2014-05-22 DIAGNOSIS — I10 Essential (primary) hypertension: Secondary | ICD-10-CM | POA: Insufficient documentation

## 2014-05-22 DIAGNOSIS — M25619 Stiffness of unspecified shoulder, not elsewhere classified: Secondary | ICD-10-CM

## 2014-05-22 DIAGNOSIS — M25611 Stiffness of right shoulder, not elsewhere classified: Secondary | ICD-10-CM | POA: Insufficient documentation

## 2014-05-22 DIAGNOSIS — Z9889 Other specified postprocedural states: Secondary | ICD-10-CM

## 2014-05-22 DIAGNOSIS — R29898 Other symptoms and signs involving the musculoskeletal system: Secondary | ICD-10-CM
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
## 2014-05-22 NOTE — Patient Instructions (Signed)
SHOULDER: Flexion On Table   Place hands on table, elbows straight. Move hips away from body. Press hands down into table. Hold _5__ seconds. _10__ reps per set, _1-2__ sets per day, __7_ days per week  Abduction (Passive)   With arm out to side, resting on table, lower head toward arm, keeping trunk away from table. Hold __5__ seconds. Repeat __10__ times. Do _1-2___ sessions per day.  Copyright  VHI. All rights reserved.     Internal Rotation (Assistive)   Seated with elbow bent at right angle and held against side, slide arm on table surface in an inward arc. Repeat __10__ times. Do _1-2___ sessions per day. Activity: Use this motion to brush crumbs off the table.  Copyright  VHI. All rights reserved.

## 2014-05-24 ENCOUNTER — Ambulatory Visit (HOSPITAL_COMMUNITY): Payer: 59 | Admitting: Occupational Therapy

## 2014-05-24 ENCOUNTER — Encounter (HOSPITAL_COMMUNITY): Payer: Self-pay | Admitting: Occupational Therapy

## 2014-05-24 DIAGNOSIS — R29898 Other symptoms and signs involving the musculoskeletal system: Secondary | ICD-10-CM

## 2014-05-24 DIAGNOSIS — M6289 Other specified disorders of muscle: Secondary | ICD-10-CM

## 2014-05-24 DIAGNOSIS — M25619 Stiffness of unspecified shoulder, not elsewhere classified: Secondary | ICD-10-CM

## 2014-05-24 DIAGNOSIS — Z4789 Encounter for other orthopedic aftercare: Secondary | ICD-10-CM | POA: Diagnosis not present

## 2014-05-24 DIAGNOSIS — M25512 Pain in left shoulder: Secondary | ICD-10-CM
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
## 2014-05-29 ENCOUNTER — Ambulatory Visit (HOSPITAL_COMMUNITY): Payer: 59 | Admitting: Occupational Therapy

## 2014-05-29 DIAGNOSIS — M25619 Stiffness of unspecified shoulder, not elsewhere classified: Secondary | ICD-10-CM

## 2014-05-29 DIAGNOSIS — R29898 Other symptoms and signs involving the musculoskeletal system: Secondary | ICD-10-CM

## 2014-05-29 DIAGNOSIS — M25512 Pain in left shoulder: Secondary | ICD-10-CM

## 2014-05-29 DIAGNOSIS — M6289 Other specified disorders of muscle: Secondary | ICD-10-CM

## 2014-05-29 DIAGNOSIS — Z4789 Encounter for other orthopedic aftercare: Secondary | ICD-10-CM | POA: Diagnosis not present
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
## 2014-05-31 ENCOUNTER — Encounter (HOSPITAL_COMMUNITY): Payer: Self-pay

## 2014-05-31 ENCOUNTER — Ambulatory Visit (HOSPITAL_COMMUNITY): Payer: 59

## 2014-05-31 ENCOUNTER — Ambulatory Visit (HOSPITAL_COMMUNITY): Payer: 59 | Admitting: Physical Therapy

## 2014-05-31 DIAGNOSIS — R29898 Other symptoms and signs involving the musculoskeletal system: Secondary | ICD-10-CM

## 2014-05-31 DIAGNOSIS — M6289 Other specified disorders of muscle: Secondary | ICD-10-CM

## 2014-05-31 DIAGNOSIS — M25619 Stiffness of unspecified shoulder, not elsewhere classified: Secondary | ICD-10-CM

## 2014-05-31 DIAGNOSIS — Z4789 Encounter for other orthopedic aftercare: Secondary | ICD-10-CM | POA: Diagnosis not present

## 2014-05-31 DIAGNOSIS — M25512 Pain in left shoulder: Secondary | ICD-10-CM
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
## 2014-06-03 ENCOUNTER — Encounter (HOSPITAL_COMMUNITY): Payer: Self-pay

## 2014-06-03 ENCOUNTER — Ambulatory Visit (HOSPITAL_COMMUNITY): Payer: 59

## 2014-06-03 DIAGNOSIS — Z4789 Encounter for other orthopedic aftercare: Secondary | ICD-10-CM | POA: Diagnosis not present

## 2014-06-03 DIAGNOSIS — M6289 Other specified disorders of muscle: Secondary | ICD-10-CM

## 2014-06-03 DIAGNOSIS — R29898 Other symptoms and signs involving the musculoskeletal system: Secondary | ICD-10-CM

## 2014-06-03 DIAGNOSIS — M25619 Stiffness of unspecified shoulder, not elsewhere classified: Secondary | ICD-10-CM

## 2014-06-03 DIAGNOSIS — M25512 Pain in left shoulder: Secondary | ICD-10-CM
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
## 2014-06-05 ENCOUNTER — Encounter (HOSPITAL_COMMUNITY): Payer: Self-pay | Admitting: Occupational Therapy

## 2014-06-05 ENCOUNTER — Ambulatory Visit (HOSPITAL_COMMUNITY): Payer: 59 | Admitting: Occupational Therapy

## 2014-06-05 DIAGNOSIS — R29898 Other symptoms and signs involving the musculoskeletal system: Secondary | ICD-10-CM

## 2014-06-05 DIAGNOSIS — M6289 Other specified disorders of muscle: Secondary | ICD-10-CM

## 2014-06-05 DIAGNOSIS — M25512 Pain in left shoulder: Secondary | ICD-10-CM

## 2014-06-05 DIAGNOSIS — M25619 Stiffness of unspecified shoulder, not elsewhere classified: Secondary | ICD-10-CM

## 2014-06-05 DIAGNOSIS — Z4789 Encounter for other orthopedic aftercare: Secondary | ICD-10-CM | POA: Diagnosis not present
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
## 2014-06-07 ENCOUNTER — Encounter (HOSPITAL_COMMUNITY): Payer: Self-pay | Admitting: Occupational Therapy

## 2014-06-07 ENCOUNTER — Ambulatory Visit (HOSPITAL_COMMUNITY): Payer: 59 | Admitting: Occupational Therapy

## 2014-06-07 DIAGNOSIS — M25512 Pain in left shoulder: Secondary | ICD-10-CM

## 2014-06-07 DIAGNOSIS — M25619 Stiffness of unspecified shoulder, not elsewhere classified: Secondary | ICD-10-CM

## 2014-06-07 DIAGNOSIS — Z4789 Encounter for other orthopedic aftercare: Secondary | ICD-10-CM | POA: Diagnosis not present

## 2014-06-07 DIAGNOSIS — R29898 Other symptoms and signs involving the musculoskeletal system: Secondary | ICD-10-CM

## 2014-06-07 DIAGNOSIS — M6289 Other specified disorders of muscle: Secondary | ICD-10-CM
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
## 2014-06-10 ENCOUNTER — Ambulatory Visit (HOSPITAL_COMMUNITY): Payer: 59

## 2014-06-10 ENCOUNTER — Encounter (HOSPITAL_COMMUNITY): Payer: 59 | Admitting: Specialist

## 2014-06-10 ENCOUNTER — Encounter (HOSPITAL_COMMUNITY): Payer: Self-pay

## 2014-06-10 DIAGNOSIS — M25619 Stiffness of unspecified shoulder, not elsewhere classified: Secondary | ICD-10-CM

## 2014-06-10 DIAGNOSIS — M6289 Other specified disorders of muscle: Secondary | ICD-10-CM

## 2014-06-10 DIAGNOSIS — R29898 Other symptoms and signs involving the musculoskeletal system: Secondary | ICD-10-CM

## 2014-06-10 DIAGNOSIS — Z4789 Encounter for other orthopedic aftercare: Secondary | ICD-10-CM | POA: Diagnosis not present
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
## 2014-06-11 ENCOUNTER — Ambulatory Visit (HOSPITAL_COMMUNITY)
Admission: RE | Admit: 2014-06-11 | Discharge: 2014-06-11 | Disposition: A | Discharge status: Home / Self Care | Payer: 59 | Source: Ambulatory Visit | Attending: Family Medicine | Admitting: Family Medicine

## 2014-06-11 ENCOUNTER — Other Ambulatory Visit (HOSPITAL_COMMUNITY): Payer: Self-pay | Admitting: Family Medicine

## 2014-06-11 DIAGNOSIS — R05 Cough: Secondary | ICD-10-CM

## 2014-06-11 DIAGNOSIS — I10 Essential (primary) hypertension: Secondary | ICD-10-CM

## 2014-06-11 DIAGNOSIS — G4730 Sleep apnea, unspecified: Secondary | ICD-10-CM | POA: Diagnosis present

## 2014-06-11 DIAGNOSIS — R062 Wheezing: Secondary | ICD-10-CM | POA: Insufficient documentation

## 2014-06-11 DIAGNOSIS — R059 Cough, unspecified: Secondary | ICD-10-CM

## 2014-06-11 DIAGNOSIS — Z87891 Personal history of nicotine dependence: Secondary | ICD-10-CM

## 2014-06-11 DIAGNOSIS — R0902 Hypoxemia: Secondary | ICD-10-CM | POA: Diagnosis present

## 2014-06-11 DIAGNOSIS — N179 Acute kidney failure, unspecified: Secondary | ICD-10-CM | POA: Diagnosis present

## 2014-06-11 DIAGNOSIS — R599 Enlarged lymph nodes, unspecified: Secondary | ICD-10-CM | POA: Diagnosis present

## 2014-06-11 DIAGNOSIS — R0602 Shortness of breath: Secondary | ICD-10-CM

## 2014-06-11 DIAGNOSIS — C3491 Malignant neoplasm of unspecified part of right bronchus or lung: Secondary | ICD-10-CM | POA: Diagnosis present

## 2014-06-11 DIAGNOSIS — R0789 Other chest pain: Secondary | ICD-10-CM | POA: Insufficient documentation

## 2014-06-11 DIAGNOSIS — J189 Pneumonia, unspecified organism: Secondary | ICD-10-CM | POA: Diagnosis not present

## 2014-06-11 DIAGNOSIS — J9621 Acute and chronic respiratory failure with hypoxia: Secondary | ICD-10-CM | POA: Diagnosis present

## 2014-06-11 DIAGNOSIS — I509 Heart failure, unspecified: Secondary | ICD-10-CM | POA: Diagnosis present

## 2014-06-11 DIAGNOSIS — B370 Candidal stomatitis: Secondary | ICD-10-CM | POA: Diagnosis present

## 2014-06-12 ENCOUNTER — Encounter (HOSPITAL_COMMUNITY): Payer: Self-pay

## 2014-06-12 ENCOUNTER — Ambulatory Visit (HOSPITAL_COMMUNITY): Payer: 59

## 2014-06-12 DIAGNOSIS — M6289 Other specified disorders of muscle: Secondary | ICD-10-CM

## 2014-06-12 DIAGNOSIS — M25619 Stiffness of unspecified shoulder, not elsewhere classified: Secondary | ICD-10-CM

## 2014-06-12 DIAGNOSIS — R29898 Other symptoms and signs involving the musculoskeletal system: Secondary | ICD-10-CM

## 2014-06-12 DIAGNOSIS — Z4789 Encounter for other orthopedic aftercare: Secondary | ICD-10-CM | POA: Diagnosis not present
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
## 2014-06-13 ENCOUNTER — Encounter (HOSPITAL_COMMUNITY): Payer: Self-pay | Admitting: *Deleted

## 2014-06-13 ENCOUNTER — Emergency Department (HOSPITAL_COMMUNITY): Payer: 59

## 2014-06-13 ENCOUNTER — Inpatient Hospital Stay (HOSPITAL_COMMUNITY): Payer: 59

## 2014-06-13 ENCOUNTER — Other Ambulatory Visit (HOSPITAL_COMMUNITY): Payer: Self-pay

## 2014-06-13 ENCOUNTER — Inpatient Hospital Stay (HOSPITAL_COMMUNITY)
Admission: EM | Admit: 2014-06-13 | Discharge: 2014-06-23 | DRG: 193 | Disposition: A | Discharge status: Home / Self Care | Payer: 59 | Attending: Family Medicine | Admitting: Family Medicine

## 2014-06-13 ENCOUNTER — Other Ambulatory Visit: Payer: Self-pay

## 2014-06-13 DIAGNOSIS — R062 Wheezing: Secondary | ICD-10-CM | POA: Diagnosis not present

## 2014-06-13 DIAGNOSIS — N179 Acute kidney failure, unspecified: Secondary | ICD-10-CM | POA: Diagnosis present

## 2014-06-13 DIAGNOSIS — R0902 Hypoxemia: Secondary | ICD-10-CM | POA: Diagnosis present

## 2014-06-13 DIAGNOSIS — C3491 Malignant neoplasm of unspecified part of right bronchus or lung: Secondary | ICD-10-CM | POA: Diagnosis present

## 2014-06-13 DIAGNOSIS — R599 Enlarged lymph nodes, unspecified: Secondary | ICD-10-CM | POA: Diagnosis present

## 2014-06-13 DIAGNOSIS — R0602 Shortness of breath: Secondary | ICD-10-CM

## 2014-06-13 DIAGNOSIS — I10 Essential (primary) hypertension: Secondary | ICD-10-CM | POA: Diagnosis present

## 2014-06-13 DIAGNOSIS — Z87891 Personal history of nicotine dependence: Secondary | ICD-10-CM | POA: Diagnosis not present

## 2014-06-13 DIAGNOSIS — I340 Nonrheumatic mitral (valve) insufficiency: Secondary | ICD-10-CM | POA: Diagnosis not present

## 2014-06-13 DIAGNOSIS — G4730 Sleep apnea, unspecified: Secondary | ICD-10-CM | POA: Diagnosis present

## 2014-06-13 DIAGNOSIS — J9621 Acute and chronic respiratory failure with hypoxia: Secondary | ICD-10-CM | POA: Diagnosis present

## 2014-06-13 DIAGNOSIS — J9601 Acute respiratory failure with hypoxia: Secondary | ICD-10-CM | POA: Diagnosis not present

## 2014-06-13 DIAGNOSIS — J189 Pneumonia, unspecified organism: Secondary | ICD-10-CM | POA: Diagnosis present

## 2014-06-13 DIAGNOSIS — B370 Candidal stomatitis: Secondary | ICD-10-CM | POA: Diagnosis present

## 2014-06-13 DIAGNOSIS — I509 Heart failure, unspecified: Secondary | ICD-10-CM | POA: Diagnosis present

## 2014-06-13 LAB — BLOOD GAS, ARTERIAL
ACID-BASE EXCESS: 1.2 mmol/L (ref 0.0–2.0)
Bicarbonate: 23.9 mEq/L (ref 20.0–24.0)
Drawn by: 22223
O2 Content: 8 L/min
O2 Saturation: 94.8 %
PATIENT TEMPERATURE: 37
PCO2 ART: 29 mmHg — AB (ref 35.0–45.0)
PH ART: 7.526 — AB (ref 7.350–7.450)
TCO2: 20.73 mmol/L (ref 0–100)
pO2, Arterial: 72.6 mmHg — ABNORMAL LOW (ref 80.0–100.0)

## 2014-06-13 LAB — CBC WITH DIFFERENTIAL/PLATELET
BASOS ABS: 0 10*3/uL (ref 0.0–0.1)
Basophils Relative: 0 % (ref 0–1)
EOS ABS: 0 10*3/uL (ref 0.0–0.7)
EOS PCT: 0 % (ref 0–5)
HCT: 42.4 % (ref 39.0–52.0)
Hemoglobin: 13.3 g/dL (ref 13.0–17.0)
LYMPHS PCT: 19 % (ref 12–46)
Lymphs Abs: 1.7 10*3/uL (ref 0.7–4.0)
MCH: 29.4 pg (ref 26.0–34.0)
MCHC: 31.4 g/dL (ref 30.0–36.0)
MCV: 93.8 fL (ref 78.0–100.0)
MONO ABS: 0.5 10*3/uL (ref 0.1–1.0)
Monocytes Relative: 6 % (ref 3–12)
Neutro Abs: 6.5 10*3/uL (ref 1.7–7.7)
Neutrophils Relative %: 75 % (ref 43–77)
Platelets: 196 10*3/uL (ref 150–400)
RBC: 4.52 MIL/uL (ref 4.22–5.81)
RDW: 14.5 % (ref 11.5–15.5)
WBC: 8.7 10*3/uL (ref 4.0–10.5)

## 2014-06-13 LAB — RAPID HIV SCREEN (HIV 1/2 AB+AG)
HIV 1/2 Antibodies: NONREACTIVE
HIV-1 P24 Antigen - HIV24: NONREACTIVE

## 2014-06-13 LAB — BASIC METABOLIC PANEL
ANION GAP: 7 (ref 5–15)
BUN: 12 mg/dL (ref 6–23)
CO2: 27 mmol/L (ref 19–32)
Calcium: 8.8 mg/dL (ref 8.4–10.5)
Chloride: 105 mmol/L (ref 96–112)
Creatinine, Ser: 1.26 mg/dL (ref 0.50–1.35)
GFR calc Af Amer: 70 mL/min — ABNORMAL LOW (ref >90)
GFR calc non Af Amer: 61 mL/min — ABNORMAL LOW (ref >90)
Glucose, Bld: 101 mg/dL — ABNORMAL HIGH (ref 70–99)
POTASSIUM: 3.5 mmol/L (ref 3.5–5.1)
Sodium: 139 mmol/L (ref 135–145)

## 2014-06-13 LAB — LACTATE DEHYDROGENASE: LDH: 210 U/L (ref 94–250)

## 2014-06-13 LAB — LACTIC ACID, PLASMA: Lactic Acid, Venous: 0.9 mmol/L (ref 0.5–2.0)

## 2014-06-13 MED ORDER — DEXTROSE 5 % IV SOLN
1.0000 g | Freq: Once | INTRAVENOUS | Status: AC
  Administered 2014-06-14: 1 g via INTRAVENOUS
  Filled 2014-06-13: qty 10

## 2014-06-13 MED ORDER — IOHEXOL 350 MG/ML SOLN
100.0000 mL | Freq: Once | INTRAVENOUS | Status: AC | PRN
  Administered 2014-06-13: 100 mL via INTRAVENOUS

## 2014-06-13 MED ORDER — ALBUTEROL SULFATE (2.5 MG/3ML) 0.083% IN NEBU
INHALATION_SOLUTION | RESPIRATORY_TRACT | Status: AC
  Filled 2014-06-13: qty 3

## 2014-06-13 MED ORDER — ALBUTEROL SULFATE (2.5 MG/3ML) 0.083% IN NEBU
2.5000 mg | INHALATION_SOLUTION | Freq: Once | RESPIRATORY_TRACT | Status: AC
  Administered 2014-06-13: 2.5 mg via RESPIRATORY_TRACT

## 2014-06-13 MED ORDER — IPRATROPIUM-ALBUTEROL 0.5-2.5 (3) MG/3ML IN SOLN
3.0000 mL | Freq: Once | RESPIRATORY_TRACT | Status: AC
  Administered 2014-06-13: 3 mL via RESPIRATORY_TRACT
  Filled 2014-06-13: qty 3

## 2014-06-13 MED ORDER — ALBUTEROL SULFATE (2.5 MG/3ML) 0.083% IN NEBU
5.0000 mg | INHALATION_SOLUTION | Freq: Once | RESPIRATORY_TRACT | Status: AC
  Administered 2014-06-13: 5 mg via RESPIRATORY_TRACT
  Filled 2014-06-13: qty 6

## 2014-06-13 MED ORDER — DEXTROSE 5 % IV SOLN
500.0000 mg | INTRAVENOUS | Status: DC
  Administered 2014-06-14 – 2014-06-15 (×3): 500 mg via INTRAVENOUS
  Filled 2014-06-13 (×4): qty 500
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
## 2014-06-13 NOTE — ED Notes (Signed)
Pt complaining of SOB. Pt was seen by PCP 2 days ago & started on ABX. Pt says not getting any better.
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
## 2014-06-13 NOTE — ED Notes (Signed)
Patient walked around nursing station; O2 sats decreased to 55%.

## 2014-06-13 NOTE — ED Notes (Signed)
Attempted IV x2 without success  

## 2014-06-14 ENCOUNTER — Encounter (HOSPITAL_COMMUNITY): Payer: Self-pay | Admitting: *Deleted

## 2014-06-14 ENCOUNTER — Ambulatory Visit (HOSPITAL_COMMUNITY): Payer: 59

## 2014-06-14 DIAGNOSIS — I340 Nonrheumatic mitral (valve) insufficiency: Secondary | ICD-10-CM

## 2014-06-14 LAB — TSH: TSH: 1.89 u[IU]/mL (ref 0.350–4.500)

## 2014-06-14 LAB — BRAIN NATRIURETIC PEPTIDE: B Natriuretic Peptide: 82 pg/mL (ref 0.0–100.0)

## 2014-06-14 LAB — MRSA PCR SCREENING: MRSA by PCR: NEGATIVE

## 2014-06-14 MED ORDER — CLONIDINE HCL 0.1 MG PO TABS
0.1000 mg | ORAL_TABLET | Freq: Two times a day (BID) | ORAL | Status: DC
  Administered 2014-06-14 – 2014-06-23 (×20): 0.1 mg via ORAL
  Filled 2014-06-14 (×20): qty 1

## 2014-06-14 MED ORDER — VALSARTAN-HYDROCHLOROTHIAZIDE 320-25 MG PO TABS: 1.0000 | ORAL_TABLET | Freq: Every day | ORAL | Status: DC

## 2014-06-14 MED ORDER — ONDANSETRON HCL 4 MG/2ML IJ SOLN: 4.0000 mg | Freq: Four times a day (QID) | INTRAMUSCULAR | Status: DC | PRN

## 2014-06-14 MED ORDER — ONDANSETRON HCL 4 MG PO TABS: 4.0000 mg | ORAL_TABLET | Freq: Four times a day (QID) | ORAL | Status: DC | PRN

## 2014-06-14 MED ORDER — DEXTROSE 5 % IV SOLN
1.0000 g | INTRAVENOUS | Status: DC
  Administered 2014-06-15 – 2014-06-16 (×2): 1 g via INTRAVENOUS
  Filled 2014-06-14 (×3): qty 10

## 2014-06-14 MED ORDER — IRBESARTAN 300 MG PO TABS
300.0000 mg | ORAL_TABLET | Freq: Every day | ORAL | Status: DC
  Administered 2014-06-14 – 2014-06-23 (×10): 300 mg via ORAL
  Filled 2014-06-14 (×10): qty 1

## 2014-06-14 MED ORDER — POTASSIUM CHLORIDE CRYS ER 20 MEQ PO TBCR
20.0000 meq | EXTENDED_RELEASE_TABLET | Freq: Every day | ORAL | Status: DC
  Administered 2014-06-14 – 2014-06-23 (×10): 20 meq via ORAL
  Filled 2014-06-14 (×10): qty 1

## 2014-06-14 MED ORDER — HYDROCHLOROTHIAZIDE 25 MG PO TABS
25.0000 mg | ORAL_TABLET | Freq: Every day | ORAL | Status: DC
  Administered 2014-06-14 – 2014-06-19 (×6): 25 mg via ORAL
  Filled 2014-06-14 (×6): qty 1

## 2014-06-14 MED ORDER — ALBUTEROL SULFATE (2.5 MG/3ML) 0.083% IN NEBU
2.5000 mg | INHALATION_SOLUTION | Freq: Four times a day (QID) | RESPIRATORY_TRACT | Status: DC
  Administered 2014-06-14 – 2014-06-15 (×5): 2.5 mg via RESPIRATORY_TRACT
  Filled 2014-06-14 (×5): qty 3

## 2014-06-14 MED ORDER — CEFTRIAXONE SODIUM IN DEXTROSE 20 MG/ML IV SOLN
1.0000 g | INTRAVENOUS | Status: DC
  Filled 2014-06-14 (×3): qty 50

## 2014-06-14 MED ORDER — LABETALOL HCL 5 MG/ML IV SOLN
10.0000 mg | INTRAVENOUS | Status: DC | PRN
  Administered 2014-06-14: 10 mg via INTRAVENOUS
  Filled 2014-06-14 (×2): qty 4

## 2014-06-14 MED ORDER — FUROSEMIDE 10 MG/ML IJ SOLN
20.0000 mg | Freq: Two times a day (BID) | INTRAMUSCULAR | Status: DC
Start: 2014-06-14 — End: 2014-06-19
  Administered 2014-06-14 – 2014-06-19 (×11): 20 mg via INTRAVENOUS
  Filled 2014-06-14 (×11): qty 2

## 2014-06-14 MED ORDER — SENNOSIDES-DOCUSATE SODIUM 8.6-50 MG PO TABS
2.0000 | ORAL_TABLET | Freq: Every day | ORAL | Status: DC
  Administered 2014-06-14 – 2014-06-23 (×10): 2 via ORAL
  Filled 2014-06-14 (×11): qty 2

## 2014-06-14 MED ORDER — ENOXAPARIN SODIUM 40 MG/0.4ML ~~LOC~~ SOLN
40.0000 mg | SUBCUTANEOUS | Status: DC
  Administered 2014-06-14 – 2014-06-19 (×6): 40 mg via SUBCUTANEOUS
  Filled 2014-06-14 (×6): qty 0.4

## 2014-06-14 MED ORDER — ALBUTEROL SULFATE (2.5 MG/3ML) 0.083% IN NEBU
2.5000 mg | INHALATION_SOLUTION | RESPIRATORY_TRACT | Status: DC | PRN
  Administered 2014-06-19 – 2014-06-23 (×2): 2.5 mg via RESPIRATORY_TRACT
  Filled 2014-06-14 (×2): qty 3

## 2014-06-14 NOTE — Progress Notes (Signed)
Humidification added to patients oxygen

## 2014-06-14 NOTE — Progress Notes (Signed)
Subjective: This patient is feeling more comfortable now. He is on 4 L of O2 and O2 sats are more normal. He was admitted with dyspnea. Has been started on IV antibiotics. He has had no edema. Objective: Vital signs in last 24 hours: Temp:  [97.7 F (36.5 C)-99.7 F (37.6 C)] 97.7 F (36.5 C) (04/29 1237) Pulse Rate:  [92-120] 101 (04/29 1200) Resp:  [12-29] 24 (04/29 1200) BP: (137-173)/(80-116) 146/84 mmHg (04/29 1200) SpO2:  [70 %-97 %] 93 % (04/29 1448) FiO2 (%):  [0 %] 0 % (04/29 0043) Weight:  [111.131 kg (245 lb)-111.5 kg (245 lb 13 oz)] 111.5 kg (245 lb 13 oz) (04/29 0500) Weight change:  Last BM Date: 06/13/14  Intake/Output from previous day: 04/28 0701 - 04/29 0700 In: -  Out: 250 [Urine:250] Intake/Output this shift: Total I/O In: 600 [P.O.:600] Out: -   Physical Exam: The patient is alert and oriented on nasal O2  HEENT negative  Neck supple no JVD or thyroid abnormalities  Heart regular rhythm no murmurs  Lungs occasional rhonchus heard over lower lung field  Abdomen no palpable organs or masses  Extremities no edema   Recent Labs  06/13/14 2124  WBC 8.7  HGB 13.3  HCT 42.4  PLT 196   BMET  Recent Labs  06/13/14 2124  NA 139  K 3.5  CL 105  CO2 27  GLUCOSE 101*  BUN 12  CREATININE 1.26  CALCIUM 8.8    Studies/Results: Dg Chest 2 View  06/13/2014   CLINICAL DATA:  Central chest pain with weakness, chills and shortness of breath for 3 days. Right rotator cuff repair 2 months ago. Initial encounter.  EXAM: CHEST  2 VIEW  COMPARISON:  Radiographs 06/11/2014 and 03/11/2014.  FINDINGS: The heart size and mediastinal contours are stable. The heart size is at the upper limits of normal. There are persistent diffuse patchy airspace and interstitial opacities bilaterally, unchanged from the examination of 2 days ago. There is some fissural thickening without significant pleural fluid. The bones appear unchanged.  IMPRESSION: No significant change  in bilateral airspace and interstitial opacities compared with recent prior studies. These findings could reflect atypical infection or pulmonary edema.   Electronically Signed   By: Richardean Sale M.D.   On: 06/13/2014 21:48   Ct Angio Chest Pe W/cm &/or Wo Cm  06/14/2014   CLINICAL DATA:  59 year old male with progressive shortness of breath over the last few days  EXAM: CT ANGIOGRAPHY CHEST WITH CONTRAST  TECHNIQUE: Multidetector CT imaging of the chest was performed using the standard protocol during bolus administration of intravenous contrast. Multiplanar CT image reconstructions and MIPs were obtained to evaluate the vascular anatomy.  CONTRAST:  138m OMNIPAQUE IOHEXOL 350 MG/ML SOLN  COMPARISON:  Chest x-ray 06/13/2014 ; prior CT abdomen/ pelvis including lung bases 07/19/2013  FINDINGS: Mediastinum: Prominent right hilar lymphoid tissue. Index measurement of 23 x 17 mm in the right suprahilar station (image 34 series 4). In the right hilar station, the soft tissue density measures approximately 31 x 33 mm (image 45 series 4) numerous small nodules throughout the prevascular, AP window and paratracheal stations. Minimally prominent left infrahilar tissue measures 26 x 15 mm (image 50 series 4). Unremarkable thoracic esophagus.  Heart/Vascular: Adequate opacification of the pulmonary arteries to the proximal segmental level. No evidence of acute pulmonary embolus. Cardiomegaly. Trace pericardial effusion. No aortic aneurysm.  Lungs/Pleura: Interlobular septal thickening and scattered ground-glass attenuation opacities consistent with mild pulmonary edema. Additionally, there  are multifocal patchy airspace opacities throughout the right lung some of which have a nodular configuration concerning for a superimposed infectious/ inflammatory process.  Bones/Soft Tissues: No acute fracture or aggressive appearing lytic or blastic osseous lesion.  Upper Abdomen: Visualized upper abdominal organs are  unremarkable.  Review of the MIP images confirms the above findings.  IMPRESSION: 1. Negative for acute pulmonary embolus. 2. Multifocal patchy airspace opacities many of which demonstrate a nodular configuration scattered in a peribronchovascular distribution throughout the right lung. Findings likely represent an acute infectious/inflammatory process such as multi lobar bronchopneumonia. However, there is prominence of the right hilar nodal soft tissues which is greater than expected for reactive adenopathy. An underlying primary bronchogenic malignancy is suspected. Consider further evaluation with bronchoscopy. Additionally, recommend short interval follow-up evaluation following an appropriate course of therapy. If the findings persist, PET-CT may become warranted. 3. Above changes are superimposed on a background of interlobular septal thickening and patchy ground-glass consistent with pulmonary edema and mild CHF. 4. Cardiomegaly.   Electronically Signed   By: Jacqulynn Cadet M.D.   On: 06/14/2014 00:33    Medications:  . albuterol  2.5 mg Nebulization Q6H  . azithromycin  500 mg Intravenous Q24H  . [START ON 06/15/2014] cefTRIAXone (ROCEPHIN)  IV  1 g Intravenous Q24H  . cloNIDine  0.1 mg Oral BID  . enoxaparin (LOVENOX) injection  40 mg Subcutaneous Q24H  . furosemide  20 mg Intravenous Q12H  . irbesartan  300 mg Oral Daily   And  . hydrochlorothiazide  25 mg Oral Daily  . potassium chloride  20 mEq Oral Daily  . senna-docusate  2 tablet Oral Daily        Assessment/Plan: 1. Community-acquired pneumonia-plan to continue nasal O2 at 4 L/m-continue nebulizer treatments and IV antibiotics Rocephin and Zithromax.  2. Mild CHF with pulmonary edema-plan further workup with 2-D echo and to start IV furosemide  3. Right hilar prominence rule out bronchogenic malignancy-plan CT PET scan   LOS: 1 day   Takasha Vetere G 06/14/2014, 3:37 PM

## 2014-06-14 NOTE — Progress Notes (Signed)
  Echocardiogram 2D Echocardiogram has been performed.  [name] 06/14/2014, 4:19 PM

## 2014-06-15 MED ORDER — ACETAMINOPHEN 325 MG PO TABS
650.0000 mg | ORAL_TABLET | Freq: Four times a day (QID) | ORAL | Status: DC | PRN
  Administered 2014-06-15 – 2014-06-20 (×5): 650 mg via ORAL
  Filled 2014-06-15 (×5): qty 2

## 2014-06-15 MED ORDER — ALBUTEROL SULFATE (2.5 MG/3ML) 0.083% IN NEBU
2.5000 mg | INHALATION_SOLUTION | Freq: Three times a day (TID) | RESPIRATORY_TRACT | Status: DC
  Administered 2014-06-15 – 2014-06-23 (×24): 2.5 mg via RESPIRATORY_TRACT
  Filled 2014-06-15 (×24): qty 3

## 2014-06-15 NOTE — Progress Notes (Signed)
NT notified nurse of temp of 102.5 orally. Nurse rechecked temp = 100.9 orally. Called Dr Anastasio Champion, who ordered Tylenol 650 mg Q6H prn for fever. Will administer as soon as available. Pt denies having chills. Pt voices no other complaints at this time. Will administer PRN Lebatolol for HR 119. Continuing to monitor pt. Bed remains in lowest position and call bell is within reach.

## 2014-06-15 NOTE — Progress Notes (Signed)
Called report to Tedd Sias, RN on dept 300.  Verbalized understanding.  Pt transferred to room 336 in safe and stable condition. Schonewitz, Eulis Canner 06/15/2014

## 2014-06-15 NOTE — Progress Notes (Signed)
Subjective: The patient had a more comfortable night. His O2 sats remained normal. His 2-D echocardiogram was normal. He does cough intermittently and has wheezing is being treated for community-acquired pneumonia  Objective: Vital signs in last 24 hours: Temp:  [97.7 F (36.5 C)-100.4 F (38 C)] 99.5 F (37.5 C) (04/30 0200) Pulse Rate:  [93-106] 99 (04/30 0600) Resp:  [15-35] 30 (04/30 0600) BP: (131-169)/(73-105) 131/73 mmHg (04/30 0600) SpO2:  [88 %-96 %] 92 % (04/30 0719) Weight:  [109 kg (240 lb 4.8 oz)] 109 kg (240 lb 4.8 oz) (04/30 0511) Weight change: -2.131 kg (-4 lb 11.2 oz) Last BM Date: 06/14/14  Intake/Output from previous day: 04/29 0701 - 04/30 0700 In: 25 [P.O.:960; IV Piggyback:550] Out: 2500 [Urine:2500] Intake/Output this shift:    Physical Exam: The patient is alert and oriented on nasal O2  HEENT negative  Neck supple no JVD or thyroid abnormalities  Heart regular rhythm no murmurs  Lungs occasional rhonchus and wheeze heard posteriorly  Abdomen no palpable organs or masses  Extremities free of edema   Recent Labs  06/13/14 2124  WBC 8.7  HGB 13.3  HCT 42.4  PLT 196   BMET  Recent Labs  06/13/14 2124  NA 139  K 3.5  CL 105  CO2 27  GLUCOSE 101*  BUN 12  CREATININE 1.26  CALCIUM 8.8    Studies/Results: Dg Chest 2 View  06/13/2014   CLINICAL DATA:  Central chest pain with weakness, chills and shortness of breath for 3 days. Right rotator cuff repair 2 months ago. Initial encounter.  EXAM: CHEST  2 VIEW  COMPARISON:  Radiographs 06/11/2014 and 03/11/2014.  FINDINGS: The heart size and mediastinal contours are stable. The heart size is at the upper limits of normal. There are persistent diffuse patchy airspace and interstitial opacities bilaterally, unchanged from the examination of 2 days ago. There is some fissural thickening without significant pleural fluid. The bones appear unchanged.  IMPRESSION: No significant change in  bilateral airspace and interstitial opacities compared with recent prior studies. These findings could reflect atypical infection or pulmonary edema.   Electronically Signed   By: Richardean Sale M.D.   On: 06/13/2014 21:48   Ct Angio Chest Pe W/cm &/or Wo Cm  06/14/2014   CLINICAL DATA:  59 year old male with progressive shortness of breath over the last few days  EXAM: CT ANGIOGRAPHY CHEST WITH CONTRAST  TECHNIQUE: Multidetector CT imaging of the chest was performed using the standard protocol during bolus administration of intravenous contrast. Multiplanar CT image reconstructions and MIPs were obtained to evaluate the vascular anatomy.  CONTRAST:  146m OMNIPAQUE IOHEXOL 350 MG/ML SOLN  COMPARISON:  Chest x-ray 06/13/2014 ; prior CT abdomen/ pelvis including lung bases 07/19/2013  FINDINGS: Mediastinum: Prominent right hilar lymphoid tissue. Index measurement of 23 x 17 mm in the right suprahilar station (image 34 series 4). In the right hilar station, the soft tissue density measures approximately 31 x 33 mm (image 45 series 4) numerous small nodules throughout the prevascular, AP window and paratracheal stations. Minimally prominent left infrahilar tissue measures 26 x 15 mm (image 50 series 4). Unremarkable thoracic esophagus.  Heart/Vascular: Adequate opacification of the pulmonary arteries to the proximal segmental level. No evidence of acute pulmonary embolus. Cardiomegaly. Trace pericardial effusion. No aortic aneurysm.  Lungs/Pleura: Interlobular septal thickening and scattered ground-glass attenuation opacities consistent with mild pulmonary edema. Additionally, there are multifocal patchy airspace opacities throughout the right lung some of which have a nodular configuration  concerning for a superimposed infectious/ inflammatory process.  Bones/Soft Tissues: No acute fracture or aggressive appearing lytic or blastic osseous lesion.  Upper Abdomen: Visualized upper abdominal organs are unremarkable.   Review of the MIP images confirms the above findings.  IMPRESSION: 1. Negative for acute pulmonary embolus. 2. Multifocal patchy airspace opacities many of which demonstrate a nodular configuration scattered in a peribronchovascular distribution throughout the right lung. Findings likely represent an acute infectious/inflammatory process such as multi lobar bronchopneumonia. However, there is prominence of the right hilar nodal soft tissues which is greater than expected for reactive adenopathy. An underlying primary bronchogenic malignancy is suspected. Consider further evaluation with bronchoscopy. Additionally, recommend short interval follow-up evaluation following an appropriate course of therapy. If the findings persist, PET-CT may become warranted. 3. Above changes are superimposed on a background of interlobular septal thickening and patchy ground-glass consistent with pulmonary edema and mild CHF. 4. Cardiomegaly.   Electronically Signed   By: Jacqulynn Cadet M.D.   On: 06/14/2014 00:33    Medications:  . albuterol  2.5 mg Nebulization Q6H  . azithromycin  500 mg Intravenous Q24H  . cefTRIAXone (ROCEPHIN)  IV  1 g Intravenous Q24H  . cloNIDine  0.1 mg Oral BID  . enoxaparin (LOVENOX) injection  40 mg Subcutaneous Q24H  . furosemide  20 mg Intravenous Q12H  . irbesartan  300 mg Oral Daily   And  . hydrochlorothiazide  25 mg Oral Daily  . potassium chloride  20 mEq Oral Daily  . senna-docusate  2 tablet Oral Daily        Assessment/Plan: 1. Community-acquired pneumonia-plan to continue nasal O2 at 4 L-nebulizer treatment-IV antibiotics Rocephin and Zithromax  To mild edema haven't patient had normal 2-D echocardiogram will continue IV furosemide  3. Right hilar prominence rule out bronchogenic malignancy with CT PET scan at later date  Patient can move to MedSurg bed today   LOS: 2 days   Rahel Carlton G 06/15/2014, 7:46 AM

## 2014-06-15 NOTE — Progress Notes (Signed)
Utilization review Completed Kimbree Casanas RN BSN   

## 2014-06-16 MED ORDER — VANCOMYCIN HCL 10 G IV SOLR
2000.0000 mg | Freq: Once | INTRAVENOUS | Status: AC
  Administered 2014-06-16: 2000 mg via INTRAVENOUS
  Filled 2014-06-16: qty 2000

## 2014-06-16 MED ORDER — LEVOFLOXACIN IN D5W 750 MG/150ML IV SOLN
750.0000 mg | INTRAVENOUS | Status: DC
  Administered 2014-06-16 – 2014-06-19 (×4): 750 mg via INTRAVENOUS
  Filled 2014-06-16 (×4): qty 150

## 2014-06-16 MED ORDER — VANCOMYCIN HCL IN DEXTROSE 750-5 MG/150ML-% IV SOLN
750.0000 mg | Freq: Two times a day (BID) | INTRAVENOUS | Status: DC
  Administered 2014-06-16 – 2014-06-19 (×6): 750 mg via INTRAVENOUS
  Filled 2014-06-16 (×8): qty 150

## 2014-06-16 NOTE — Progress Notes (Signed)
[name] INO:[id number] DOB: [birth year] DOA: 06/13/2014 PCP: Lanette Hampshire, MD   Subjective: This man was admitted with a community-acquired pneumonia. He was initially in intensive care unit because of quite significant desaturation. He is now on the medical floor but is still requiring 4.5 L oxygen per minute. He is coughing up bloodstained sputum. He has a history of extensive smoking, having quit 3 years ago. Prior to that he was smoking at least 2 packs of cigarettes per day for 20 years. His chest x-ray and CT scan show extensive bilateral bronchogenic pneumonia.           Physical Exam: Blood pressure 137/91, pulse 93, temperature 98.5 F (36.9 C), temperature source Oral, resp. rate 20, height '6\' 1"'$  (1.854 m), weight 109 kg (240 lb 4.8 oz), SpO2 96 %. He does not look well. He gets short of breath walking sentences. His lung fields show bilateral crackles in the mid and lower zones. There is no bronchial breathing. There is no wheezing. Thankfully, he is not toxic or septic clinically. He did have a fever again last night.   Investigations:  Recent Results (from the past 240 hour(s))  Blood culture (routine x 2)     Status: None (Preliminary result)   Collection Time: 06/13/14 10:42 PM  Result Value Ref Range Status   Specimen Description BLOOD LEFT ANTECUBITAL  Final   Special Requests BOTTLES DRAWN AEROBIC AND ANAEROBIC 6CC EACH  Final   Culture NO GROWTH 2 DAYS  Final   Report Status PENDING  Incomplete  Blood culture (routine x 2)     Status: None (Preliminary result)   Collection Time: 06/13/14 10:52 PM  Result Value Ref Range Status   Specimen Description BLOOD RIGHT ANTECUBITAL  Final   Special Requests   Final    BOTTLES DRAWN AEROBIC AND ANAEROBIC AEB 5CC ANA [place]   Culture NO GROWTH 2 DAYS  Final   Report Status PENDING  Incomplete  MRSA PCR Screening     Status: None   Collection Time: 06/14/14  1:00 AM  Result Value Ref Range Status   MRSA by  PCR NEGATIVE NEGATIVE Final    Comment:        The GeneXpert MRSA Assay (FDA approved for NASAL specimens only), is one component of a comprehensive MRSA colonization surveillance program. It is not intended to diagnose MRSA infection nor to guide or monitor treatment for MRSA infections.      Basic Metabolic Panel:  Recent Labs  06/13/14 2124  NA 139  K 3.5  CL 105  CO2 27  GLUCOSE 101*  BUN 12  CREATININE 1.26  CALCIUM 8.8   Liver Function Tests: No results for input(s): AST, ALT, ALKPHOS, BILITOT, PROT, ALBUMIN in the last 72 hours.   CBC:  Recent Labs  06/13/14 2124  WBC 8.7  NEUTROABS 6.5  HGB 13.3  HCT 42.4  MCV 93.8  PLT 196    No results found.    Medications: I have reviewed the patient's current medications.  Impression: 1. Community-acquired pneumonia, bronchopneumonia. This appears to be quite severe. I think that he warrants more broad-spectrum antibiotics and I will change him to vancomycin and Levaquin. 2. 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 We will ask pulmonology to see the patient tomorrow. I think he really needs bronchoscopy soon, once he is stable. Since he is a relatively young man, I think he should be treated fairly aggressively for his presumed lung cancer. I have not told the patient himself  by suspicions because we do not have any clear definitive diagnosis.      Consultants:  Pulmonary consult.   Procedures:  None.   Antibiotics:  Intravenous vancomycin and intravenous Levaquin.                   Code Status: Full code.  Family Communication: I discussed the plan with the patient at the bedside.   Disposition Plan: Depending on progress.  Time spent: 20 minutes.   LOS: 3 days   [place] C   06/16/2014, 8:41 AM

## 2014-06-16 NOTE — Progress Notes (Signed)
ANTIBIOTIC CONSULT NOTE  Pharmacy Consult for Vancomycin Indication: pneumonia  No Known Allergies  Patient Measurements: Height: '6\' 1"'$  (185.4 cm) Weight: 240 lb 4.8 oz (109 kg) IBW/kg (Calculated) : 79.9  Vital Signs: Temp: 98.5 F (36.9 C) (05/01 0439) Temp Source: Oral (05/01 0439) BP: 137/91 mmHg (05/01 0439) Pulse Rate: 93 (05/01 0439) Intake/Output from previous day: 04/30 0701 - 05/01 0700 In: 420 [P.O.:120; IV Piggyback:300] Out: 600 [Urine:600] Intake/Output from this shift:    Labs:  Recent Labs  06/13/14 2124  WBC 8.7  HGB 13.3  PLT 196  CREATININE 1.26   Estimated Creatinine Clearance: 81.7 mL/min (by C-G formula based on Cr of 1.26). No results for input(s): VANCOTROUGH, VANCOPEAK, VANCORANDOM, GENTTROUGH, GENTPEAK, GENTRANDOM, TOBRATROUGH, TOBRAPEAK, TOBRARND, AMIKACINPEAK, AMIKACINTROU, AMIKACIN in the last 72 hours.   Microbiology: Recent Results (from the past 720 hour(s))  Blood culture (routine x 2)     Status: None (Preliminary result)   Collection Time: 06/13/14 10:42 PM  Result Value Ref Range Status   Specimen Description BLOOD LEFT ANTECUBITAL  Final   Special Requests BOTTLES DRAWN AEROBIC AND ANAEROBIC 6CC EACH  Final   Culture NO GROWTH 2 DAYS  Final   Report Status PENDING  Incomplete  Blood culture (routine x 2)     Status: None (Preliminary result)   Collection Time: 06/13/14 10:52 PM  Result Value Ref Range Status   Specimen Description BLOOD RIGHT ANTECUBITAL  Final   Special Requests   Final    BOTTLES DRAWN AEROBIC AND ANAEROBIC AEB 5CC ANA [place]   Culture NO GROWTH 2 DAYS  Final   Report Status PENDING  Incomplete  MRSA PCR Screening     Status: None   Collection Time: 06/14/14  1:00 AM  Result Value Ref Range Status   MRSA by PCR NEGATIVE NEGATIVE Final    Comment:        The GeneXpert MRSA Assay (FDA approved for NASAL specimens only), is one component of a comprehensive MRSA colonization surveillance program. It is  not intended to diagnose MRSA infection nor to guide or monitor treatment for MRSA infections.     Anti-infectives    Start     Dose/Rate Route Frequency Ordered Stop   06/16/14 0945  vancomycin (VANCOCIN) 2,000 mg in sodium chloride 0.9 % 500 mL IVPB     2,000 mg 250 mL/hr over 120 Minutes Intravenous  Once 06/16/14 0934     06/16/14 0845  levofloxacin (LEVAQUIN) IVPB 750 mg     750 mg 100 mL/hr over 90 Minutes Intravenous Every 24 hours 06/16/14 0840     06/15/14 0100  cefTRIAXone (ROCEPHIN) 1 g in dextrose 5 % 50 mL IVPB - Premix  Status:  Discontinued     1 g 100 mL/hr over 30 Minutes Intravenous Every 24 hours 06/14/14 0042 06/14/14 1206   06/15/14 0100  cefTRIAXone (ROCEPHIN) 1 g in dextrose 5 % 50 mL IVPB  Status:  Discontinued     1 g 100 mL/hr over 30 Minutes Intravenous Every 24 hours 06/14/14 1207 06/16/14 0840   06/13/14 2300  cefTRIAXone (ROCEPHIN) 1 g in dextrose 5 % 50 mL IVPB     1 g 100 mL/hr over 30 Minutes Intravenous  Once 06/13/14 2246 06/14/14 0051   06/13/14 2300  azithromycin (ZITHROMAX) 500 mg in dextrose 5 % 250 mL IVPB  Status:  Discontinued     500 mg 250 mL/hr over 60 Minutes Intravenous Every 24 hours 06/13/14 2246 06/16/14 0840  Assessment: Okay for Protocol, transition from Rocephin and Zithromax to Vancomycin and Levaquin for severe CAP per MD.  Obesity/Normalized CrCl dosing protocol will be initiated with an estimated normalized CrCl = 64 ml/min.   Goal of Therapy:  Vancomycin trough level 15-20 mcg/ml  Eradicate infection.   Plan:  Vancomycin 2gm load, followed by '750mg'$  IV every 12 hours Measure antibiotic drug levels at steady state Follow up culture results  [name] 06/16/2014,9:52 AM

## 2014-06-17 ENCOUNTER — Ambulatory Visit (HOSPITAL_COMMUNITY): Payer: 59 | Admitting: Occupational Therapy

## 2014-06-17 LAB — CBC
HCT: 44.7 % (ref 39.0–52.0)
Hemoglobin: 13.8 g/dL (ref 13.0–17.0)
MCH: 29.2 pg (ref 26.0–34.0)
MCHC: 30.9 g/dL (ref 30.0–36.0)
MCV: 94.5 fL (ref 78.0–100.0)
PLATELETS: 188 10*3/uL (ref 150–400)
RBC: 4.73 MIL/uL (ref 4.22–5.81)
RDW: 13.9 % (ref 11.5–15.5)
WBC: 5.1 10*3/uL (ref 4.0–10.5)

## 2014-06-17 LAB — COMPREHENSIVE METABOLIC PANEL
ALK PHOS: 63 U/L (ref 38–126)
ALT: 14 U/L — ABNORMAL LOW (ref 17–63)
AST: 26 U/L (ref 15–41)
Albumin: 3.6 g/dL (ref 3.5–5.0)
Anion gap: 8 (ref 5–15)
BUN: 26 mg/dL — ABNORMAL HIGH (ref 6–20)
CO2: 31 mmol/L (ref 22–32)
CREATININE: 1.53 mg/dL — AB (ref 0.61–1.24)
Calcium: 8.6 mg/dL — ABNORMAL LOW (ref 8.9–10.3)
Chloride: 99 mmol/L — ABNORMAL LOW (ref 101–111)
GFR calc Af Amer: 56 mL/min — ABNORMAL LOW (ref >60)
GFR, EST NON AFRICAN AMERICAN: 48 mL/min — AB (ref >60)
Glucose, Bld: 104 mg/dL — ABNORMAL HIGH (ref 70–99)
Potassium: 4 mmol/L (ref 3.5–5.1)
Sodium: 138 mmol/L (ref 135–145)
TOTAL PROTEIN: 7.7 g/dL (ref 6.5–8.1)
Total Bilirubin: 1.1 mg/dL (ref 0.3–1.2)

## 2014-06-17 NOTE — Progress Notes (Signed)
Subjective: The patient had a fairly good night. He still are running a little fever has had occasional cough. Chest x-ray did show evidence of bilateral pneumonia. He is being evaluated for bronchogenic carcinoma as well.  Objective: Vital signs in last 24 hours: Temp:  [98.4 F (36.9 C)-101.3 F (38.5 C)] 98.4 F (36.9 C) (05/02 0444) Pulse Rate:  [88-117] 88 (05/02 0444) Resp:  [18-21] 20 (05/02 0444) BP: (125-133)/(71-76) 133/76 mmHg (05/02 0444) SpO2:  [86 %-100 %] 97 % (05/02 0444) Weight change:  Last BM Date: 06/16/14  Intake/Output from previous day: 05/01 0701 - 05/02 0700 In: 950 [P.O.:800; IV Piggyback:150] Out: 1000 [Urine:1000] Intake/Output this shift: Total I/O In: 320 [P.O.:320] Out: 1000 [Urine:1000]  Physical Exam: General appearance patient patient appears alert and cooperative  HEENT negative  Neck supple no JVD or thyroid abnormalities  Heart regular rhythm no murmurs  Lungs the patient has occasional rhonchus over lower lung field  Abdomen no palpable organs or masses  Extremities free of edema  No results for input(s): WBC, HGB, HCT, PLT in the last 72 hours. BMET No results for input(s): NA, K, CL, CO2, GLUCOSE, BUN, CREATININE, CALCIUM in the last 72 hours.  Studies/Results: No results found.  Medications:  . albuterol  2.5 mg Nebulization TID  . cloNIDine  0.1 mg Oral BID  . enoxaparin (LOVENOX) injection  40 mg Subcutaneous Q24H  . furosemide  20 mg Intravenous Q12H  . irbesartan  300 mg Oral Daily   And  . hydrochlorothiazide  25 mg Oral Daily  . levofloxacin (LEVAQUIN) IV  750 mg Intravenous Q24H  . potassium chloride  20 mEq Oral Daily  . senna-docusate  2 tablet Oral Daily  . vancomycin  750 mg Intravenous Q12H        Assessment/Plan: 1. Community-acquired pneumonia-the patient was changed to vancomycin and Levaquin intravenously-he continues to receive neb treatments and is back on nasal O2-O2 sats in normal range  2.  Right hilar prominence rule out bronchogenic malignancy-patient will be seen by pulmonology for consideration of bronchoscopy and CT PET scan-2 continue current medications   LOS: 4 days   [name] G 06/17/2014, 6:06 AM

## 2014-06-17 NOTE — Progress Notes (Signed)
Subjective:   Objective: Vital signs in last 24 hours: Temp:  [98.4 F (36.9 C)-101.3 F (38.5 C)] 98.4 F (36.9 C) (05/02 0444) Pulse Rate:  [88-117] 88 (05/02 0444) Resp:  [18-21] 20 (05/02 0444) BP: (125-133)/(71-76) 133/76 mmHg (05/02 0444) SpO2:  [86 %-100 %] 97 % (05/02 0444) Weight change:  Last BM Date: 06/16/14  Intake/Output from previous day: 05/01 0701 - 05/02 0700 In: 950 [P.O.:800; IV Piggyback:150] Out: 1000 [Urine:1000] Intake/Output this shift: Total I/O In: 320 [P.O.:320] Out: 1000 [Urine:1000]  Physical Exam:   No results for input(s): WBC, HGB, HCT, PLT in the last 72 hours. BMET No results for input(s): NA, K, CL, CO2, GLUCOSE, BUN, CREATININE, CALCIUM in the last 72 hours.  Studies/Results: No results found.  Medications:  . albuterol  2.5 mg Nebulization TID  . cloNIDine  0.1 mg Oral BID  . enoxaparin (LOVENOX) injection  40 mg Subcutaneous Q24H  . furosemide  20 mg Intravenous Q12H  . irbesartan  300 mg Oral Daily   And  . hydrochlorothiazide  25 mg Oral Daily  . levofloxacin (LEVAQUIN) IV  750 mg Intravenous Q24H  . potassium chloride  20 mEq Oral Daily  . senna-docusate  2 tablet Oral Daily  . vancomycin  750 mg Intravenous Q12H        Assessment/Plan:    LOS: 4 days   Nyashia Raney G 06/17/2014, 6:03 AM

## 2014-06-17 NOTE — Consult Note (Signed)
Full note to follow. He will need bronchoscopy when better from pneumonia. I'm not doing non emergency bronchoscopies here temporarily due to the OR remodeling and lack of a room. I will arrange for it to be done in [place].

## 2014-06-17 NOTE — Care Management Note (Signed)
    Page 1 of 1   06/17/2014     3:21:52 PM CARE MANAGEMENT NOTE 06/17/2014  Patient:  [name], [name]   Account Number:  [id number]  Date Initiated:  06/17/2014  Documentation initiated by:  Theophilus Kinds  Subjective/Objective Assessment:   Pt admitted from home with pneumonia. Pt lives with his wife and will return home at discharge. Pt has been independent with ADL's.     Action/Plan:   Will need to assess for home O2 prior to discharge. Will continue to follow for discharge planning needs.   Anticipated DC Date:  06/20/2014   Anticipated DC Plan:  [place]  CM consult      Choice offered to / List presented to:             Status of service:  In process, will continue to follow Medicare Important Message given?   (If response is "NO", the following Medicare IM given date fields will be blank) Date Medicare IM given:   Medicare IM given by:   Date Additional Medicare IM given:   Additional Medicare IM given by:    Discharge Disposition:  HOME/SELF CARE  Per UR Regulation:    If discussed at Long Length of Stay Meetings, dates discussed:    Comments:  06/17/14 [employer], RN BSN CM

## 2014-06-18 LAB — CULTURE, BLOOD (ROUTINE X 2)
CULTURE: NO GROWTH
Culture: NO GROWTH

## 2014-06-18 NOTE — Progress Notes (Signed)
UR chart review completed.  

## 2014-06-18 NOTE — Progress Notes (Signed)
Subjective: The patient had a fairly good night. He is still running low-grade fever. O2 sat 95%. Chest x-ray did show evidence of bilateral pneumonia. He is being evaluated for bronchogenic carcinoma as well. He was seen by pulmonology.  Objective: Vital signs in last 24 hours: Temp:  [100.2 F (37.9 C)] 100.2 F (37.9 C) (05/02 2326) Pulse Rate:  [110-113] 110 (05/02 2326) Resp:  [20] 20 (05/02 2326) BP: (111-128)/(69-71) 128/71 mmHg (05/02 2326) SpO2:  [85 %-95 %] 95 % (05/02 2326) Weight change:  Last BM Date: 06/17/14  Intake/Output from previous day: 05/02 0701 - 05/03 0700 In: 690 [P.O.:240; IV Piggyback:450] Out: -  Intake/Output this shift: Total I/O In: 390 [P.O.:240; IV Piggyback:150] Out: -   Physical Exam: Gen. appearance patient appears alert and oriented and cooperative  HEENT negative  Neck supple no JVD or thyroid abnormalities  Heart regular rhythm no murmurs  Lungs occasional rhonchus over lower lung field  Abdomen the palpable organs or masses  Extremities free of edema   Recent Labs  06/17/14 0548  WBC 5.1  HGB 13.8  HCT 44.7  PLT 188   BMET  Recent Labs  06/17/14 0548  NA 138  K 4.0  CL 99*  CO2 31  GLUCOSE 104*  BUN 26*  CREATININE 1.53*  CALCIUM 8.6*    Studies/Results: No results found.  Medications:  . albuterol  2.5 mg Nebulization TID  . cloNIDine  0.1 mg Oral BID  . enoxaparin (LOVENOX) injection  40 mg Subcutaneous Q24H  . furosemide  20 mg Intravenous Q12H  . irbesartan  300 mg Oral Daily   And  . hydrochlorothiazide  25 mg Oral Daily  . levofloxacin (LEVAQUIN) IV  750 mg Intravenous Q24H  . potassium chloride  20 mEq Oral Daily  . senna-docusate  2 tablet Oral Daily  . vancomycin  750 mg Intravenous Q12H        Assessment/Plan: Community-acquired pneumonia-the patient continues on vancomycin and Levaquin intravenously and then treatments  Right hilar prominence rule out bronchogenic malignancy-patient  is being seen by pulmonology and bronchoscopy is to be scheduled. This will be done by pulmonology   LOS: 5 days   Stepfon Rawles G 06/18/2014, 6:20 AM

## 2014-06-18 NOTE — Progress Notes (Signed)
He says he feels about the same. He is still coughing. Has some fever. He should have bronchoscopy done once he is better with the pneumonia and this can be done as an outpatient. When he is ready for discharge I will make arrangements for him to have this done in [place].

## 2014-06-19 ENCOUNTER — Inpatient Hospital Stay (HOSPITAL_COMMUNITY): Payer: 59

## 2014-06-19 ENCOUNTER — Encounter (HOSPITAL_COMMUNITY): Payer: 59 | Admitting: Occupational Therapy

## 2014-06-19 LAB — CBC WITH DIFFERENTIAL/PLATELET
Basophils Absolute: 0 10*3/uL (ref 0.0–0.1)
Basophils Relative: 0 % (ref 0–1)
EOS ABS: 0 10*3/uL (ref 0.0–0.7)
Eosinophils Relative: 0 % (ref 0–5)
HCT: 44.2 % (ref 39.0–52.0)
Hemoglobin: 14.1 g/dL (ref 13.0–17.0)
LYMPHS ABS: 1.1 10*3/uL (ref 0.7–4.0)
Lymphocytes Relative: 19 % (ref 12–46)
MCH: 29.6 pg (ref 26.0–34.0)
MCHC: 31.9 g/dL (ref 30.0–36.0)
MCV: 92.7 fL (ref 78.0–100.0)
MONO ABS: 0.5 10*3/uL (ref 0.1–1.0)
Monocytes Relative: 8 % (ref 3–12)
Neutro Abs: 4.4 10*3/uL (ref 1.7–7.7)
Neutrophils Relative %: 73 % (ref 43–77)
PLATELETS: 214 10*3/uL (ref 150–400)
RBC: 4.77 MIL/uL (ref 4.22–5.81)
RDW: 13.6 % (ref 11.5–15.5)
WBC: 6 10*3/uL (ref 4.0–10.5)

## 2014-06-19 LAB — BASIC METABOLIC PANEL
Anion gap: 9 (ref 5–15)
BUN: 40 mg/dL — ABNORMAL HIGH (ref 6–20)
CALCIUM: 8.6 mg/dL — AB (ref 8.9–10.3)
CO2: 27 mmol/L (ref 22–32)
CREATININE: 2.04 mg/dL — AB (ref 0.61–1.24)
Chloride: 97 mmol/L — ABNORMAL LOW (ref 101–111)
GFR calc Af Amer: 39 mL/min — ABNORMAL LOW (ref >60)
GFR calc non Af Amer: 34 mL/min — ABNORMAL LOW (ref >60)
GLUCOSE: 110 mg/dL — AB (ref 70–99)
POTASSIUM: 4.2 mmol/L (ref 3.5–5.1)
Sodium: 133 mmol/L — ABNORMAL LOW (ref 135–145)

## 2014-06-19 MED ORDER — MAGIC MOUTHWASH
5.0000 mL | Freq: Four times a day (QID) | ORAL | Status: DC
  Administered 2014-06-19 – 2014-06-23 (×14): 5 mL via ORAL
  Filled 2014-06-19 (×13): qty 5

## 2014-06-19 MED ORDER — FUROSEMIDE 10 MG/ML IJ SOLN
20.0000 mg | Freq: Every day | INTRAMUSCULAR | Status: DC
  Administered 2014-06-20 – 2014-06-23 (×4): 20 mg via INTRAVENOUS
  Filled 2014-06-19 (×5): qty 2

## 2014-06-19 MED ORDER — VANCOMYCIN HCL IN DEXTROSE 750-5 MG/150ML-% IV SOLN
750.0000 mg | INTRAVENOUS | Status: DC
  Administered 2014-06-20 – 2014-06-21 (×2): 750 mg via INTRAVENOUS
  Filled 2014-06-19 (×5): qty 150

## 2014-06-19 MED ORDER — LEVOFLOXACIN IN D5W 750 MG/150ML IV SOLN
750.0000 mg | INTRAVENOUS | Status: DC
  Administered 2014-06-21: 750 mg via INTRAVENOUS
  Filled 2014-06-19: qty 150

## 2014-06-19 NOTE — Progress Notes (Signed)
ANTIBIOTIC CONSULT NOTE  Pharmacy Consult for Vancomycin and Levaquin Indication: pneumonia  No Known Allergies  Patient Measurements: Height: '6\' 1"'$  (185.4 cm) Weight: 240 lb 4.8 oz (109 kg) IBW/kg (Calculated) : 79.9  Vital Signs: Temp: 100.1 F (37.8 C) (05/04 0610) Temp Source: Oral (05/04 0610) BP: 109/65 mmHg (05/04 0610) Pulse Rate: 110 (05/04 0610) Intake/Output from previous day: 05/03 0701 - 05/04 0700 In: 1170 [P.O.:720; IV Piggyback:450] Out: 1250 [Urine:1250] Intake/Output from this shift: Total I/O In: 240 [P.O.:240] Out: 200 [Urine:200]  Labs:  Recent Labs  06/17/14 0548 06/19/14 0645  WBC 5.1 6.0  HGB 13.8 14.1  PLT 188 214  CREATININE 1.53* 2.04*   Estimated Creatinine Clearance: 50.5 mL/min (by C-G formula based on Cr of 2.04). No results for input(s): VANCOTROUGH, VANCOPEAK, VANCORANDOM, GENTTROUGH, GENTPEAK, GENTRANDOM, TOBRATROUGH, TOBRAPEAK, TOBRARND, AMIKACINPEAK, AMIKACINTROU, AMIKACIN in the last 72 hours.   Microbiology: Recent Results (from the past 720 hour(s))  Blood culture (routine x 2)     Status: None   Collection Time: 06/13/14 10:42 PM  Result Value Ref Range Status   Specimen Description BLOOD LEFT ANTECUBITAL  Final   Special Requests BOTTLES DRAWN AEROBIC AND ANAEROBIC [place]  Final   Culture NO GROWTH 5 DAYS  Final   Report Status 06/18/2014 FINAL  Final  Blood culture (routine x 2)     Status: None   Collection Time: 06/13/14 10:52 PM  Result Value Ref Range Status   Specimen Description BLOOD RIGHT ANTECUBITAL  Final   Special Requests   Final    BOTTLES DRAWN AEROBIC AND ANAEROBIC AEB=5CC ANA=6CC   Culture NO GROWTH 5 DAYS  Final   Report Status 06/18/2014 FINAL  Final  MRSA PCR Screening     Status: None   Collection Time: 06/14/14  1:00 AM  Result Value Ref Range Status   MRSA by PCR NEGATIVE NEGATIVE Final    Comment:        The GeneXpert MRSA Assay (FDA approved for NASAL specimens only), is one component  of a comprehensive MRSA colonization surveillance program. It is not intended to diagnose MRSA infection nor to guide or monitor treatment for MRSA infections.     Anti-infectives    Start     Dose/Rate Route Frequency Ordered Stop   06/21/14 0800  levofloxacin (LEVAQUIN) IVPB 750 mg     750 mg 100 mL/hr over 90 Minutes Intravenous Every 48 hours 06/19/14 1116     06/20/14 1000  vancomycin (VANCOCIN) IVPB 750 mg/150 ml premix     750 mg 150 mL/hr over 60 Minutes Intravenous Every 24 hours 06/19/14 1114     06/16/14 2200  vancomycin (VANCOCIN) IVPB 750 mg/150 ml premix  Status:  Discontinued     750 mg 150 mL/hr over 60 Minutes Intravenous Every 12 hours 06/16/14 0956 06/19/14 1114   06/16/14 0945  vancomycin (VANCOCIN) 2,000 mg in sodium chloride 0.9 % 500 mL IVPB     2,000 mg 250 mL/hr over 120 Minutes Intravenous  Once 06/16/14 0934 06/16/14 1226   06/16/14 0845  levofloxacin (LEVAQUIN) IVPB 750 mg  Status:  Discontinued     750 mg 100 mL/hr over 90 Minutes Intravenous Every 24 hours 06/16/14 0840 06/19/14 1116   06/15/14 0100  cefTRIAXone (ROCEPHIN) 1 g in dextrose 5 % 50 mL IVPB - Premix  Status:  Discontinued     1 g 100 mL/hr over 30 Minutes Intravenous Every 24 hours 06/14/14 0042 06/14/14 1206   06/15/14 0100  cefTRIAXone (ROCEPHIN) 1 g in dextrose 5 % 50 mL IVPB  Status:  Discontinued     1 g 100 mL/hr over 30 Minutes Intravenous Every 24 hours 06/14/14 1207 06/16/14 0840   06/13/14 2300  cefTRIAXone (ROCEPHIN) 1 g in dextrose 5 % 50 mL IVPB     1 g 100 mL/hr over 30 Minutes Intravenous  Once 06/13/14 2246 06/14/14 0051   06/13/14 2300  azithromycin (ZITHROMAX) 500 mg in dextrose 5 % 250 mL IVPB  Status:  Discontinued     500 mg 250 mL/hr over 60 Minutes Intravenous Every 24 hours 06/13/14 2246 06/16/14 0840     Assessment: Okay for Protocol, transition from Rocephin and Zithromax to Vancomycin and Levaquin for severe CAP per MD.  Obesity/Normalized CrCl dosing  protocol was initiated. SCr continues to worsen.  Estimated Normalized ClCr ~ 35-79m/min   Goal of Therapy:  Vancomycin trough level 15-20 mcg/ml  Eradicate infection.   Plan:   Decrease Vancomycin to '750mg'$  IV q24hrs  Decrease Levaquin to '750mg'$  IV q48hrs  Monitor labs, cultures and renal fxn  Deescalate ABX when clinically appropriate  Duration of ABX Rx per MD  HHart RobinsonsA 06/19/2014,11:17 AM

## 2014-06-19 NOTE — Progress Notes (Signed)
Subjective: He says he feels okay. He continues to have episodes of hypoxia during the night. His wife is in the room with him this morning and she says that he has significant problem with snoring and she has noticed that he stops breathing during sleep as well. He is not coughing now. He says he is less short of breath.  Objective: Vital signs in last 24 hours: Temp:  [98.2 F (36.8 C)-100.2 F (37.9 C)] 100.1 F (37.8 C) (05/04 0610) Pulse Rate:  [104-111] 110 (05/04 0610) Resp:  [20] 20 (05/04 0610) BP: (97-109)/(62-69) 109/65 mmHg (05/04 0610) SpO2:  [70 %-97 %] 77 % (05/04 0734) FiO2 (%):  [21 %] 21 % (05/04 0734) Weight change:  Last BM Date: 06/18/14  Intake/Output from previous day: 05/03 0701 - 05/04 0700 In: 1170 [P.O.:720; IV Piggyback:450] Out: 1250 [Urine:1250]  PHYSICAL EXAM General appearance: alert, cooperative and no distress Resp: clear to auscultation bilaterally Cardio: regular rate and rhythm, S1, S2 normal, no murmur, click, rub or gallop GI: soft, non-tender; bowel sounds normal; no masses,  no organomegaly Extremities: extremities normal, atraumatic, no cyanosis or edema  Lab Results:  Results for orders placed or performed during the hospital encounter of 06/13/14 (from the past 48 hour(s))  Basic metabolic panel     Status: Abnormal   Collection Time: 06/19/14  6:45 AM  Result Value Ref Range   Sodium 133 (L) 135 - 145 mmol/L   Potassium 4.2 3.5 - 5.1 mmol/L   Chloride 97 (L) 101 - 111 mmol/L   CO2 27 22 - 32 mmol/L   Glucose, Bld 110 (H) 70 - 99 mg/dL   BUN 40 (H) 6 - 20 mg/dL   Creatinine, Ser 2.04 (H) 0.61 - 1.24 mg/dL   Calcium 8.6 (L) 8.9 - 10.3 mg/dL   GFR calc non Af Amer 34 (L) >60 mL/min   GFR calc Af Amer 39 (L) >60 mL/min    Comment: (NOTE) The eGFR has been calculated using the CKD EPI equation. This calculation has not been validated in all clinical situations. eGFR's persistently <90 mL/min signify possible Chronic  Kidney Disease.    Anion gap 9 5 - 15  CBC with Differential/Platelet     Status: None   Collection Time: 06/19/14  6:45 AM  Result Value Ref Range   WBC 6.0 4.0 - 10.5 K/uL   RBC 4.77 4.22 - 5.81 MIL/uL   Hemoglobin 14.1 13.0 - 17.0 g/dL   HCT 44.2 39.0 - 52.0 %   MCV 92.7 78.0 - 100.0 fL   MCH 29.6 26.0 - 34.0 pg   MCHC 31.9 30.0 - 36.0 g/dL   RDW 13.6 11.5 - 15.5 %   Platelets 214 150 - 400 K/uL   Neutrophils Relative % 73 43 - 77 %   Neutro Abs 4.4 1.7 - 7.7 K/uL   Lymphocytes Relative 19 12 - 46 %   Lymphs Abs 1.1 0.7 - 4.0 K/uL   Monocytes Relative 8 3 - 12 %   Monocytes Absolute 0.5 0.1 - 1.0 K/uL   Eosinophils Relative 0 0 - 5 %   Eosinophils Absolute 0.0 0.0 - 0.7 K/uL   Basophils Relative 0 0 - 1 %   Basophils Absolute 0.0 0.0 - 0.1 K/uL    ABGS No results for input(s): PHART, PO2ART, TCO2, HCO3 in the last 72 hours.  Invalid input(s): PCO2 CULTURES Recent Results (from the past 240 hour(s))  Blood culture (routine x 2)  Status: None   Collection Time: 06/13/14 10:42 PM  Result Value Ref Range Status   Specimen Description BLOOD LEFT ANTECUBITAL  Final   Special Requests BOTTLES DRAWN AEROBIC AND ANAEROBIC 6CC EACH  Final   Culture NO GROWTH 5 DAYS  Final   Report Status 06/18/2014 FINAL  Final  Blood culture (routine x 2)     Status: None   Collection Time: 06/13/14 10:52 PM  Result Value Ref Range Status   Specimen Description BLOOD RIGHT ANTECUBITAL  Final   Special Requests   Final    BOTTLES DRAWN AEROBIC AND ANAEROBIC AEB=5CC ANA=6CC   Culture NO GROWTH 5 DAYS  Final   Report Status 06/18/2014 FINAL  Final  MRSA PCR Screening     Status: None   Collection Time: 06/14/14  1:00 AM  Result Value Ref Range Status   MRSA by PCR NEGATIVE NEGATIVE Final    Comment:        The GeneXpert MRSA Assay (FDA approved for NASAL specimens only), is one component of a comprehensive MRSA colonization surveillance program. It is not intended to diagnose  MRSA infection nor to guide or monitor treatment for MRSA infections.    Studies/Results: No results found.  Medications:  Prior to Admission:  Prescriptions prior to admission  Medication Sig Dispense Refill Last Dose  . albuterol (PROVENTIL HFA;VENTOLIN HFA) 108 (90 BASE) MCG/ACT inhaler Inhale into the lungs every 6 (six) hours as needed for wheezing or shortness of breath.   06/13/2014 at Unknown time  . baclofen (LIORESAL) 10 MG tablet Take 1 tablet (10 mg total) by mouth 3 (three) times daily. As needed for muscle spasm 50 tablet 0 Past Week at Unknown time  . cloNIDine (CATAPRES) 0.1 MG tablet Take 1 tablet (0.1 mg total) by mouth 2 (two) times daily. 60 tablet 0 06/12/2014 at Unknown time  . dextromethorphan-guaiFENesin (MUCINEX DM) 30-600 MG per 12 hr tablet Take 1 tablet by mouth 2 (two) times daily.   06/13/2014 at Unknown time  . HYDROMET 5-1.5 MG/5ML syrup Take 5 mLs by mouth every 4 (four) hours as needed.  0 06/12/2014 at Unknown time  . LEVITRA 20 MG tablet Take 20 mg by mouth daily as needed. For intercourse  2 Past Month at Unknown time  . levofloxacin (LEVAQUIN) 500 MG tablet Take 500 mg by mouth daily.   06/13/2014 at Unknown time  . oxyCODONE-acetaminophen (PERCOCET/ROXICET) 5-325 MG per tablet Take 1 tablet by mouth every 4 (four) hours as needed for severe pain. 15 tablet 0 06/12/2014 at Unknown time  . sennosides-docusate sodium (SENOKOT-S) 8.6-50 MG tablet Take 2 tablets by mouth daily. 30 tablet 1 06/12/2014 at Unknown time  . valsartan-hydrochlorothiazide (DIOVAN-HCT) 320-25 MG per tablet Take 1 tablet by mouth daily.   06/13/2014 at Unknown time  . ondansetron (ZOFRAN) 4 MG tablet Take 1 tablet (4 mg total) by mouth every 8 (eight) hours as needed for nausea or vomiting. (Patient not taking: Reported on 06/13/2014) 30 tablet 0 Taking   Scheduled: . albuterol  2.5 mg Nebulization TID  . cloNIDine  0.1 mg Oral BID  . enoxaparin (LOVENOX) injection  40 mg Subcutaneous Q24H   . furosemide  20 mg Intravenous Q12H  . irbesartan  300 mg Oral Daily   And  . hydrochlorothiazide  25 mg Oral Daily  . levofloxacin (LEVAQUIN) IV  750 mg Intravenous Q24H  . potassium chloride  20 mEq Oral Daily  . senna-docusate  2 tablet Oral Daily  .  vancomycin  750 mg Intravenous Q12H   Continuous:  FUX:NATFTDDUKGURK, albuterol, labetalol, ondansetron **OR** ondansetron (ZOFRAN) IV  Assesment: He was admitted with community-acquired pneumonia. He is being treated for that and seems to be improving. He also has abnormalities on chest CT that could indicate bronchogenic carcinoma and that will need to be worked up with bronchoscopy as an outpatient.  I think he has sleep apnea and will need to have outpatient sleep study as well. Principal Problem:   CAP (community acquired pneumonia) Active Problems:   Hypoxia   Wheezing    Plan: Continue treatments. He has had a chest x-ray ordered. He will need to go home on oxygen at least temporarily    LOS: 6 days   Rajiv Parlato L 06/19/2014, 8:41 AM

## 2014-06-19 NOTE — Progress Notes (Signed)
Notified MD that patient was complaining of his tongue feeling raw. Upon assessment patients tongue was very red and patient stated it was very painful. MD ordered for scheduled magic mouth wash. Will continue to monitor patient at this time.

## 2014-06-19 NOTE — Consult Note (Signed)
NAME:  [name], SCHWARTZ NO.:  [id number]  MEDICAL RECORD NO.:  [id number]  LOCATION:  A336                          FACILITY:  [facility]  PHYSICIAN:  Bahja Bence L. Luan Pulling, M.D.DATE OF BIRTH:  September 11, 1955  DATE OF CONSULTATION:  06/17/2014 DATE OF DISCHARGE:                                CONSULTATION   The patient of Dr. Everette Rank.  REASON FOR CONSULTATION:  Abnormal chest CT.  HISTORY:  This is a 59 year old who came to the emergency department because of shortness of breath.  When he was evaluated in the emergency room, he had wheezing hypoxia and cough.  He was treated and chest x-ray showed interstitial pattern.  He has a history of previous smoking, stopped about 3 years ago.  He has a history of hypertension.  He has also had a rotator cuff problem that was repaired in March of this year.  MEDICATIONS:  At home include: 1. Albuterol inhaler. 2. Baclofen 10 mg 3 times a day as needed for muscle spasm. 3. Clonidine 0.1 b.i.d. 4. Mucinex DM as needed. 5. Hydromet cough syrup as needed. 6. Levitra 20 mg as needed. 7. Levaquin 500 mg daily. 8. Percocet 5/325 as needed for severe pain. 9. Senokot as needed. 10.Zofran as needed.  ALLERGIES:  He is not allergic to any medications.  SOCIAL HISTORY:  He stopped smoking 3 to 4 years ago, has about a 20 pack year smoking history.  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  He does not use any alcohol or illicit drugs.  He is unaware of any family history of any sort of lung problems.  REVIEW OF SYSTEMS:  He denies any hemoptysis, weight loss, chest pain, night sweats.  The rest is per the history and physical.  PHYSICAL EXAMINATION:  GENERAL:  He is awake and alert. HEENT:  Pupils are reactive.  Nose and throat are clear.  Mucous membranes are moist. CHEST:  Some rhonchi bilaterally. HEART:  Regular without gallop. ABDOMEN:  Soft.  No masses are felt.  Bowel sounds are present  and active. EXTREMITIES:  No edema. CENTRAL NERVOUS SYSTEM:  Grossly intact.  He had CT of the chest done and this shows prominent right hilar lymphoid tissue.  He did not have pulmonary embolus.  He had what appeared to be pneumonia.  ASSESSMENT:  He has abnormal chest CT with some concern for bronchogenic carcinoma.  He has been hypoxic and will need to improve from his pneumonia before he undergoes bronchoscopy.  I am not doing elective bronchoscopies here for right now because the operating room is being remodeled and I do not have a negative pressure room that I can count on being available.  I will arrange to have this done with one of my pulmonary colleagues in [place] when he is better.     Kalani Baray L. Luan Pulling, M.D.     ELH/MEDQ  D:  06/19/2014  T:  06/19/2014  Job:  [id number]

## 2014-06-19 NOTE — Progress Notes (Signed)
Subjective: The patient had a fairly good night. He still running low-grade fever and coughing intermittently. His O2 sats at times have been under 90. He does have bilateral pneumonia and is being evaluated for possible bronchogenic carcinoma as well. He remains on IV antibiotics Levaquin and vancomycin  Objective: Vital signs in last 24 hours: Temp:  [97.8 F (36.6 C)-100.2 F (37.9 C)] 100.2 F (37.9 C) (05/03 2121) Pulse Rate:  [104-111] 111 (05/03 2121) Resp:  [20] 20 (05/03 2121) BP: (97-123)/(62-80) 106/69 mmHg (05/03 2121) SpO2:  [70 %-97 %] 70 % (05/04 0146) Weight change:  Last BM Date: 06/18/14  Intake/Output from previous day: 05/03 0701 - 05/04 0700 In: 1170 [P.O.:720; IV Piggyback:450] Out: 1250 [Urine:1250] Intake/Output this shift: Total I/O In: 150 [IV Piggyback:150] Out: 1050 [Urine:1050]  Physical Exam: Gen. appearance-the patient appears alert and oriented and cooperative  HEENT negative  Neck supple no JVD or thyroid abnormalities  Heart regular rhythm no murmurs  Lungs occasional rhonchus heard over lower lung field  Abdomen no palpable organs or masses  Extremities free of edema   Recent Labs  06/17/14 0548  WBC 5.1  HGB 13.8  HCT 44.7  PLT 188   BMET  Recent Labs  06/17/14 0548  NA 138  K 4.0  CL 99*  CO2 31  GLUCOSE 104*  BUN 26*  CREATININE 1.53*  CALCIUM 8.6*    Studies/Results: No results found.  Medications:  . albuterol  2.5 mg Nebulization TID  . cloNIDine  0.1 mg Oral BID  . enoxaparin (LOVENOX) injection  40 mg Subcutaneous Q24H  . furosemide  20 mg Intravenous Q12H  . irbesartan  300 mg Oral Daily   And  . hydrochlorothiazide  25 mg Oral Daily  . levofloxacin (LEVAQUIN) IV  750 mg Intravenous Q24H  . potassium chloride  20 mEq Oral Daily  . senna-docusate  2 tablet Oral Daily  . vancomycin  750 mg Intravenous Q12H        Assessment/Plan: 1. Community-acquired pneumonia-patient continues on vancomycin  and Levaquin intravenously plan to repeat chest x-ray  2. Right hilar prominence rule out bronchogenic malignancy-to proceed with further evaluation of this problem possibly as outpatient   LOS: 6 days   Argus Caraher G 06/19/2014, 6:13 AM

## 2014-06-20 LAB — BASIC METABOLIC PANEL
ANION GAP: 8 (ref 5–15)
BUN: 44 mg/dL — ABNORMAL HIGH (ref 6–20)
CALCIUM: 8.8 mg/dL — AB (ref 8.9–10.3)
CO2: 29 mmol/L (ref 22–32)
Chloride: 97 mmol/L — ABNORMAL LOW (ref 101–111)
Creatinine, Ser: 1.92 mg/dL — ABNORMAL HIGH (ref 0.61–1.24)
GFR calc Af Amer: 42 mL/min — ABNORMAL LOW (ref >60)
GFR, EST NON AFRICAN AMERICAN: 37 mL/min — AB (ref >60)
Glucose, Bld: 105 mg/dL — ABNORMAL HIGH (ref 70–99)
Potassium: 4.8 mmol/L (ref 3.5–5.1)
SODIUM: 134 mmol/L — AB (ref 135–145)

## 2014-06-20 MED ORDER — DEXTROSE 5 % IV SOLN
2.0000 g | INTRAVENOUS | Status: DC
  Administered 2014-06-20 – 2014-06-22 (×3): 2 g via INTRAVENOUS
  Filled 2014-06-20 (×5): qty 2

## 2014-06-20 MED ORDER — METHYLPREDNISOLONE SODIUM SUCC 40 MG IJ SOLR
40.0000 mg | Freq: Four times a day (QID) | INTRAMUSCULAR | Status: DC
  Administered 2014-06-20 – 2014-06-22 (×9): 40 mg via INTRAVENOUS
  Filled 2014-06-20 (×10): qty 1

## 2014-06-20 MED ORDER — ENOXAPARIN SODIUM 60 MG/0.6ML ~~LOC~~ SOLN
50.0000 mg | SUBCUTANEOUS | Status: DC
  Administered 2014-06-21: 50 mg via SUBCUTANEOUS
  Filled 2014-06-20 (×2): qty 0.6

## 2014-06-20 NOTE — Consult Note (Signed)
   [facility] Inpatient Consult   06/20/2014  [name] 11/30/1955 [id number]   Patient evaluated for the [facility] program as a benefit having [facility] insurance. Spoke with Mr. Rode via phone to explain program and to see if he would be interested for HTN management. He reports his HTN is controlled and he does not feel like he needs follow up with [facility] program. Appreciative of the call. Will make inpatient RNCM aware of contact.   Marthenia Rolling, MSN-Ed, RN,BSN [facility] Liaison [phone]

## 2014-06-20 NOTE — Progress Notes (Signed)
Nutrition Brief Note  Patient has been admitted for 7 days. Visited briefly per LOS policy  Wt Readings from Last 15 Encounters:  06/15/14 240 lb 4.8 oz (109 kg)  04/19/14 245 lb (111.131 kg)  04/04/14 240 lb (108.863 kg)  03/26/14 245 lb (111.131 kg)  03/13/14 245 lb (111.131 kg)  03/11/14 245 lb (111.131 kg)  07/23/13 251 lb (113.853 kg)  07/05/13 240 lb (108.863 kg)  09/18/12 243 lb (110.224 kg)  08/21/12 243 lb (110.224 kg)  08/09/12 243 lb (110.224 kg)  08/07/12 246 lb (111.585 kg)  08/02/12 246 lb (111.585 kg)  07/20/12 243 lb (110.224 kg)  07/19/12 240 lb (108.863 kg)    Body mass index is 31.71 kg/(m^2). Patient meets criteria for Obese based on current BMI.   Pt has been eating well. No weight changes. He has a little bit of a sore mouth which he believes is an adverse reaction to abx. Will order ice cream for him.   Current diet order is Heart Healthy, patient is consuming approximately 75-100% of meals at this time. Labs and medications reviewed.   No further nutrition interventions warranted at this time. If nutrition issues arise, please consult RD.   Burtis Junes RD, LDN Nutrition Pager: [phone] 06/20/2014 4:23 PM

## 2014-06-20 NOTE — Progress Notes (Signed)
Subjective: The patient is fairly comfortable this morning he does have occasional cough and developed sore tongue secondary to antibiotics. He still hasn't low-grade fever yesterday however O2 sats are in more normal range now. Repeat chest x-ray persistent pulmonary infiltrates compatible with pneumonia.  Objective: Vital signs in last 24 hours: Temp:  [98.3 F (36.8 C)-99.5 F (37.5 C)] 98.5 F (36.9 C) (05/04 2301) Pulse Rate:  [103-109] 109 (05/04 2128) Resp:  [18] 18 (05/04 1343) BP: (107-117)/(62-69) 117/62 mmHg (05/04 2128) SpO2:  [77 %-93 %] 93 % (05/04 2128) FiO2 (%):  [21 %] 21 % (05/04 0734) Weight change:  Last BM Date: 06/19/14  Intake/Output from previous day: 05/04 0701 - 05/05 0700 In: 840 [P.O.:840] Out: 1000 [Urine:1000] Intake/Output this shift: Total I/O In: 120 [P.O.:120] Out: 200 [Urine:200]  Physical Exam: Gen. appearance-the patient appears alert and oriented  HEENT negative  Neck supple no JVD or thyroid abnormalities  Heart regular rhythm no murmurs  Lungs occasional rhonchus heard over lower lung fields  Abdomen no palpable organs or masses  Extremities free of edema   Recent Labs  06/19/14 0645  WBC 6.0  HGB 14.1  HCT 44.2  PLT 214   BMET  Recent Labs  06/19/14 0645  NA 133*  K 4.2  CL 97*  CO2 27  GLUCOSE 110*  BUN 40*  CREATININE 2.04*  CALCIUM 8.6*    Studies/Results: Dg Chest 2 View  06/19/2014   CLINICAL DATA:  Shortness of breath with exertion, admitted with BILATERAL pneumonia, former smoker, hypertension  EXAM: CHEST  2 VIEW  COMPARISON:  06/13/2014  FINDINGS: Enlargement of cardiac silhouette.  Tortuous aorta.  Pulmonary vascularity normal.  Patchy infiltrates bilaterally primarily in the mid to lower lungs favoring pneumonia.  No definite pleural effusion or pneumothorax.  Osseous structures unremarkable.  IMPRESSION: Persistent pulmonary infiltrates in the mid to lower lungs consistent with pneumonia.  When  compared to 06/13/2014, infiltrates demonstrate little interval change.   Electronically Signed   By: Lavonia Dana M.D.   On: 06/19/2014 08:39    Medications:  . albuterol  2.5 mg Nebulization TID  . cloNIDine  0.1 mg Oral BID  . enoxaparin (LOVENOX) injection  40 mg Subcutaneous Q24H  . furosemide  20 mg Intravenous Daily  . irbesartan  300 mg Oral Daily  . [START ON 06/21/2014] levofloxacin (LEVAQUIN) IV  750 mg Intravenous Q48H  . magic mouthwash  5 mL Oral QID  . potassium chloride  20 mEq Oral Daily  . senna-docusate  2 tablet Oral Daily  . vancomycin  750 mg Intravenous Q24H        Assessment/Plan: 1. Community-acquired pneumonia in recovery phase-plan to continue vancomycin and Levaquin.  2. Right hila prominence rule out bronchogenic malignancy-to proceed with further evaluation of this problem as an outpatient possible bronchoscopy  3. Elevated serum creatinine plan decrease furosemide repeat chemistries   LOS: 7 days   Renie Stelmach G 06/20/2014, 6:11 AM

## 2014-06-20 NOTE — Progress Notes (Signed)
Subjective: He continues to have difficulty. His temperature has been as high as 102.7. Chest x-ray shows bilateral patchy infiltrates similar to what was seen on his CT. He continues to have hypoxia. He is coughing some but not bringing much up  Objective: Vital signs in last 24 hours: Temp:  [98.3 F (36.8 C)-102.9 F (39.4 C)] 102.7 F (39.3 C) (05/05 0641) Pulse Rate:  [103-110] 110 (05/05 0640) Resp:  [18-20] 20 (05/05 0640) BP: (107-142)/(62-82) 142/82 mmHg (05/05 0640) SpO2:  [88 %-93 %] 88 % (05/05 0706) Weight change:  Last BM Date: 06/19/14  Intake/Output from previous day: 05/04 0701 - 05/05 0700 In: 840 [P.O.:840] Out: 1000 [Urine:1000]  PHYSICAL EXAM General appearance: alert, cooperative and mild distress Resp: rhonchi bilaterally Cardio: regular rate and rhythm, S1, S2 normal, no murmur, click, rub or gallop GI: soft, non-tender; bowel sounds normal; no masses,  no organomegaly Extremities: extremities normal, atraumatic, no cyanosis or edema  Lab Results:  Results for orders placed or performed during the hospital encounter of 06/13/14 (from the past 48 hour(s))  Basic metabolic panel     Status: Abnormal   Collection Time: 06/19/14  6:45 AM  Result Value Ref Range   Sodium 133 (L) 135 - 145 mmol/L   Potassium 4.2 3.5 - 5.1 mmol/L   Chloride 97 (L) 101 - 111 mmol/L   CO2 27 22 - 32 mmol/L   Glucose, Bld 110 (H) 70 - 99 mg/dL   BUN 40 (H) 6 - 20 mg/dL   Creatinine, Ser 2.04 (H) 0.61 - 1.24 mg/dL   Calcium 8.6 (L) 8.9 - 10.3 mg/dL   GFR calc non Af Amer 34 (L) >60 mL/min   GFR calc Af Amer 39 (L) >60 mL/min    Comment: (NOTE) The eGFR has been calculated using the CKD EPI equation. This calculation has not been validated in all clinical situations. eGFR's persistently <90 mL/min signify possible Chronic Kidney Disease.    Anion gap 9 5 - 15  CBC with Differential/Platelet     Status: None   Collection Time: 06/19/14  6:45 AM  Result Value Ref Range    WBC 6.0 4.0 - 10.5 K/uL   RBC 4.77 4.22 - 5.81 MIL/uL   Hemoglobin 14.1 13.0 - 17.0 g/dL   HCT 44.2 39.0 - 52.0 %   MCV 92.7 78.0 - 100.0 fL   MCH 29.6 26.0 - 34.0 pg   MCHC 31.9 30.0 - 36.0 g/dL   RDW 13.6 11.5 - 15.5 %   Platelets 214 150 - 400 K/uL   Neutrophils Relative % 73 43 - 77 %   Neutro Abs 4.4 1.7 - 7.7 K/uL   Lymphocytes Relative 19 12 - 46 %   Lymphs Abs 1.1 0.7 - 4.0 K/uL   Monocytes Relative 8 3 - 12 %   Monocytes Absolute 0.5 0.1 - 1.0 K/uL   Eosinophils Relative 0 0 - 5 %   Eosinophils Absolute 0.0 0.0 - 0.7 K/uL   Basophils Relative 0 0 - 1 %   Basophils Absolute 0.0 0.0 - 0.1 K/uL  Basic metabolic panel     Status: Abnormal   Collection Time: 06/20/14  7:22 AM  Result Value Ref Range   Sodium 134 (L) 135 - 145 mmol/L   Potassium 4.8 3.5 - 5.1 mmol/L   Chloride 97 (L) 101 - 111 mmol/L   CO2 29 22 - 32 mmol/L   Glucose, Bld 105 (H) 70 - 99 mg/dL   BUN 44 (  H) 6 - 20 mg/dL   Creatinine, Ser 1.92 (H) 0.61 - 1.24 mg/dL   Calcium 8.8 (L) 8.9 - 10.3 mg/dL   GFR calc non Af Amer 37 (L) >60 mL/min   GFR calc Af Amer 42 (L) >60 mL/min    Comment: (NOTE) The eGFR has been calculated using the CKD EPI equation. This calculation has not been validated in all clinical situations. eGFR's persistently <90 mL/min signify possible Chronic Kidney Disease.    Anion gap 8 5 - 15    ABGS No results for input(s): PHART, PO2ART, TCO2, HCO3 in the last 72 hours.  Invalid input(s): PCO2 CULTURES Recent Results (from the past 240 hour(s))  Blood culture (routine x 2)     Status: None   Collection Time: 06/13/14 10:42 PM  Result Value Ref Range Status   Specimen Description BLOOD LEFT ANTECUBITAL  Final   Special Requests BOTTLES DRAWN AEROBIC AND ANAEROBIC [place]  Final   Culture NO GROWTH 5 DAYS  Final   Report Status 06/18/2014 FINAL  Final  Blood culture (routine x 2)     Status: None   Collection Time: 06/13/14 10:52 PM  Result Value Ref Range Status    Specimen Description BLOOD RIGHT ANTECUBITAL  Final   Special Requests   Final    BOTTLES DRAWN AEROBIC AND ANAEROBIC AEB=5CC ANA=6CC   Culture NO GROWTH 5 DAYS  Final   Report Status 06/18/2014 FINAL  Final  MRSA PCR Screening     Status: None   Collection Time: 06/14/14  1:00 AM  Result Value Ref Range Status   MRSA by PCR NEGATIVE NEGATIVE Final    Comment:        The GeneXpert MRSA Assay (FDA approved for NASAL specimens only), is one component of a comprehensive MRSA colonization surveillance program. It is not intended to diagnose MRSA infection nor to guide or monitor treatment for MRSA infections.    Studies/Results: Dg Chest 2 View  06/19/2014   CLINICAL DATA:  Shortness of breath with exertion, admitted with BILATERAL pneumonia, former smoker, hypertension  EXAM: CHEST  2 VIEW  COMPARISON:  06/13/2014  FINDINGS: Enlargement of cardiac silhouette.  Tortuous aorta.  Pulmonary vascularity normal.  Patchy infiltrates bilaterally primarily in the mid to lower lungs favoring pneumonia.  No definite pleural effusion or pneumothorax.  Osseous structures unremarkable.  IMPRESSION: Persistent pulmonary infiltrates in the mid to lower lungs consistent with pneumonia.  When compared to 06/13/2014, infiltrates demonstrate little interval change.   Electronically Signed   By: Lavonia Dana M.D.   On: 06/19/2014 08:39    Medications:  Prior to Admission:  Prescriptions prior to admission  Medication Sig Dispense Refill Last Dose  . albuterol (PROVENTIL HFA;VENTOLIN HFA) 108 (90 BASE) MCG/ACT inhaler Inhale into the lungs every 6 (six) hours as needed for wheezing or shortness of breath.   06/13/2014 at Unknown time  . baclofen (LIORESAL) 10 MG tablet Take 1 tablet (10 mg total) by mouth 3 (three) times daily. As needed for muscle spasm 50 tablet 0 Past Week at Unknown time  . cloNIDine (CATAPRES) 0.1 MG tablet Take 1 tablet (0.1 mg total) by mouth 2 (two) times daily. 60 tablet 0 06/12/2014 at  Unknown time  . dextromethorphan-guaiFENesin (MUCINEX DM) 30-600 MG per 12 hr tablet Take 1 tablet by mouth 2 (two) times daily.   06/13/2014 at Unknown time  . HYDROMET 5-1.5 MG/5ML syrup Take 5 mLs by mouth every 4 (four) hours as needed.  0 06/12/2014 at Unknown time  . LEVITRA 20 MG tablet Take 20 mg by mouth daily as needed. For intercourse  2 Past Month at Unknown time  . levofloxacin (LEVAQUIN) 500 MG tablet Take 500 mg by mouth daily.   06/13/2014 at Unknown time  . oxyCODONE-acetaminophen (PERCOCET/ROXICET) 5-325 MG per tablet Take 1 tablet by mouth every 4 (four) hours as needed for severe pain. 15 tablet 0 06/12/2014 at Unknown time  . sennosides-docusate sodium (SENOKOT-S) 8.6-50 MG tablet Take 2 tablets by mouth daily. 30 tablet 1 06/12/2014 at Unknown time  . valsartan-hydrochlorothiazide (DIOVAN-HCT) 320-25 MG per tablet Take 1 tablet by mouth daily.   06/13/2014 at Unknown time  . ondansetron (ZOFRAN) 4 MG tablet Take 1 tablet (4 mg total) by mouth every 8 (eight) hours as needed for nausea or vomiting. (Patient not taking: Reported on 06/13/2014) 30 tablet 0 Taking   Scheduled: . albuterol  2.5 mg Nebulization TID  . ceFEPime (MAXIPIME) IV  2 g Intravenous Q24H  . cloNIDine  0.1 mg Oral BID  . enoxaparin (LOVENOX) injection  40 mg Subcutaneous Q24H  . furosemide  20 mg Intravenous Daily  . irbesartan  300 mg Oral Daily  . [START ON 06/21/2014] levofloxacin (LEVAQUIN) IV  750 mg Intravenous Q48H  . magic mouthwash  5 mL Oral QID  . methylPREDNISolone (SOLU-MEDROL) injection  40 mg Intravenous Q6H  . potassium chloride  20 mEq Oral Daily  . senna-docusate  2 tablet Oral Daily  . vancomycin  750 mg Intravenous Q24H   Continuous:  [id number], albuterol, labetalol, ondansetron **OR** ondansetron (ZOFRAN) IV  Assesment: He has community-acquired pneumonia but is not responding. He is currently on Levaquin and vancomycin and I will add cefepime. I'm going to start  steroids. Principal Problem:   CAP (community acquired pneumonia) Active Problems:   Hypoxia   Wheezing    Plan: Blood cultures if he gets another fever. Change his antibiotics as noted above. Add steroids. Try to get him up and moving around.    LOS: 7 days   Salem Mastrogiovanni L 06/20/2014, 8:32 AM

## 2014-06-20 NOTE — Progress Notes (Signed)
ANTIBIOTIC CONSULT NOTE  Pharmacy Consult for Vancomycin, Levaquin, and Cefepime Indication: pneumonia  No Known Allergies  Patient Measurements: Height: '6\' 1"'$  (185.4 cm) Weight: 240 lb 4.8 oz (109 kg) IBW/kg (Calculated) : 79.9  Vital Signs: Temp: 98.5 F (36.9 C) (05/05 0934) Temp Source: Oral (05/05 0934) BP: 103/68 mmHg (05/05 0934) Pulse Rate: 112 (05/05 0934) Intake/Output from previous day: 05/04 0701 - 05/05 0700 In: 840 [P.O.:840] Out: 1000 [Urine:1000] Intake/Output from this shift: Total I/O In: 240 [P.O.:240] Out: 450 [Urine:450]  Labs:  Recent Labs  06/19/14 0645 06/20/14 0722  WBC 6.0  --   HGB 14.1  --   PLT 214  --   CREATININE 2.04* 1.92*   Estimated Creatinine Clearance: 53.6 mL/min (by C-G formula based on Cr of 1.92). No results for input(s): VANCOTROUGH, VANCOPEAK, VANCORANDOM, GENTTROUGH, GENTPEAK, GENTRANDOM, TOBRATROUGH, TOBRAPEAK, TOBRARND, AMIKACINPEAK, AMIKACINTROU, AMIKACIN in the last 72 hours.   Microbiology: Recent Results (from the past 720 hour(s))  Blood culture (routine x 2)     Status: None   Collection Time: 06/13/14 10:42 PM  Result Value Ref Range Status   Specimen Description BLOOD LEFT ANTECUBITAL  Final   Special Requests BOTTLES DRAWN AEROBIC AND ANAEROBIC [place]  Final   Culture NO GROWTH 5 DAYS  Final   Report Status 06/18/2014 FINAL  Final  Blood culture (routine x 2)     Status: None   Collection Time: 06/13/14 10:52 PM  Result Value Ref Range Status   Specimen Description BLOOD RIGHT ANTECUBITAL  Final   Special Requests   Final    BOTTLES DRAWN AEROBIC AND ANAEROBIC AEB=5CC ANA=6CC   Culture NO GROWTH 5 DAYS  Final   Report Status 06/18/2014 FINAL  Final  MRSA PCR Screening     Status: None   Collection Time: 06/14/14  1:00 AM  Result Value Ref Range Status   MRSA by PCR NEGATIVE NEGATIVE Final    Comment:        The GeneXpert MRSA Assay (FDA approved for NASAL specimens only), is one component of  a comprehensive MRSA colonization surveillance program. It is not intended to diagnose MRSA infection nor to guide or monitor treatment for MRSA infections.   Culture, blood (routine x 2)     Status: None (Preliminary result)   Collection Time: 06/20/14  7:22 AM  Result Value Ref Range Status   Specimen Description BLOOD LEFT ANTECUBITAL  Final   Special Requests BOTTLES DRAWN AEROBIC ONLY 5CC  Final   Culture NO GROWTH <24 HRS  Final   Report Status PENDING  Incomplete  Culture, blood (routine x 2)     Status: None (Preliminary result)   Collection Time: 06/20/14  8:53 AM  Result Value Ref Range Status   Specimen Description BLOOD LEFT ARM  Final   Special Requests   Final    BOTTLES DRAWN AEROBIC AND ANAEROBIC AEB=12CC ANA=8CC   Culture NO GROWTH <24 HRS  Final   Report Status PENDING  Incomplete    Anti-infectives    Start     Dose/Rate Route Frequency Ordered Stop   06/21/14 0800  levofloxacin (LEVAQUIN) IVPB 750 mg     750 mg 100 mL/hr over 90 Minutes Intravenous Every 48 hours 06/19/14 1116     06/20/14 1000  vancomycin (VANCOCIN) IVPB 750 mg/150 ml premix     750 mg 150 mL/hr over 60 Minutes Intravenous Every 24 hours 06/19/14 1114     06/20/14 1000  ceFEPIme (MAXIPIME) 2 g  in dextrose 5 % 50 mL IVPB     2 g 100 mL/hr over 30 Minutes Intravenous Every 24 hours 06/20/14 0807     06/16/14 2200  vancomycin (VANCOCIN) IVPB 750 mg/150 ml premix  Status:  Discontinued     750 mg 150 mL/hr over 60 Minutes Intravenous Every 12 hours 06/16/14 0956 06/19/14 1114   06/16/14 0945  vancomycin (VANCOCIN) 2,000 mg in sodium chloride 0.9 % 500 mL IVPB     2,000 mg 250 mL/hr over 120 Minutes Intravenous  Once 06/16/14 0934 06/16/14 1226   06/16/14 0845  levofloxacin (LEVAQUIN) IVPB 750 mg  Status:  Discontinued     750 mg 100 mL/hr over 90 Minutes Intravenous Every 24 hours 06/16/14 0840 06/19/14 1116   06/15/14 0100  cefTRIAXone (ROCEPHIN) 1 g in dextrose 5 % 50 mL IVPB - Premix   Status:  Discontinued     1 g 100 mL/hr over 30 Minutes Intravenous Every 24 hours 06/14/14 0042 06/14/14 1206   06/15/14 0100  cefTRIAXone (ROCEPHIN) 1 g in dextrose 5 % 50 mL IVPB  Status:  Discontinued     1 g 100 mL/hr over 30 Minutes Intravenous Every 24 hours 06/14/14 1207 06/16/14 0840   06/13/14 2300  cefTRIAXone (ROCEPHIN) 1 g in dextrose 5 % 50 mL IVPB     1 g 100 mL/hr over 30 Minutes Intravenous  Once 06/13/14 2246 06/14/14 0051   06/13/14 2300  azithromycin (ZITHROMAX) 500 mg in dextrose 5 % 250 mL IVPB  Status:  Discontinued     500 mg 250 mL/hr over 60 Minutes Intravenous Every 24 hours 06/13/14 2246 06/16/14 0840     Assessment: Okay for Protocol, transition from Rocephin and Zithromax to Vancomycin and Levaquin for severe CAP per MD.  Obesity/Normalized CrCl dosing protocol was initiated. SCr elevated since admission (1.26 >> 1.92) but stable.   Estimated Normalized ClCr ~ 35-44m/min. Pt with minimal improvement and MD added Cefepime.  Blood cx pending.  Tm 102.7.  Continues to have hypoxia.  Zithromax 4/29 > 4/30 Ceftriaxone 4/29 > 5/1 Cefepime 5/5 >> Levaquin 5/1 >> Vancomycin 5/1 >>  Goal of Therapy:  Vancomycin trough level 15-20 mcg/ml  Eradicate infection.   Plan:   Vancomycin '750mg'$  IV q24hrs (recently reduced to q24hrs due to SCr rise)  Check Vancomycin trough level at steady state  Levaquin '750mg'$  IV q48hrs  Cefepime 2gm IV q24hrs  Monitor labs (SCr), cultures and progress  Deescalate ABX when clinically appropriate  Duration of therapy per MD  HHart RobinsonsA 06/20/2014,12:37 PM

## 2014-06-21 ENCOUNTER — Telehealth (HOSPITAL_COMMUNITY): Payer: Self-pay | Admitting: Occupational Therapy

## 2014-06-21 ENCOUNTER — Ambulatory Visit (HOSPITAL_COMMUNITY): Payer: 59 | Admitting: Occupational Therapy

## 2014-06-21 ENCOUNTER — Other Ambulatory Visit (HOSPITAL_COMMUNITY): Payer: Self-pay | Admitting: Respiratory Therapy

## 2014-06-21 DIAGNOSIS — G4730 Sleep apnea, unspecified: Secondary | ICD-10-CM

## 2014-06-21 LAB — BASIC METABOLIC PANEL
Anion gap: 9 (ref 5–15)
BUN: 57 mg/dL — AB (ref 6–20)
CO2: 24 mmol/L (ref 22–32)
Calcium: 9.1 mg/dL (ref 8.9–10.3)
Chloride: 100 mmol/L — ABNORMAL LOW (ref 101–111)
Creatinine, Ser: 1.75 mg/dL — ABNORMAL HIGH (ref 0.61–1.24)
GFR calc Af Amer: 47 mL/min — ABNORMAL LOW (ref >60)
GFR calc non Af Amer: 41 mL/min — ABNORMAL LOW (ref >60)
GLUCOSE: 168 mg/dL — AB (ref 70–99)
POTASSIUM: 4.7 mmol/L (ref 3.5–5.1)
Sodium: 133 mmol/L — ABNORMAL LOW (ref 135–145)

## 2014-06-21 MED ORDER — ENOXAPARIN SODIUM 40 MG/0.4ML ~~LOC~~ SOLN
40.0000 mg | SUBCUTANEOUS | Status: DC
  Administered 2014-06-22 – 2014-06-23 (×2): 40 mg via SUBCUTANEOUS
  Filled 2014-06-21 (×2): qty 0.4

## 2014-06-21 NOTE — Progress Notes (Signed)
.  agm

## 2014-06-21 NOTE — Progress Notes (Signed)
Pt ambulated in hall with NT. Pt asymptomatic but O2 levels dropped to 70% 6L [place] during ambulation. After walking recovery to 90% was reached after 3-4 minutes, still on 6L [place]. Did not attempt to wean oxygen down d/t low sats. Will monitor

## 2014-06-21 NOTE — Care Management Note (Signed)
Case Management Note  Patient Details  Name: [name] MRN: [id number] Date of Birth: 1955-05-12  Subjective/Objective:                  Pt admitted from home with pneumonia and possible lung ca. Pt lives with his wife and will return home at discharge. Pt is independent with ADL's.  Action/Plan: Anticipate discharge over the weekend. Will schedule sleep study and document on AVS. Pt will need home O2 assessment prior to discharge and pt would like home O2 with [employer]. If pt needs neb machine would like with [facility] as well. Weekend staff to arrange.  Expected Discharge Date:                  Expected Discharge Plan:  Home/Self Care  In-House Referral:  NA  Discharge planning Services  CM Consult  Post Acute Care Choice:  Durable Medical Equipment Choice offered to:  Patient  DME Arranged:  Oxygen DME Agency:  [facility]  HH Arranged:    [place] Agency:     Status of Service:  Completed, signed off  Medicare Important Message Given:    Date Medicare IM Given:    Medicare IM give by:    Date Additional Medicare IM Given:    Additional Medicare Important Message give by:     If discussed at [place] of Stay Meetings, dates discussed:    Additional Comments:  Joylene Draft, RN 06/21/2014, 2:21 PM

## 2014-06-21 NOTE — Progress Notes (Signed)
UR chart review completed.  

## 2014-06-21 NOTE — Progress Notes (Signed)
Subjective: He says he feels much better. He's been able to get up and walk around. His fever has gone away but he is on IV steroids  Objective: Vital signs in last 24 hours: Temp:  [98.1 F (36.7 C)-98.8 F (37.1 C)] 98.4 F (36.9 C) (05/06 0500) Pulse Rate:  [104-112] 110 (05/06 0500) Resp:  [18] 18 (05/06 0500) BP: (103-150)/(62-74) 150/62 mmHg (05/06 0500) SpO2:  [90 %-95 %] 90 % (05/06 0729) Weight change:  Last BM Date: 06/20/14  Intake/Output from previous day: 05/05 0701 - 05/06 0700 In: 1080 [P.O.:1080] Out: 450 [Urine:450]  PHYSICAL EXAM General appearance: alert, cooperative and no distress Resp: rhonchi bilaterally Cardio: regular rate and rhythm, S1, S2 normal, no murmur, click, rub or gallop GI: soft, non-tender; bowel sounds normal; no masses,  no organomegaly Extremities: extremities normal, atraumatic, no cyanosis or edema  Lab Results:  Results for orders placed or performed during the hospital encounter of 06/13/14 (from the past 48 hour(s))  Basic metabolic panel     Status: Abnormal   Collection Time: 06/20/14  7:22 AM  Result Value Ref Range   Sodium 134 (L) 135 - 145 mmol/L   Potassium 4.8 3.5 - 5.1 mmol/L   Chloride 97 (L) 101 - 111 mmol/L   CO2 29 22 - 32 mmol/L   Glucose, Bld 105 (H) 70 - 99 mg/dL   BUN 44 (H) 6 - 20 mg/dL   Creatinine, Ser 1.92 (H) 0.61 - 1.24 mg/dL   Calcium 8.8 (L) 8.9 - 10.3 mg/dL   GFR calc non Af Amer 37 (L) >60 mL/min   GFR calc Af Amer 42 (L) >60 mL/min    Comment: (NOTE) The eGFR has been calculated using the CKD EPI equation. This calculation has not been validated in all clinical situations. eGFR's persistently <90 mL/min signify possible Chronic Kidney Disease.    Anion gap 8 5 - 15  Culture, blood (routine x 2)     Status: None (Preliminary result)   Collection Time: 06/20/14  7:22 AM  Result Value Ref Range   Specimen Description BLOOD LEFT ANTECUBITAL    Special Requests BOTTLES DRAWN AEROBIC ONLY 5CC     Culture NO GROWTH 1 DAY    Report Status PENDING   Culture, blood (routine x 2)     Status: None (Preliminary result)   Collection Time: 06/20/14  8:53 AM  Result Value Ref Range   Specimen Description BLOOD LEFT ARM    Special Requests      BOTTLES DRAWN AEROBIC AND ANAEROBIC AEB=12CC ANA=8CC   Culture NO GROWTH 1 DAY    Report Status PENDING   Basic metabolic panel     Status: Abnormal   Collection Time: 06/21/14  7:06 AM  Result Value Ref Range   Sodium 133 (L) 135 - 145 mmol/L   Potassium 4.7 3.5 - 5.1 mmol/L   Chloride 100 (L) 101 - 111 mmol/L   CO2 24 22 - 32 mmol/L   Glucose, Bld 168 (H) 70 - 99 mg/dL   BUN 57 (H) 6 - 20 mg/dL   Creatinine, Ser 1.75 (H) 0.61 - 1.24 mg/dL   Calcium 9.1 8.9 - 10.3 mg/dL   GFR calc non Af Amer 41 (L) >60 mL/min   GFR calc Af Amer 47 (L) >60 mL/min    Comment: (NOTE) The eGFR has been calculated using the CKD EPI equation. This calculation has not been validated in all clinical situations. eGFR's persistently <60 mL/min signify possible Chronic Kidney  Disease.    Anion gap 9 5 - 15    ABGS No results for input(s): PHART, PO2ART, TCO2, HCO3 in the last 72 hours.  Invalid input(s): PCO2 CULTURES Recent Results (from the past 240 hour(s))  Blood culture (routine x 2)     Status: None   Collection Time: 06/13/14 10:42 PM  Result Value Ref Range Status   Specimen Description BLOOD LEFT ANTECUBITAL  Final   Special Requests BOTTLES DRAWN AEROBIC AND ANAEROBIC [place]  Final   Culture NO GROWTH 5 DAYS  Final   Report Status 06/18/2014 FINAL  Final  Blood culture (routine x 2)     Status: None   Collection Time: 06/13/14 10:52 PM  Result Value Ref Range Status   Specimen Description BLOOD RIGHT ANTECUBITAL  Final   Special Requests   Final    BOTTLES DRAWN AEROBIC AND ANAEROBIC AEB=5CC ANA=6CC   Culture NO GROWTH 5 DAYS  Final   Report Status 06/18/2014 FINAL  Final  MRSA PCR Screening     Status: None   Collection Time: 06/14/14   1:00 AM  Result Value Ref Range Status   MRSA by PCR NEGATIVE NEGATIVE Final    Comment:        The GeneXpert MRSA Assay (FDA approved for NASAL specimens only), is one component of a comprehensive MRSA colonization surveillance program. It is not intended to diagnose MRSA infection nor to guide or monitor treatment for MRSA infections.   Culture, blood (routine x 2)     Status: None (Preliminary result)   Collection Time: 06/20/14  7:22 AM  Result Value Ref Range Status   Specimen Description BLOOD LEFT ANTECUBITAL  Final   Special Requests BOTTLES DRAWN AEROBIC ONLY 5CC  Final   Culture NO GROWTH 1 DAY  Final   Report Status PENDING  Incomplete  Culture, blood (routine x 2)     Status: None (Preliminary result)   Collection Time: 06/20/14  8:53 AM  Result Value Ref Range Status   Specimen Description BLOOD LEFT ARM  Final   Special Requests   Final    BOTTLES DRAWN AEROBIC AND ANAEROBIC AEB=12CC ANA=8CC   Culture NO GROWTH 1 DAY  Final   Report Status PENDING  Incomplete   Studies/Results: No results found.  Medications:  Prior to Admission:  Prescriptions prior to admission  Medication Sig Dispense Refill Last Dose  . albuterol (PROVENTIL HFA;VENTOLIN HFA) 108 (90 BASE) MCG/ACT inhaler Inhale into the lungs every 6 (six) hours as needed for wheezing or shortness of breath.   06/13/2014 at Unknown time  . baclofen (LIORESAL) 10 MG tablet Take 1 tablet (10 mg total) by mouth 3 (three) times daily. As needed for muscle spasm 50 tablet 0 Past Week at Unknown time  . cloNIDine (CATAPRES) 0.1 MG tablet Take 1 tablet (0.1 mg total) by mouth 2 (two) times daily. 60 tablet 0 06/12/2014 at Unknown time  . dextromethorphan-guaiFENesin (MUCINEX DM) 30-600 MG per 12 hr tablet Take 1 tablet by mouth 2 (two) times daily.   06/13/2014 at Unknown time  . HYDROMET 5-1.5 MG/5ML syrup Take 5 mLs by mouth every 4 (four) hours as needed.  0 06/12/2014 at Unknown time  . LEVITRA 20 MG tablet Take  20 mg by mouth daily as needed. For intercourse  2 Past Month at Unknown time  . levofloxacin (LEVAQUIN) 500 MG tablet Take 500 mg by mouth daily.   06/13/2014 at Unknown time  . oxyCODONE-acetaminophen (PERCOCET/ROXICET) 5-325 MG  per tablet Take 1 tablet by mouth every 4 (four) hours as needed for severe pain. 15 tablet 0 06/12/2014 at Unknown time  . sennosides-docusate sodium (SENOKOT-S) 8.6-50 MG tablet Take 2 tablets by mouth daily. 30 tablet 1 06/12/2014 at Unknown time  . valsartan-hydrochlorothiazide (DIOVAN-HCT) 320-25 MG per tablet Take 1 tablet by mouth daily.   06/13/2014 at Unknown time  . ondansetron (ZOFRAN) 4 MG tablet Take 1 tablet (4 mg total) by mouth every 8 (eight) hours as needed for nausea or vomiting. (Patient not taking: Reported on 06/13/2014) 30 tablet 0 Taking   Scheduled: . albuterol  2.5 mg Nebulization TID  . ceFEPime (MAXIPIME) IV  2 g Intravenous Q24H  . cloNIDine  0.1 mg Oral BID  . enoxaparin (LOVENOX) injection  50 mg Subcutaneous Q24H  . furosemide  20 mg Intravenous Daily  . irbesartan  300 mg Oral Daily  . levofloxacin (LEVAQUIN) IV  750 mg Intravenous Q48H  . magic mouthwash  5 mL Oral QID  . methylPREDNISolone (SOLU-MEDROL) injection  40 mg Intravenous Q6H  . potassium chloride  20 mEq Oral Daily  . senna-docusate  2 tablet Oral Daily  . vancomycin  750 mg Intravenous Q24H   Continuous:  LGS:[id number], albuterol, labetalol, ondansetron **OR** ondansetron (ZOFRAN) IV  Assesment: He has pneumonia which has been difficult to treat. He seems to have improved with the changes in antibiotics. Everything else is going better. He is more active. Principal Problem:   CAP (community acquired pneumonia) Active Problems:   Hypoxia   Wheezing    Plan: I would continue with IV antibiotics as he is taking now for at least 48 more hours. Potential discharge on 06/23/2014 depending on how he does    LOS: 8 days   [name] L 06/21/2014, 8:46 AM

## 2014-06-21 NOTE — Progress Notes (Signed)
Subjective:   Objective: Vital signs in last 24 hours: Temp:  [97.7 F (36.5 C)-98.8 F (37.1 C)] 97.7 F (36.5 C) (05/06 0900) Pulse Rate:  [104-113] 113 (05/06 0900) Resp:  [18-21] 21 (05/06 0900) BP: (114-150)/(62-74) 142/72 mmHg (05/06 0900) SpO2:  [90 %-95 %] 95 % (05/06 0900) Weight change:  Last BM Date: 06/21/14  Intake/Output from previous day: 05/05 0701 - 05/06 0700 In: 1080 [P.O.:1080] Out: 450 [Urine:450] Intake/Output this shift:    Physical Exam:    Recent Labs  06/19/14 0645  WBC 6.0  HGB 14.1  HCT 44.2  PLT 214   BMET  Recent Labs  06/20/14 0722 06/21/14 0706  NA 134* 133*  K 4.8 4.7  CL 97* 100*  CO2 29 24  GLUCOSE 105* 168*  BUN 44* 57*  CREATININE 1.92* 1.75*  CALCIUM 8.8* 9.1    Studies/Results: No results found.  Medications:  . albuterol  2.5 mg Nebulization TID  . ceFEPime (MAXIPIME) IV  2 g Intravenous Q24H  . cloNIDine  0.1 mg Oral BID  . [START ON 06/22/2014] enoxaparin (LOVENOX) injection  40 mg Subcutaneous Q24H  . furosemide  20 mg Intravenous Daily  . irbesartan  300 mg Oral Daily  . levofloxacin (LEVAQUIN) IV  750 mg Intravenous Q48H  . magic mouthwash  5 mL Oral QID  . methylPREDNISolone (SOLU-MEDROL) injection  40 mg Intravenous Q6H  . potassium chloride  20 mEq Oral Daily  . senna-docusate  2 tablet Oral Daily  . vancomycin  750 mg Intravenous Q24H        Assessment/Plan:    LOS: 8 days   Bryen Hinderman G 06/21/2014, 11:38 AM

## 2014-06-21 NOTE — Progress Notes (Signed)
Subjective: The patient had a fairly comfortable night. He does have occasional cough. His O2 sats ranged from 90-94%. He does have pulmonary infiltrates compatible with pneumonia.  Objective: Vital signs in last 24 hours: Temp:  [97.7 F (36.5 C)-98.6 F (37 C)] 98 F (36.7 C) (05/06 1445) Pulse Rate:  [11-113] 11 (05/06 1445) Resp:  [18-21] 20 (05/06 1445) BP: (130-150)/(62-77) 132/77 mmHg (05/06 1445) SpO2:  [90 %-95 %] 94 % (05/06 1445) Weight change:  Last BM Date: 06/21/14  Intake/Output from previous day: 05/05 0701 - 05/06 0700 In: 1080 [P.O.:1080] Out: 450 [Urine:450] Intake/Output this shift: Total I/O In: 240 [P.O.:240] Out: -   Physical Exam: Gen. appearance-the patient appears alert and oriented  HEENT negative  Neck supple no JVD or thyroid abnormalities  Heart regular rhythm no murmurs  Lungs clear to P&A  Abdomen no palpable organs or masses  Extremities free of edema   Recent Labs  06/19/14 0645  WBC 6.0  HGB 14.1  HCT 44.2  PLT 214   BMET  Recent Labs  06/20/14 0722 06/21/14 0706  NA 134* 133*  K 4.8 4.7  CL 97* 100*  CO2 29 24  GLUCOSE 105* 168*  BUN 44* 57*  CREATININE 1.92* 1.75*  CALCIUM 8.8* 9.1    Studies/Results: No results found.  Medications:  . albuterol  2.5 mg Nebulization TID  . ceFEPime (MAXIPIME) IV  2 g Intravenous Q24H  . cloNIDine  0.1 mg Oral BID  . [START ON 06/22/2014] enoxaparin (LOVENOX) injection  40 mg Subcutaneous Q24H  . furosemide  20 mg Intravenous Daily  . irbesartan  300 mg Oral Daily  . levofloxacin (LEVAQUIN) IV  750 mg Intravenous Q48H  . magic mouthwash  5 mL Oral QID  . methylPREDNISolone (SOLU-MEDROL) injection  40 mg Intravenous Q6H  . potassium chloride  20 mEq Oral Daily  . senna-docusate  2 tablet Oral Daily  . vancomycin  750 mg Intravenous Q24H        Assessment/Plan: 1. Community-acquired pneumonia in recovery phase-plan to continue vancomycin and Levaquin and  Maxipime  2. Right hilar prominence rule out bronchogenic malignancy-to proceed with further evaluation of this problem as an outpatient possible bronchoscopy  3. Elevated serum creatinine-also low serum sodium plan to continue to monitor chemistries   LOS: 8 days   Liese Dizdarevic G 06/21/2014, 3:51 PM

## 2014-06-21 NOTE — Telephone Encounter (Signed)
Patient has pneumonia and can not come in for these apptments

## 2014-06-22 LAB — BASIC METABOLIC PANEL
ANION GAP: 9 (ref 5–15)
BUN: 59 mg/dL — AB (ref 6–20)
CALCIUM: 9 mg/dL (ref 8.9–10.3)
CO2: 24 mmol/L (ref 22–32)
CREATININE: 1.73 mg/dL — AB (ref 0.61–1.24)
Chloride: 101 mmol/L (ref 101–111)
GFR calc Af Amer: 48 mL/min — ABNORMAL LOW (ref >60)
GFR calc non Af Amer: 41 mL/min — ABNORMAL LOW (ref >60)
Glucose, Bld: 214 mg/dL — ABNORMAL HIGH (ref 70–99)
Potassium: 4.2 mmol/L (ref 3.5–5.1)
Sodium: 134 mmol/L — ABNORMAL LOW (ref 135–145)

## 2014-06-22 LAB — VANCOMYCIN, TROUGH: Vancomycin Tr: 10 ug/mL (ref 10.0–20.0)

## 2014-06-22 MED ORDER — LEVOFLOXACIN 750 MG PO TABS
750.0000 mg | ORAL_TABLET | Freq: Every day | ORAL | Status: DC
  Administered 2014-06-22 – 2014-06-23 (×2): 750 mg via ORAL
  Filled 2014-06-22 (×2): qty 1

## 2014-06-22 MED ORDER — PREDNISONE 20 MG PO TABS
40.0000 mg | ORAL_TABLET | Freq: Every day | ORAL | Status: DC
  Administered 2014-06-22 – 2014-06-23 (×2): 40 mg via ORAL
  Filled 2014-06-22 (×2): qty 2

## 2014-06-22 MED ORDER — DEXTROSE 5 % IV SOLN
2.0000 g | Freq: Two times a day (BID) | INTRAVENOUS | Status: DC
  Administered 2014-06-22 – 2014-06-23 (×2): 2 g via INTRAVENOUS
  Filled 2014-06-22 (×4): qty 2

## 2014-06-22 MED ORDER — VANCOMYCIN HCL 10 G IV SOLR
1250.0000 mg | INTRAVENOUS | Status: DC
  Administered 2014-06-22 – 2014-06-23 (×2): 1250 mg via INTRAVENOUS
  Filled 2014-06-22 (×3): qty 1250

## 2014-06-22 NOTE — Progress Notes (Signed)
Subjective: He looks and feels much better. He has no new complaints. He is hopeful discharge tomorrow  Objective: Vital signs in last 24 hours: Temp:  [97.9 F (36.6 C)-98.1 F (36.7 C)] 97.9 F (36.6 C) (05/07 0653) Pulse Rate:  [11-108] 108 (05/07 0653) Resp:  [20] 20 (05/07 0653) BP: (127-151)/(75-78) 151/75 mmHg (05/07 0653) SpO2:  [87 %-94 %] 87 % (05/07 0739) Weight change:  Last BM Date: 06/21/14  Intake/Output from previous day: 05/06 0701 - 05/07 0700 In: 480 [P.O.:480] Out: -   PHYSICAL EXAM General appearance: alert, cooperative and no distress Resp: rhonchi bilaterally Cardio: regular rate and rhythm, S1, S2 normal, no murmur, click, rub or gallop GI: soft, non-tender; bowel sounds normal; no masses,  no organomegaly Extremities: extremities normal, atraumatic, no cyanosis or edema  Lab Results:  Results for orders placed or performed during the hospital encounter of 06/13/14 (from the past 48 hour(s))  Basic metabolic panel     Status: Abnormal   Collection Time: 06/21/14  7:06 AM  Result Value Ref Range   Sodium 133 (L) 135 - 145 mmol/L   Potassium 4.7 3.5 - 5.1 mmol/L   Chloride 100 (L) 101 - 111 mmol/L   CO2 24 22 - 32 mmol/L   Glucose, Bld 168 (H) 70 - 99 mg/dL   BUN 57 (H) 6 - 20 mg/dL   Creatinine, Ser 1.75 (H) 0.61 - 1.24 mg/dL   Calcium 9.1 8.9 - 10.3 mg/dL   GFR calc non Af Amer 41 (L) >60 mL/min   GFR calc Af Amer 47 (L) >60 mL/min    Comment: (NOTE) The eGFR has been calculated using the CKD EPI equation. This calculation has not been validated in all clinical situations. eGFR's persistently <60 mL/min signify possible Chronic Kidney Disease.    Anion gap 9 5 - 15  Vancomycin, trough     Status: None   Collection Time: 06/22/14  8:32 AM  Result Value Ref Range   Vancomycin Tr 10 10.0 - 20.0 ug/mL  Basic metabolic panel     Status: Abnormal   Collection Time: 06/22/14  8:32 AM  Result Value Ref Range   Sodium 134 (L) 135 - 145 mmol/L    Potassium 4.2 3.5 - 5.1 mmol/L   Chloride 101 101 - 111 mmol/L   CO2 24 22 - 32 mmol/L   Glucose, Bld 214 (H) 70 - 99 mg/dL   BUN 59 (H) 6 - 20 mg/dL   Creatinine, Ser 1.73 (H) 0.61 - 1.24 mg/dL   Calcium 9.0 8.9 - 10.3 mg/dL   GFR calc non Af Amer 41 (L) >60 mL/min   GFR calc Af Amer 48 (L) >60 mL/min    Comment: (NOTE) The eGFR has been calculated using the CKD EPI equation. This calculation has not been validated in all clinical situations. eGFR's persistently <60 mL/min signify possible Chronic Kidney Disease.    Anion gap 9 5 - 15    ABGS No results for input(s): PHART, PO2ART, TCO2, HCO3 in the last 72 hours.  Invalid input(s): PCO2 CULTURES Recent Results (from the past 240 hour(s))  Blood culture (routine x 2)     Status: None   Collection Time: 06/13/14 10:42 PM  Result Value Ref Range Status   Specimen Description BLOOD LEFT ANTECUBITAL  Final   Special Requests BOTTLES DRAWN AEROBIC AND ANAEROBIC [place]  Final   Culture NO GROWTH 5 DAYS  Final   Report Status 06/18/2014 FINAL  Final  Blood  culture (routine x 2)     Status: None   Collection Time: 06/13/14 10:52 PM  Result Value Ref Range Status   Specimen Description BLOOD RIGHT ANTECUBITAL  Final   Special Requests   Final    BOTTLES DRAWN AEROBIC AND ANAEROBIC AEB=5CC ANA=6CC   Culture NO GROWTH 5 DAYS  Final   Report Status 06/18/2014 FINAL  Final  MRSA PCR Screening     Status: None   Collection Time: 06/14/14  1:00 AM  Result Value Ref Range Status   MRSA by PCR NEGATIVE NEGATIVE Final    Comment:        The GeneXpert MRSA Assay (FDA approved for NASAL specimens only), is one component of a comprehensive MRSA colonization surveillance program. It is not intended to diagnose MRSA infection nor to guide or monitor treatment for MRSA infections.   Culture, blood (routine x 2)     Status: None (Preliminary result)   Collection Time: 06/20/14  7:22 AM  Result Value Ref Range Status   Specimen  Description BLOOD LEFT ANTECUBITAL  Final   Special Requests BOTTLES DRAWN AEROBIC ONLY 5CC  Final   Culture NO GROWTH 1 DAY  Final   Report Status PENDING  Incomplete  Culture, blood (routine x 2)     Status: None (Preliminary result)   Collection Time: 06/20/14  8:53 AM  Result Value Ref Range Status   Specimen Description BLOOD LEFT ARM  Final   Special Requests   Final    BOTTLES DRAWN AEROBIC AND ANAEROBIC AEB=12CC ANA=8CC   Culture NO GROWTH 1 DAY  Final   Report Status PENDING  Incomplete   Studies/Results: No results found.  Medications:  Prior to Admission:  Prescriptions prior to admission  Medication Sig Dispense Refill Last Dose  . albuterol (PROVENTIL HFA;VENTOLIN HFA) 108 (90 BASE) MCG/ACT inhaler Inhale into the lungs every 6 (six) hours as needed for wheezing or shortness of breath.   06/13/2014 at Unknown time  . baclofen (LIORESAL) 10 MG tablet Take 1 tablet (10 mg total) by mouth 3 (three) times daily. As needed for muscle spasm 50 tablet 0 Past Week at Unknown time  . cloNIDine (CATAPRES) 0.1 MG tablet Take 1 tablet (0.1 mg total) by mouth 2 (two) times daily. 60 tablet 0 06/12/2014 at Unknown time  . dextromethorphan-guaiFENesin (MUCINEX DM) 30-600 MG per 12 hr tablet Take 1 tablet by mouth 2 (two) times daily.   06/13/2014 at Unknown time  . HYDROMET 5-1.5 MG/5ML syrup Take 5 mLs by mouth every 4 (four) hours as needed.  0 06/12/2014 at Unknown time  . LEVITRA 20 MG tablet Take 20 mg by mouth daily as needed. For intercourse  2 Past Month at Unknown time  . levofloxacin (LEVAQUIN) 500 MG tablet Take 500 mg by mouth daily.   06/13/2014 at Unknown time  . oxyCODONE-acetaminophen (PERCOCET/ROXICET) 5-325 MG per tablet Take 1 tablet by mouth every 4 (four) hours as needed for severe pain. 15 tablet 0 06/12/2014 at Unknown time  . sennosides-docusate sodium (SENOKOT-S) 8.6-50 MG tablet Take 2 tablets by mouth daily. 30 tablet 1 06/12/2014 at Unknown time  .  valsartan-hydrochlorothiazide (DIOVAN-HCT) 320-25 MG per tablet Take 1 tablet by mouth daily.   06/13/2014 at Unknown time  . ondansetron (ZOFRAN) 4 MG tablet Take 1 tablet (4 mg total) by mouth every 8 (eight) hours as needed for nausea or vomiting. (Patient not taking: Reported on 06/13/2014) 30 tablet 0 Taking   Scheduled: . albuterol  2.5 mg Nebulization TID  . ceFEPime (MAXIPIME) IV  2 g Intravenous Q24H  . cloNIDine  0.1 mg Oral BID  . enoxaparin (LOVENOX) injection  40 mg Subcutaneous Q24H  . furosemide  20 mg Intravenous Daily  . irbesartan  300 mg Oral Daily  . levofloxacin (LEVAQUIN) IV  750 mg Intravenous Q48H  . magic mouthwash  5 mL Oral QID  . methylPREDNISolone (SOLU-MEDROL) injection  40 mg Intravenous Q6H  . potassium chloride  20 mEq Oral Daily  . senna-docusate  2 tablet Oral Daily  . vancomycin  750 mg Intravenous Q24H   Continuous:  TMA:[id number], albuterol, labetalol, ondansetron **OR** ondansetron (ZOFRAN) IV  Assesment: He was admitted with community-acquired pneumonia. He has had hypoxic respiratory failure. He is much improved. He is hopeful of going home tomorrow. I think he has sleep apnea as well and will be set up for sleep study. Principal Problem:   CAP (community acquired pneumonia) Active Problems:   Hypoxia   Wheezing    Plan: I discussed his situation with his preferred DME supplier and they will be able to deliver nebulizer machine and oxygen tomorrow. I think we should switch him to oral steroids now.    LOS: 9 days   Kodi Guerrera L 06/22/2014, 9:48 AM

## 2014-06-22 NOTE — Progress Notes (Signed)
Patient's O2 sat 83% when ambulating this morning, on 6l [place]. Denied any dyspnea or CP when ambulating.

## 2014-06-22 NOTE — Progress Notes (Signed)
Subjective: The patient had a comfortable night.. His O2 sats have been in normal range and he is afebrile. He does have pulmonary infiltrates compatible with pneumonia  Objective: Vital signs in last 24 hours: Temp:  [97.7 F (36.5 C)-98.1 F (36.7 C)] 98.1 F (36.7 C) (05/06 2234) Pulse Rate:  [11-113] 104 (05/06 2234) Resp:  [20-21] 20 (05/06 2234) BP: (127-142)/(72-78) 127/78 mmHg (05/06 2234) SpO2:  [90 %-95 %] 94 % (05/06 2234) Weight change:  Last BM Date: 06/21/14  Intake/Output from previous day: 05/06 0701 - 05/07 0700 In: 480 [P.O.:480] Out: -  Intake/Output this shift:    Physical Exam: Gen. appearance-the patient appears alert and oriented  HEENT negative  Neck supple no JVD or thyroid abnormalities  Heart regular rhythm no murmurs  Lungs clear to P&A  Abdomen no palpable organs or masses  Extremities free of edema  No results for input(s): WBC, HGB, HCT, PLT in the last 72 hours. BMET  Recent Labs  06/20/14 0722 06/21/14 0706  NA 134* 133*  K 4.8 4.7  CL 97* 100*  CO2 29 24  GLUCOSE 105* 168*  BUN 44* 57*  CREATININE 1.92* 1.75*  CALCIUM 8.8* 9.1    Studies/Results: No results found.  Medications:  . albuterol  2.5 mg Nebulization TID  . ceFEPime (MAXIPIME) IV  2 [place] Intravenous Q24H  . cloNIDine  0.1 mg Oral BID  . enoxaparin (LOVENOX) injection  40 mg Subcutaneous Q24H  . furosemide  20 mg Intravenous Daily  . irbesartan  300 mg Oral Daily  . levofloxacin (LEVAQUIN) IV  750 mg Intravenous Q48H  . magic mouthwash  5 mL Oral QID  . methylPREDNISolone (SOLU-MEDROL) injection  40 mg Intravenous Q6H  . potassium chloride  20 mEq Oral Daily  . senna-docusate  2 tablet Oral Daily  . vancomycin  750 mg Intravenous Q24H        Assessment/Plan: 1. Community-acquired pneumonia in recovery phase-plan to continue IV vancomycin and IV Levaquin and Maxipime  2. Right hilar prominence rule out bronchogenic malignancy-to proceed with further  evaluation of this problem as an outpatient possible bronchoscopy  3. Elevated serum creatinine trending downward with low serum sodium as well plan to repeat chemistries today   LOS: 9 days   [name] 06/22/2014, 6:53 AM

## 2014-06-22 NOTE — Progress Notes (Signed)
[place] for Vancomycin, Levaquin, and Cefepime Indication: pneumonia  No Known Allergies  Patient Measurements: Height: '6\' 1"'$  (185.4 cm) Weight: 240 lb 4.8 oz (109 kg) IBW/kg (Calculated) : 79.9  Vital Signs: Temp: 97.9 F (36.6 C) (05/07 0653) Temp Source: Oral (05/07 0653) BP: 151/75 mmHg (05/07 0653) Pulse Rate: 108 (05/07 0653) Intake/Output from previous day: 05/06 0701 - 05/07 0700 In: 480 [P.O.:480] Out: -  Intake/Output from this shift:    Labs:  Recent Labs  06/20/14 0722 06/21/14 0706 06/22/14 0832  CREATININE 1.92* 1.75* 1.73*   Estimated Creatinine Clearance: 59.5 mL/min (by C-G formula based on Cr of 1.73).  Recent Labs  06/22/14 0832  [facility] 10     Microbiology: Recent Results (from the past 720 hour(s))  Blood culture (routine x 2)     Status: None   Collection Time: 06/13/14 10:42 PM  Result Value Ref Range Status   Specimen Description BLOOD LEFT ANTECUBITAL  Final   Special Requests BOTTLES DRAWN AEROBIC AND ANAEROBIC [place]  Final   Culture NO GROWTH 5 DAYS  Final   Report Status 06/18/2014 FINAL  Final  Blood culture (routine x 2)     Status: None   Collection Time: 06/13/14 10:52 PM  Result Value Ref Range Status   Specimen Description BLOOD RIGHT ANTECUBITAL  Final   Special Requests   Final    BOTTLES DRAWN AEROBIC AND ANAEROBIC AEB=5CC ANA=6CC   Culture NO GROWTH 5 DAYS  Final   Report Status 06/18/2014 FINAL  Final  MRSA PCR Screening     Status: None   Collection Time: 06/14/14  1:00 AM  Result Value Ref Range Status   MRSA by PCR NEGATIVE NEGATIVE Final    Comment:        The GeneXpert MRSA Assay (FDA approved for NASAL specimens only), is one component of a comprehensive MRSA colonization surveillance program. It is not intended to diagnose MRSA infection nor to guide or monitor treatment for MRSA infections.   Culture, blood (routine x 2)     Status: None (Preliminary  result)   Collection Time: 06/20/14  7:22 AM  Result Value Ref Range Status   Specimen Description BLOOD LEFT ANTECUBITAL  Final   Special Requests BOTTLES DRAWN AEROBIC ONLY 5CC  Final   Culture NO GROWTH 1 DAY  Final   Report Status PENDING  Incomplete  Culture, blood (routine x 2)     Status: None (Preliminary result)   Collection Time: 06/20/14  8:53 AM  Result Value Ref Range Status   Specimen Description BLOOD LEFT ARM  Final   Special Requests   Final    BOTTLES DRAWN AEROBIC AND ANAEROBIC AEB=12CC ANA=8CC   Culture NO GROWTH 1 DAY  Final   Report Status PENDING  Incomplete    Anti-infectives    Start     Dose/Rate Route Frequency Ordered Stop   06/22/14 1100  vancomycin (VANCOCIN) 1,250 mg in sodium chloride 0.9 % 250 mL IVPB     1,250 mg 166.7 mL/hr over 90 Minutes Intravenous Every 24 hours 06/22/14 1009     06/21/14 0800  levofloxacin (LEVAQUIN) IVPB 750 mg     750 mg 100 mL/hr over 90 Minutes Intravenous Every 48 hours 06/19/14 1116     06/20/14 1000  vancomycin (VANCOCIN) IVPB 750 mg/150 ml premix  Status:  Discontinued     750 mg 150 mL/hr over 60 Minutes Intravenous Every 24 hours 06/19/14 1114 06/22/14 1009  06/20/14 1000  ceFEPIme (MAXIPIME) 2 g in dextrose 5 % 50 mL IVPB     2 g 100 mL/hr over 30 Minutes Intravenous Every 24 hours 06/20/14 0807     06/16/14 2200  vancomycin (VANCOCIN) IVPB 750 mg/150 ml premix  Status:  Discontinued     750 mg 150 mL/hr over 60 Minutes Intravenous Every 12 hours 06/16/14 0956 06/19/14 1114   06/16/14 0945  vancomycin (VANCOCIN) 2,000 mg in sodium chloride 0.9 % 500 mL IVPB     2,000 mg 250 mL/hr over 120 Minutes Intravenous  Once 06/16/14 0934 06/16/14 1226   06/16/14 0845  levofloxacin (LEVAQUIN) IVPB 750 mg  Status:  Discontinued     750 mg 100 mL/hr over 90 Minutes Intravenous Every 24 hours 06/16/14 0840 06/19/14 1116   06/15/14 0100  cefTRIAXone (ROCEPHIN) 1 g in dextrose 5 % 50 mL IVPB - Premix  Status:  Discontinued      1 g 100 mL/hr over 30 Minutes Intravenous Every 24 hours 06/14/14 0042 06/14/14 1206   06/15/14 0100  cefTRIAXone (ROCEPHIN) 1 g in dextrose 5 % 50 mL IVPB  Status:  Discontinued     1 g 100 mL/hr over 30 Minutes Intravenous Every 24 hours 06/14/14 1207 06/16/14 0840   06/13/14 2300  cefTRIAXone (ROCEPHIN) 1 g in dextrose 5 % 50 mL IVPB     1 g 100 mL/hr over 30 Minutes Intravenous  Once 06/13/14 2246 06/14/14 0051   06/13/14 2300  azithromycin (ZITHROMAX) 500 mg in dextrose 5 % 250 mL IVPB  Status:  Discontinued     500 mg 250 mL/hr over 60 Minutes Intravenous Every 24 hours 06/13/14 2246 06/16/14 0840     Assessment: 59 yo obese M with PNA who continues on empiric, broad-spectrum antibiotics for PNA.  Coverage was broadened due to failure to improve and Cefepime eventually added.   Patient is clinically improving.   Renal function continues to improve.  Estimated Normalized ClCr ~ 45-50 ml/min.  Vancomycin trough below desired goal range today.   Zithromax 4/29 > 4/30 Ceftriaxone 4/29 > 5/1 Cefepime 5/5 >> Levaquin 5/1 >> Vancomycin 5/1 >>  Goal of Therapy:  Vancomycin trough level 15-20 mcg/ml  Eradicate infection.   Plan:   Increase Vancomycin '1250mg'$  IV q24hrs   Weekly Vancomycin level while on Vancomycin  Change Levaquin '750mg'$  PO [id number] (see P&T policy below  Change Cefepime 2gm IV q12hrs  Monitor labs (SCr), cultures and progress  Deescalate ABX when clinically appropriate  Duration of therapy per MD  Biagio Borg 06/22/2014,10:09 AM  PHARMACIST - PHYSICIAN COMMUNICATION CONCERNING: Antibiotic IV to Oral Route Change Policy  RECOMMENDATION: This patient is receiving Levaquin by the intravenous route.  Based on criteria approved by the Pharmacy and Therapeutics Committee, the antibiotic(s) is/are being converted to the equivalent oral dose form(s).   DESCRIPTION: These criteria include:  Patient being treated for a respiratory tract  infection, urinary tract infection, cellulitis or clostridium difficile associated diarrhea if on metronidazole  The patient is not neutropenic and does not exhibit a GI malabsorption state  The patient is eating (either orally or via tube) and/or has been taking other orally administered medications for a least 24 hours  The patient is improving clinically and has a Tmax < 100.5  If you have questions about this conversion, please contact the Pharmacy Department  '[x]'$   [phone] )  [facility] '[]'$   [phone] )  [facility] '[]'$   [phone] )  [facility] '[]'$   [phone] )  [facility] '[]'$   [phone] )  [facility]

## 2014-06-23 MED ORDER — POTASSIUM CHLORIDE CRYS ER 20 MEQ PO TBCR: 20.0000 meq | EXTENDED_RELEASE_TABLET | Freq: Every day | ORAL | Status: AC

## 2014-06-23 MED ORDER — IRBESARTAN 300 MG PO TABS: 300.0000 mg | ORAL_TABLET | Freq: Every day | ORAL | Status: DC

## 2014-06-23 NOTE — Progress Notes (Signed)
Subjective: He says he is going to be discharged today. He has no new complaints. His breathing is pretty good. He is still hypoxic when he ambulates. I have changed the order for his oxygen to 6 L and that can be adjusted.  Objective: Vital signs in last 24 hours: Temp:  [97.6 F (36.4 C)-98.5 F (36.9 C)] 98.5 F (36.9 C) (05/08 0651) Pulse Rate:  [103-105] 103 (05/08 0651) Resp:  [20] 20 (05/08 0651) BP: (122-132)/(72-80) 132/72 mmHg (05/08 0651) SpO2:  [88 %-96 %] 88 % (05/08 0806) Weight change:  Last BM Date: 06/22/14  Intake/Output from previous day: 05/07 0701 - 05/08 0700 In: 970 [P.O.:720; IV Piggyback:250] Out: -   PHYSICAL EXAM General appearance: alert, cooperative and no distress Resp: rhonchi bilaterally Cardio: regular rate and rhythm, S1, S2 normal, no murmur, click, rub or gallop GI: soft, non-tender; bowel sounds normal; no masses,  no organomegaly Extremities: extremities normal, atraumatic, no cyanosis or edema  Lab Results:  Results for orders placed or performed during the hospital encounter of 06/13/14 (from the past 48 hour(s))  Vancomycin, trough     Status: None   Collection Time: 06/22/14  8:32 AM  Result Value Ref Range   Vancomycin Tr 10 10.0 - 20.0 ug/mL  Basic metabolic panel     Status: Abnormal   Collection Time: 06/22/14  8:32 AM  Result Value Ref Range   Sodium 134 (L) 135 - 145 mmol/L   Potassium 4.2 3.5 - 5.1 mmol/L   Chloride 101 101 - 111 mmol/L   CO2 24 22 - 32 mmol/L   Glucose, Bld 214 (H) 70 - 99 mg/dL   BUN 59 (H) 6 - 20 mg/dL   Creatinine, Ser 1.73 (H) 0.61 - 1.24 mg/dL   Calcium 9.0 8.9 - 10.3 mg/dL   GFR calc non Af Amer 41 (L) >60 mL/min   GFR calc Af Amer 48 (L) >60 mL/min    Comment: (NOTE) The eGFR has been calculated using the CKD EPI equation. This calculation has not been validated in all clinical situations. eGFR's persistently <60 mL/min signify possible Chronic Kidney Disease.    Anion gap 9 5 - 15     ABGS No results for input(s): PHART, PO2ART, TCO2, HCO3 in the last 72 hours.  Invalid input(s): PCO2 CULTURES Recent Results (from the past 240 hour(s))  Blood culture (routine x 2)     Status: None   Collection Time: 06/13/14 10:42 PM  Result Value Ref Range Status   Specimen Description BLOOD LEFT ANTECUBITAL  Final   Special Requests BOTTLES DRAWN AEROBIC AND ANAEROBIC [facility]  Final   Culture NO GROWTH 5 DAYS  Final   Report Status 06/18/2014 FINAL  Final  Blood culture (routine x 2)     Status: None   Collection Time: 06/13/14 10:52 PM  Result Value Ref Range Status   Specimen Description BLOOD RIGHT ANTECUBITAL  Final   Special Requests   Final    BOTTLES DRAWN AEROBIC AND ANAEROBIC AEB=5CC ANA=6CC   Culture NO GROWTH 5 DAYS  Final   Report Status 06/18/2014 FINAL  Final  MRSA PCR Screening     Status: None   Collection Time: 06/14/14  1:00 AM  Result Value Ref Range Status   MRSA by PCR NEGATIVE NEGATIVE Final    Comment:        The GeneXpert MRSA Assay (FDA approved for NASAL specimens only), is one component of a comprehensive MRSA colonization surveillance program. It  is not intended to diagnose MRSA infection nor to guide or monitor treatment for MRSA infections.   Culture, blood (routine x 2)     Status: None (Preliminary result)   Collection Time: 06/20/14  7:22 AM  Result Value Ref Range Status   Specimen Description BLOOD LEFT ANTECUBITAL  Final   Special Requests BOTTLES DRAWN AEROBIC ONLY 5CC  Final   Culture NO GROWTH 2 DAYS  Final   Report Status PENDING  Incomplete  Culture, blood (routine x 2)     Status: None (Preliminary result)   Collection Time: 06/20/14  8:53 AM  Result Value Ref Range Status   Specimen Description BLOOD LEFT ARM  Final   Special Requests   Final    BOTTLES DRAWN AEROBIC AND ANAEROBIC AEB=12CC ANA=8CC   Culture NO GROWTH 2 DAYS  Final   Report Status PENDING  Incomplete   Studies/Results: No results  found.  Medications:  Prior to Admission:  Prescriptions prior to admission  Medication Sig Dispense Refill Last Dose  . albuterol (PROVENTIL HFA;VENTOLIN HFA) 108 (90 BASE) MCG/ACT inhaler Inhale into the lungs every 6 (six) hours as needed for wheezing or shortness of breath.   06/13/2014 at Unknown time  . baclofen (LIORESAL) 10 MG tablet Take 1 tablet (10 mg total) by mouth 3 (three) times daily. As needed for muscle spasm 50 tablet 0 Past Week at Unknown time  . cloNIDine (CATAPRES) 0.1 MG tablet Take 1 tablet (0.1 mg total) by mouth 2 (two) times daily. 60 tablet 0 06/12/2014 at Unknown time  . dextromethorphan-guaiFENesin (MUCINEX DM) 30-600 MG per 12 hr tablet Take 1 tablet by mouth 2 (two) times daily.   06/13/2014 at Unknown time  . HYDROMET 5-1.5 MG/5ML syrup Take 5 mLs by mouth every 4 (four) hours as needed.  0 06/12/2014 at Unknown time  . LEVITRA 20 MG tablet Take 20 mg by mouth daily as needed. For intercourse  2 Past Month at Unknown time  . levofloxacin (LEVAQUIN) 500 MG tablet Take 500 mg by mouth daily.   06/13/2014 at Unknown time  . oxyCODONE-acetaminophen (PERCOCET/ROXICET) 5-325 MG per tablet Take 1 tablet by mouth every 4 (four) hours as needed for severe pain. 15 tablet 0 06/12/2014 at Unknown time  . sennosides-docusate sodium (SENOKOT-S) 8.6-50 MG tablet Take 2 tablets by mouth daily. 30 tablet 1 06/12/2014 at Unknown time  . valsartan-hydrochlorothiazide (DIOVAN-HCT) 320-25 MG per tablet Take 1 tablet by mouth daily.   06/13/2014 at Unknown time  . ondansetron (ZOFRAN) 4 MG tablet Take 1 tablet (4 mg total) by mouth every 8 (eight) hours as needed for nausea or vomiting. (Patient not taking: Reported on 06/13/2014) 30 tablet 0 Taking   Scheduled: . albuterol  2.5 mg Nebulization TID  . ceFEPime (MAXIPIME) IV  2 g Intravenous Q12H  . cloNIDine  0.1 mg Oral BID  . enoxaparin (LOVENOX) injection  40 mg Subcutaneous Q24H  . furosemide  20 mg Intravenous Daily  . irbesartan   300 mg Oral Daily  . levofloxacin  750 mg Oral Daily  . magic mouthwash  5 mL Oral QID  . potassium chloride  20 mEq Oral Daily  . predniSONE  40 mg Oral Q breakfast  . senna-docusate  2 tablet Oral Daily  . vancomycin  1,250 mg Intravenous Q24H   Continuous:  [id number], albuterol, labetalol, ondansetron **OR** ondansetron (ZOFRAN) IV  Assesment: He was admitted with pneumonia. He has what may be some enlarged lymph nodes on his CT  of the chest and that will need further workup. Principal Problem:   CAP (community acquired pneumonia) Active Problems:   Hypoxia   Wheezing    Plan: Okay for discharge. I would send him home on a prednisone taper, Levaquin 500 mg daily for 5 more days and Ceftin 500 mg twice a day for 5 more days    LOS: 10 days   Ciena Sampley L 06/23/2014, 9:05 AM

## 2014-06-23 NOTE — Discharge Summary (Signed)
Physician Discharge Summary  [name] NID:[id number] DOB: Oct 24, 1955 DOA: 06/13/2014  PCP: Lanette Hampshire, MD  Admit date: 06/13/2014 Discharge date: 06/23/2014   Follow-up Information    Follow up with [facility] On 07/03/2014.   Why:  at 8 pm   Contact information:   [address] [place] [phone]      Discharge Diagnoses:  1. Community-acquired pneumonia 2. Possible early bronchogenic malignancy 3. Stomatitis monilia 4. Hypoxia 5. Acute renal failure 6. Probable sleep apnea 7.   Discharge Condition: Stable Disposition: Home  Diet recommendation: Regular  Filed Weights   06/13/14 2049 06/14/14 0500 06/15/14 0511  Weight: 111.131 kg (245 lb) 111.5 kg (245 lb 13 oz) 109 kg (240 lb 4.8 oz)    History of present illness:  The patient was admitted through the emergency room after having developed increasing dyspnea at home and cough. He was noted to have hypoxia on admission was started on nasal O2. CTPA showed no PEs but question right hilar nodular density suspicious for primary malignancy.  Hospital Course:  The patient was started on nasal O2. This had to be adjusted because of drops in O2 sats in the 80s. He also was started on IV antibiotics as well as nebulizer treatments. Antibiotics were IV Levaquin 750 mg vancomycin 750 mg every 24 hours and Maxipime 2 g every 24 hours. Patient was seen in consultation by Dr. Luan Pulling. He does have enlarged lymph nodes and right hilar prominence. CellCept bronchogenic malignancy has to be ruled out. It was decided that he would either need bronchoscopy or PET CT as outpatient. The patient slowly improved on this regimen. He didn't develop stomatitis which improved. Arrangements were made for him to have nasal O2 at home at 6 L and oxygen concentrator. Patient will be given appointment with pulmonology as follow-up. Patient was stable at the time of his discharge. He did have  what appeared to be mild pulmonary edema initially which improved with diuretic. He did have also elevated serum creatinine which did improve as well when furosemide was decreased.   Discharge Instructions The patient is to continue medications listed below. He will be continued on Levaquin 500 mg twice a day and will be given prednisone taper pack 12 day 10 mg    Medication List    TAKE these medications        albuterol 108 (90 BASE) MCG/ACT inhaler  Commonly known as:  PROVENTIL HFA;VENTOLIN HFA  Inhale into the lungs every 6 (six) hours as needed for wheezing or shortness of breath.     baclofen 10 MG tablet  Commonly known as:  LIORESAL  Take 1 tablet (10 mg total) by mouth 3 (three) times daily. As needed for muscle spasm     cloNIDine 0.1 MG tablet  Commonly known as:  CATAPRES  Take 1 tablet (0.1 mg total) by mouth 2 (two) times daily.     dextromethorphan-guaiFENesin 30-600 MG per 12 hr tablet  Commonly known as:  MUCINEX DM  Take 1 tablet by mouth 2 (two) times daily.     HYDROMET 5-1.5 MG/5ML syrup  Generic drug:  HYDROcodone-homatropine  Take 5 mLs by mouth every 4 (four) hours as needed.     irbesartan 300 MG tablet  Commonly known as:  AVAPRO  Take 1 tablet (300 mg total) by mouth daily.     LEVITRA 20 MG tablet  Generic drug:  vardenafil  Take 20 mg by mouth daily  as needed. For intercourse     levofloxacin 500 MG tablet  Commonly known as:  LEVAQUIN  Take 500 mg by mouth daily.     oxyCODONE-acetaminophen 5-325 MG per tablet  Commonly known as:  PERCOCET/ROXICET  Take 1 tablet by mouth every 4 (four) hours as needed for severe pain.     potassium chloride SA 20 MEQ tablet  Commonly known as:  K-DUR,KLOR-CON  Take 1 tablet (20 mEq total) by mouth daily.     sennosides-docusate sodium 8.6-50 MG tablet  Commonly known as:  SENOKOT-S  Take 2 tablets by mouth daily.     valsartan-hydrochlorothiazide 320-25 MG per tablet  Commonly known as:   DIOVAN-HCT  Take 1 tablet by mouth daily.      ASK your doctor about these medications        ondansetron 4 MG tablet  Commonly known as:  ZOFRAN  Take 1 tablet (4 mg total) by mouth every 8 (eight) hours as needed for nausea or vomiting.       No Known Allergies  The results of significant diagnostics from this hospitalization (including imaging, microbiology, ancillary and laboratory) are listed below for reference.    Significant Diagnostic Studies: Dg Chest 2 View  06/19/2014   CLINICAL DATA:  Shortness of breath with exertion, admitted with BILATERAL pneumonia, former smoker, hypertension  EXAM: CHEST  2 VIEW  COMPARISON:  06/13/2014  FINDINGS: Enlargement of cardiac silhouette.  Tortuous aorta.  Pulmonary vascularity normal.  Patchy infiltrates bilaterally primarily in the mid to lower lungs favoring pneumonia.  No definite pleural effusion or pneumothorax.  Osseous structures unremarkable.  IMPRESSION: Persistent pulmonary infiltrates in the mid to lower lungs consistent with pneumonia.  When compared to 06/13/2014, infiltrates demonstrate little interval change.   Electronically Signed   By: Lavonia Dana M.D.   On: 06/19/2014 08:39   Dg Chest 2 View  06/13/2014   CLINICAL DATA:  Central chest pain with weakness, chills and shortness of breath for 3 days. Right rotator cuff repair 2 months ago. Initial encounter.  EXAM: CHEST  2 VIEW  COMPARISON:  Radiographs 06/11/2014 and 03/11/2014.  FINDINGS: The heart size and mediastinal contours are stable. The heart size is at the upper limits of normal. There are persistent diffuse patchy airspace and interstitial opacities bilaterally, unchanged from the examination of 2 days ago. There is some fissural thickening without significant pleural fluid. The bones appear unchanged.  IMPRESSION: No significant change in bilateral airspace and interstitial opacities compared with recent prior studies. These findings could reflect atypical infection or  pulmonary edema.   Electronically Signed   By: Richardean Sale M.D.   On: 06/13/2014 21:48   Dg Chest 2 View  06/11/2014   CLINICAL DATA:  Cough. Chest tightness. Hypertension. Wheezing. Shortness of breath.  EXAM: CHEST  2 VIEW  COMPARISON:  03/11/2014  FINDINGS: Mildly motion degraded lateral view. Midline trachea. Borderline cardiomegaly, accentuated by low lung volumes. No pleural effusion or pneumothorax. Persistent low lung volumes. Patchy bibasilar airspace disease.  IMPRESSION: Bibasilar airspace disease, superimposed upon low lung volumes. Favored to represent infection. Followup PA and lateral chest X-ray is recommended in 3-4 weeks following trial of antibiotic therapy to ensure resolution and exclude underlying malignancy.  Mildly degraded lateral view.   Electronically Signed   By: Abigail Miyamoto M.D.   On: 06/11/2014 11:32   Ct Angio Chest Pe W/cm &/or Wo Cm  06/14/2014   CLINICAL DATA:  59 year old male with progressive shortness of  breath over the last few days  EXAM: CT ANGIOGRAPHY CHEST WITH CONTRAST  TECHNIQUE: Multidetector CT imaging of the chest was performed using the standard protocol during bolus administration of intravenous contrast. Multiplanar CT image reconstructions and MIPs were obtained to evaluate the vascular anatomy.  CONTRAST:  122m OMNIPAQUE IOHEXOL 350 MG/ML SOLN  COMPARISON:  Chest x-ray 06/13/2014 ; prior CT abdomen/ pelvis including lung bases 07/19/2013  FINDINGS: Mediastinum: Prominent right hilar lymphoid tissue. Index measurement of 23 x 17 mm in the right suprahilar station (image 34 series 4). In the right hilar station, the soft tissue density measures approximately 31 x 33 mm (image 45 series 4) numerous small nodules throughout the prevascular, AP window and paratracheal stations. Minimally prominent left infrahilar tissue measures 26 x 15 mm (image 50 series 4). Unremarkable thoracic esophagus.  Heart/Vascular: Adequate opacification of the pulmonary arteries  to the proximal segmental level. No evidence of acute pulmonary embolus. Cardiomegaly. Trace pericardial effusion. No aortic aneurysm.  Lungs/Pleura: Interlobular septal thickening and scattered ground-glass attenuation opacities consistent with mild pulmonary edema. Additionally, there are multifocal patchy airspace opacities throughout the right lung some of which have a nodular configuration concerning for a superimposed infectious/ inflammatory process.  Bones/Soft Tissues: No acute fracture or aggressive appearing lytic or blastic osseous lesion.  Upper Abdomen: Visualized upper abdominal organs are unremarkable.  Review of the MIP images confirms the above findings.  IMPRESSION: 1. Negative for acute pulmonary embolus. 2. Multifocal patchy airspace opacities many of which demonstrate a nodular configuration scattered in a peribronchovascular distribution throughout the right lung. Findings likely represent an acute infectious/inflammatory process such as multi lobar bronchopneumonia. However, there is prominence of the right hilar nodal soft tissues which is greater than expected for reactive adenopathy. An underlying primary bronchogenic malignancy is suspected. Consider further evaluation with bronchoscopy. Additionally, recommend short interval follow-up evaluation following an appropriate course of therapy. If the findings persist, PET-CT may become warranted. 3. Above changes are superimposed on a background of interlobular septal thickening and patchy ground-glass consistent with pulmonary edema and mild CHF. 4. Cardiomegaly.   Electronically Signed   By: HJacqulynn CadetM.D.   On: 06/14/2014 00:33    Microbiology: Recent Results (from the past 240 hour(s))  Blood culture (routine x 2)     Status: None   Collection Time: 06/13/14 10:42 PM  Result Value Ref Range Status   Specimen Description BLOOD LEFT ANTECUBITAL  Final   Special Requests BOTTLES DRAWN AEROBIC AND ANAEROBIC [place] Final    Culture NO GROWTH 5 DAYS  Final   Report Status 06/18/2014 FINAL  Final  Blood culture (routine x 2)     Status: None   Collection Time: 06/13/14 10:52 PM  Result Value Ref Range Status   Specimen Description BLOOD RIGHT ANTECUBITAL  Final   Special Requests   Final    BOTTLES DRAWN AEROBIC AND ANAEROBIC AEB=5CC ANA=6CC   Culture NO GROWTH 5 DAYS  Final   Report Status 06/18/2014 FINAL  Final  MRSA PCR Screening     Status: None   Collection Time: 06/14/14  1:00 AM  Result Value Ref Range Status   MRSA by PCR NEGATIVE NEGATIVE Final    Comment:        The GeneXpert MRSA Assay (FDA approved for NASAL specimens only), is one component of a comprehensive MRSA colonization surveillance program. It is not intended to diagnose MRSA infection nor to guide or monitor treatment for MRSA infections.   Culture,  blood (routine x 2)     Status: None (Preliminary result)   Collection Time: 06/20/14  7:22 AM  Result Value Ref Range Status   Specimen Description BLOOD LEFT ANTECUBITAL  Final   Special Requests BOTTLES DRAWN AEROBIC ONLY 5CC  Final   Culture NO GROWTH 2 DAYS  Final   Report Status PENDING  Incomplete  Culture, blood (routine x 2)     Status: None (Preliminary result)   Collection Time: 06/20/14  8:53 AM  Result Value Ref Range Status   Specimen Description BLOOD LEFT ARM  Final   Special Requests   Final    BOTTLES DRAWN AEROBIC AND ANAEROBIC AEB=12CC ANA=8CC   Culture NO GROWTH 2 DAYS  Final   Report Status PENDING  Incomplete     Labs: Basic Metabolic Panel:  Recent Labs Lab 06/17/14 0548 06/19/14 0645 06/20/14 0722 06/21/14 0706 06/22/14 0832  NA 138 133* 134* 133* 134*  K 4.0 4.2 4.8 4.7 4.2  CL 99* 97* 97* 100* 101  CO2 '31 27 29 24 24  '$ GLUCOSE 104* 110* 105* 168* 214*  BUN 26* 40* 44* 57* 59*  CREATININE 1.53* 2.04* 1.92* 1.75* 1.73*  CALCIUM 8.6* 8.6* 8.8* 9.1 9.0   Liver Function Tests:  Recent Labs Lab 06/17/14 0548  AST 26  ALT 14*  ALKPHOS  63  BILITOT 1.1  PROT 7.7  ALBUMIN 3.6   No results for input(s): LIPASE, AMYLASE in the last 168 hours. No results for input(s): AMMONIA in the last 168 hours. CBC:  Recent Labs Lab 06/17/14 0548 06/19/14 0645  WBC 5.1 6.0  NEUTROABS  --  4.4  HGB 13.8 14.1  HCT 44.7 44.2  MCV 94.5 92.7  PLT 188 214   Cardiac Enzymes: No results for input(s): CKTOTAL, CKMB, CKMBINDEX, TROPONINI in the last 168 hours. BNP: BNP (last 3 results)  Recent Labs  06/13/14 2124  BNP 82.0    ProBNP (last 3 results) No results for input(s): PROBNP in the last 8760 hours.  CBG: No results for input(s): GLUCAP in the last 168 hours.  Principal Problem:   CAP (community acquired pneumonia) Active Problems:   Hypoxia   Wheezing   Time coordinating discharge: 45 minutes Signed:  Marjean Donna, MD 06/23/2014, 10:19 AM

## 2014-06-23 NOTE — Plan of Care (Signed)
Problem: Consults Goal: Pneumonia Patient Education See Patient Educatio Module for education specifics.  Outcome: Completed/Met Date Met:  06/23/14 Provided handouts on pneumonia in exit care education. Discussed symptoms, medications, treatments, reasons to call MD or come back to hospital. Patient and spouse verbalized understanding.   Problem: Phase II Progression Outcomes Goal: Wean O2 if indicated Outcome: Adequate for Discharge O2 weaned as much as possible. Patient able to tolerate 4-5L [place] when at rest and requires up to 6L [place] with ambulation.  Problem: Phase III Progression Outcomes Goal: Convert IV antibiotics to PO Outcome: Completed/Met Date Met:  06/23/14 Patient received final dose of IV Vancomycin and IV Cefepime prior to discharge. Patient also received dose of PO Levaquin prior to discharge.

## 2014-06-23 NOTE — Progress Notes (Signed)
   06/23/14 0759  Mobility  Activity Ambulate in hall  Level of Assistance Independent  Assistive Device None  Distance Ambulated (ft) 300 ft  Ambulation Response Tolerated well  Patient able to ambulate in hall with steady pace, not needing to take breaks. O2 saturation dropped to 83% on 6L toward end of ambulation. Resting, patient was able to maintain O2 saturation of 92% on 5L.

## 2014-06-23 NOTE — Progress Notes (Signed)
NURSING PROGRESS NOTE  [name] [id number] Discharge Data: 06/23/2014 12:56 PM Attending Provider: Marjean Donna, MD [id number],DTOIZ Darnell Level, MD   [name] to be D/C'd Home per MD order.    All IV's discontinued and monitored for bleeding.  All belonging returned to patient for patient to take home.   AVS summary and prescriptions reviewed with patient and spouse.  Patient took home portable O2 tank brought by [employer].  Patient concerned that he does not have nebulizer solution ordered for home. Called Dr. Everette Rank, but stated that he does not need one today, and to follow up with Dr. Luan Pulling tomorrow. I will leave note at front desk for Dr. Luan Pulling to read tomorrow morning.  Patient left floor via wheelchair, escorted by NT.  Last Documented Vital Signs:  Blood pressure 132/72, pulse 114, temperature 98.5 F (36.9 C), temperature source Oral, resp. rate 24, height '6\' 1"'$  (1.854 m), weight 109 kg (240 lb 4.8 oz), SpO2 90 %.  [name] D

## 2014-06-24 ENCOUNTER — Encounter (HOSPITAL_COMMUNITY): Payer: 59 | Admitting: Occupational Therapy

## 2014-06-24 ENCOUNTER — Other Ambulatory Visit (HOSPITAL_COMMUNITY): Payer: Self-pay | Admitting: Respiratory Therapy

## 2014-06-24 DIAGNOSIS — G4730 Sleep apnea, unspecified: Secondary | ICD-10-CM

## 2014-06-24 NOTE — Progress Notes (Signed)
UR chart review completed.  

## 2014-06-25 LAB — CULTURE, BLOOD (ROUTINE X 2)
Culture: NO GROWTH
Culture: NO GROWTH

## 2014-06-26 ENCOUNTER — Other Ambulatory Visit (HOSPITAL_COMMUNITY): Payer: Self-pay | Admitting: Respiratory Therapy

## 2014-06-26 ENCOUNTER — Ambulatory Visit (HOSPITAL_COMMUNITY): Payer: 59 | Attending: Orthopedic Surgery

## 2014-06-26 DIAGNOSIS — R29898 Other symptoms and signs involving the musculoskeletal system: Secondary | ICD-10-CM

## 2014-06-26 DIAGNOSIS — M6289 Other specified disorders of muscle: Secondary | ICD-10-CM

## 2014-06-26 DIAGNOSIS — M25611 Stiffness of right shoulder, not elsewhere classified: Secondary | ICD-10-CM | POA: Insufficient documentation

## 2014-06-26 DIAGNOSIS — G4730 Sleep apnea, unspecified: Secondary | ICD-10-CM

## 2014-06-26 DIAGNOSIS — M25619 Stiffness of unspecified shoulder, not elsewhere classified: Secondary | ICD-10-CM

## 2014-06-26 DIAGNOSIS — I10 Essential (primary) hypertension: Secondary | ICD-10-CM | POA: Diagnosis not present

## 2014-06-26 DIAGNOSIS — Z4789 Encounter for other orthopedic aftercare: Secondary | ICD-10-CM | POA: Insufficient documentation

## 2014-06-26 DIAGNOSIS — M6281 Muscle weakness (generalized): Secondary | ICD-10-CM | POA: Insufficient documentation
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
## 2014-06-28 ENCOUNTER — Ambulatory Visit (HOSPITAL_COMMUNITY): Payer: 59 | Admitting: Occupational Therapy

## 2014-06-28 ENCOUNTER — Encounter (HOSPITAL_COMMUNITY): Payer: Self-pay | Admitting: Occupational Therapy

## 2014-06-28 DIAGNOSIS — Z4789 Encounter for other orthopedic aftercare: Secondary | ICD-10-CM | POA: Diagnosis not present

## 2014-06-28 DIAGNOSIS — M25619 Stiffness of unspecified shoulder, not elsewhere classified: Secondary | ICD-10-CM

## 2014-06-28 DIAGNOSIS — M25512 Pain in left shoulder: Secondary | ICD-10-CM

## 2014-06-28 DIAGNOSIS — M6289 Other specified disorders of muscle: Secondary | ICD-10-CM

## 2014-06-28 DIAGNOSIS — R29898 Other symptoms and signs involving the musculoskeletal system: Secondary | ICD-10-CM
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
## 2014-07-01 ENCOUNTER — Encounter (HOSPITAL_COMMUNITY): Payer: Self-pay | Admitting: Occupational Therapy

## 2014-07-01 ENCOUNTER — Ambulatory Visit (HOSPITAL_COMMUNITY): Payer: 59 | Admitting: Occupational Therapy

## 2014-07-01 DIAGNOSIS — R29898 Other symptoms and signs involving the musculoskeletal system: Secondary | ICD-10-CM

## 2014-07-01 DIAGNOSIS — M6289 Other specified disorders of muscle: Secondary | ICD-10-CM

## 2014-07-01 DIAGNOSIS — Z4789 Encounter for other orthopedic aftercare: Secondary | ICD-10-CM | POA: Diagnosis not present

## 2014-07-01 DIAGNOSIS — M25512 Pain in left shoulder: Secondary | ICD-10-CM

## 2014-07-01 DIAGNOSIS — M25619 Stiffness of unspecified shoulder, not elsewhere classified: Secondary | ICD-10-CM
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
## 2014-07-02 ENCOUNTER — Other Ambulatory Visit (HOSPITAL_COMMUNITY): Payer: Self-pay | Admitting: Pulmonary Disease

## 2014-07-02 DIAGNOSIS — R918 Other nonspecific abnormal finding of lung field: Secondary | ICD-10-CM

## 2014-07-02 DIAGNOSIS — J189 Pneumonia, unspecified organism: Secondary | ICD-10-CM

## 2014-07-03 ENCOUNTER — Ambulatory Visit (HOSPITAL_COMMUNITY): Payer: 59 | Admitting: Occupational Therapy

## 2014-07-03 ENCOUNTER — Ambulatory Visit: Payer: 59 | Attending: Pulmonary Disease | Admitting: Sleep Medicine

## 2014-07-03 DIAGNOSIS — R0683 Snoring: Secondary | ICD-10-CM | POA: Diagnosis present

## 2014-07-03 DIAGNOSIS — G4736 Sleep related hypoventilation in conditions classified elsewhere: Secondary | ICD-10-CM | POA: Diagnosis not present

## 2014-07-03 DIAGNOSIS — G4730 Sleep apnea, unspecified: Secondary | ICD-10-CM

## 2014-07-04 ENCOUNTER — Inpatient Hospital Stay (HOSPITAL_COMMUNITY)
Admission: EM | Admit: 2014-07-04 | Discharge: 2014-08-16 | DRG: 166 | Disposition: E | Payer: 59 | Attending: Pulmonary Disease | Admitting: Pulmonary Disease

## 2014-07-04 ENCOUNTER — Encounter (HOSPITAL_COMMUNITY): Payer: Self-pay | Admitting: Emergency Medicine

## 2014-07-04 ENCOUNTER — Emergency Department (HOSPITAL_COMMUNITY): Payer: 59

## 2014-07-04 DIAGNOSIS — N189 Chronic kidney disease, unspecified: Secondary | ICD-10-CM | POA: Diagnosis present

## 2014-07-04 DIAGNOSIS — N181 Chronic kidney disease, stage 1: Secondary | ICD-10-CM | POA: Diagnosis present

## 2014-07-04 DIAGNOSIS — I10 Essential (primary) hypertension: Secondary | ICD-10-CM | POA: Diagnosis present

## 2014-07-04 DIAGNOSIS — Z79899 Other long term (current) drug therapy: Secondary | ICD-10-CM

## 2014-07-04 DIAGNOSIS — R739 Hyperglycemia, unspecified: Secondary | ICD-10-CM | POA: Diagnosis present

## 2014-07-04 DIAGNOSIS — Z9981 Dependence on supplemental oxygen: Secondary | ICD-10-CM

## 2014-07-04 DIAGNOSIS — E872 Acidosis: Secondary | ICD-10-CM | POA: Diagnosis present

## 2014-07-04 DIAGNOSIS — J189 Pneumonia, unspecified organism: Secondary | ICD-10-CM | POA: Diagnosis present

## 2014-07-04 DIAGNOSIS — I472 Ventricular tachycardia: Secondary | ICD-10-CM | POA: Diagnosis not present

## 2014-07-04 DIAGNOSIS — I469 Cardiac arrest, cause unspecified: Secondary | ICD-10-CM | POA: Insufficient documentation

## 2014-07-04 DIAGNOSIS — J90 Pleural effusion, not elsewhere classified: Secondary | ICD-10-CM | POA: Diagnosis present

## 2014-07-04 DIAGNOSIS — Z515 Encounter for palliative care: Secondary | ICD-10-CM

## 2014-07-04 DIAGNOSIS — Z978 Presence of other specified devices: Secondary | ICD-10-CM

## 2014-07-04 DIAGNOSIS — Z87891 Personal history of nicotine dependence: Secondary | ICD-10-CM

## 2014-07-04 DIAGNOSIS — D696 Thrombocytopenia, unspecified: Secondary | ICD-10-CM | POA: Diagnosis not present

## 2014-07-04 DIAGNOSIS — R0902 Hypoxemia: Secondary | ICD-10-CM

## 2014-07-04 DIAGNOSIS — J80 Acute respiratory distress syndrome: Secondary | ICD-10-CM | POA: Diagnosis not present

## 2014-07-04 DIAGNOSIS — N179 Acute kidney failure, unspecified: Secondary | ICD-10-CM | POA: Diagnosis not present

## 2014-07-04 DIAGNOSIS — T380X5A Adverse effect of glucocorticoids and synthetic analogues, initial encounter: Secondary | ICD-10-CM | POA: Diagnosis present

## 2014-07-04 DIAGNOSIS — I129 Hypertensive chronic kidney disease with stage 1 through stage 4 chronic kidney disease, or unspecified chronic kidney disease: Secondary | ICD-10-CM | POA: Diagnosis present

## 2014-07-04 DIAGNOSIS — R570 Cardiogenic shock: Secondary | ICD-10-CM | POA: Diagnosis not present

## 2014-07-04 DIAGNOSIS — Z23 Encounter for immunization: Secondary | ICD-10-CM

## 2014-07-04 DIAGNOSIS — E440 Moderate protein-calorie malnutrition: Secondary | ICD-10-CM | POA: Diagnosis present

## 2014-07-04 DIAGNOSIS — J9601 Acute respiratory failure with hypoxia: Secondary | ICD-10-CM

## 2014-07-04 DIAGNOSIS — R0602 Shortness of breath: Secondary | ICD-10-CM | POA: Diagnosis present

## 2014-07-04 DIAGNOSIS — J8410 Pulmonary fibrosis, unspecified: Secondary | ICD-10-CM | POA: Diagnosis present

## 2014-07-04 DIAGNOSIS — Z902 Acquired absence of lung [part of]: Secondary | ICD-10-CM

## 2014-07-04 DIAGNOSIS — R600 Localized edema: Secondary | ICD-10-CM | POA: Diagnosis present

## 2014-07-04 DIAGNOSIS — E871 Hypo-osmolality and hyponatremia: Secondary | ICD-10-CM | POA: Diagnosis present

## 2014-07-04 DIAGNOSIS — R634 Abnormal weight loss: Secondary | ICD-10-CM | POA: Diagnosis present

## 2014-07-04 DIAGNOSIS — J9690 Respiratory failure, unspecified, unspecified whether with hypoxia or hypercapnia: Secondary | ICD-10-CM

## 2014-07-04 DIAGNOSIS — Z66 Do not resuscitate: Secondary | ICD-10-CM | POA: Diagnosis not present

## 2014-07-04 DIAGNOSIS — B370 Candidal stomatitis: Secondary | ICD-10-CM | POA: Diagnosis present

## 2014-07-04 DIAGNOSIS — J9600 Acute respiratory failure, unspecified whether with hypoxia or hypercapnia: Secondary | ICD-10-CM | POA: Diagnosis present

## 2014-07-04 DIAGNOSIS — R918 Other nonspecific abnormal finding of lung field: Secondary | ICD-10-CM | POA: Diagnosis present

## 2014-07-04 DIAGNOSIS — E875 Hyperkalemia: Secondary | ICD-10-CM | POA: Diagnosis not present

## 2014-07-04 DIAGNOSIS — G931 Anoxic brain damage, not elsewhere classified: Secondary | ICD-10-CM | POA: Diagnosis not present

## 2014-07-04 DIAGNOSIS — Y95 Nosocomial condition: Secondary | ICD-10-CM | POA: Diagnosis present

## 2014-07-04 LAB — BASIC METABOLIC PANEL
Anion gap: 7 (ref 5–15)
BUN: 31 mg/dL — ABNORMAL HIGH (ref 6–20)
CO2: 25 mmol/L (ref 22–32)
CREATININE: 1.25 mg/dL — AB (ref 0.61–1.24)
Calcium: 8.1 mg/dL — ABNORMAL LOW (ref 8.9–10.3)
Chloride: 102 mmol/L (ref 101–111)
Glucose, Bld: 141 mg/dL — ABNORMAL HIGH (ref 65–99)
Potassium: 3.9 mmol/L (ref 3.5–5.1)
Sodium: 134 mmol/L — ABNORMAL LOW (ref 135–145)

## 2014-07-04 LAB — CBC WITH DIFFERENTIAL/PLATELET
Basophils Absolute: 0 10*3/uL (ref 0.0–0.1)
Basophils Relative: 0 % (ref 0–1)
EOS ABS: 0.1 10*3/uL (ref 0.0–0.7)
Eosinophils Relative: 1 % (ref 0–5)
HCT: 39.7 % (ref 39.0–52.0)
HEMOGLOBIN: 12.7 g/dL — AB (ref 13.0–17.0)
Lymphocytes Relative: 9 % — ABNORMAL LOW (ref 12–46)
Lymphs Abs: 1 10*3/uL (ref 0.7–4.0)
MCH: 28.9 pg (ref 26.0–34.0)
MCHC: 32 g/dL (ref 30.0–36.0)
MCV: 90.4 fL (ref 78.0–100.0)
Monocytes Absolute: 0.6 10*3/uL (ref 0.1–1.0)
Monocytes Relative: 5 % (ref 3–12)
Neutro Abs: 8.9 10*3/uL — ABNORMAL HIGH (ref 1.7–7.7)
Neutrophils Relative %: 85 % — ABNORMAL HIGH (ref 43–77)
PLATELETS: 215 10*3/uL (ref 150–400)
RBC: 4.39 MIL/uL (ref 4.22–5.81)
RDW: 13.6 % (ref 11.5–15.5)
WBC: 10.4 10*3/uL (ref 4.0–10.5)

## 2014-07-04 LAB — I-STAT TROPONIN, ED: TROPONIN I, POC: 0.01 ng/mL (ref 0.00–0.08)

## 2014-07-04 LAB — BRAIN NATRIURETIC PEPTIDE: B Natriuretic Peptide: 2085 pg/mL — ABNORMAL HIGH (ref 0.0–100.0)

## 2014-07-04 LAB — I-STAT CG4 LACTIC ACID, ED: Lactic Acid, Venous: 1.14 mmol/L (ref 0.5–2.0)

## 2014-07-04 MED ORDER — VANCOMYCIN HCL 10 G IV SOLR
2000.0000 mg | Freq: Once | INTRAVENOUS | Status: DC
  Filled 2014-07-04: qty 2000

## 2014-07-04 MED ORDER — SODIUM CHLORIDE 0.9 % IV BOLUS (SEPSIS)
1000.0000 mL | INTRAVENOUS | Status: DC
  Administered 2014-07-05: 1000 mL via INTRAVENOUS

## 2014-07-04 MED ORDER — SODIUM CHLORIDE 0.9 % IV BOLUS (SEPSIS): 500.0000 mL | INTRAVENOUS | Status: DC

## 2014-07-04 MED ORDER — DEXTROSE 5 % IV SOLN
1.0000 g | Freq: Once | INTRAVENOUS | Status: AC
  Administered 2014-07-05: 1 g via INTRAVENOUS
  Filled 2014-07-04: qty 1

## 2014-07-04 MED ORDER — IPRATROPIUM-ALBUTEROL 0.5-2.5 (3) MG/3ML IN SOLN
3.0000 mL | Freq: Once | RESPIRATORY_TRACT | Status: AC
  Administered 2014-07-04: 3 mL via RESPIRATORY_TRACT
  Filled 2014-07-04: qty 3

## 2014-07-04 NOTE — Progress Notes (Signed)
ANTIBIOTIC CONSULT NOTE-Preliminary  Pharmacy Consult for Vancomycin Indication: Pneumonia  No Known Allergies  Patient Measurements: Height: '6\' 1"'$  (185.4 cm) Weight: 230 lb (104.327 kg) IBW/kg (Calculated) : 79.9   Vital Signs: Temp: 99.1 F (37.3 C) (05/19 2056) Temp Source: Oral (05/19 2056) BP: 105/76 mmHg (05/19 2236) Pulse Rate: 110 (05/19 2236)  Labs:  Recent Labs  06/23/2014 2230  WBC 10.4  HGB 12.7*  PLT 215  CREATININE 1.25*    Estimated Creatinine Clearance: 80.7 mL/min (by C-G formula based on Cr of 1.25).  No results for input(s): VANCOTROUGH, VANCOPEAK, VANCORANDOM, GENTTROUGH, GENTPEAK, GENTRANDOM, TOBRATROUGH, TOBRAPEAK, TOBRARND, AMIKACINPEAK, AMIKACINTROU, AMIKACIN in the last 72 hours.   Microbiology: Recent Results (from the past 720 hour(s))  Blood culture (routine x 2)     Status: None   Collection Time: 06/13/14 10:42 PM  Result Value Ref Range Status   Specimen Description BLOOD LEFT ANTECUBITAL  Final   Special Requests BOTTLES DRAWN AEROBIC AND ANAEROBIC [place]  Final   Culture NO GROWTH 5 DAYS  Final   Report Status 06/18/2014 FINAL  Final  Blood culture (routine x 2)     Status: None   Collection Time: 06/13/14 10:52 PM  Result Value Ref Range Status   Specimen Description BLOOD RIGHT ANTECUBITAL  Final   Special Requests   Final    BOTTLES DRAWN AEROBIC AND ANAEROBIC AEB=5CC ANA=6CC   Culture NO GROWTH 5 DAYS  Final   Report Status 06/18/2014 FINAL  Final  MRSA PCR Screening     Status: None   Collection Time: 06/14/14  1:00 AM  Result Value Ref Range Status   MRSA by PCR NEGATIVE NEGATIVE Final    Comment:        The GeneXpert MRSA Assay (FDA approved for NASAL specimens only), is one component of a comprehensive MRSA colonization surveillance program. It is not intended to diagnose MRSA infection nor to guide or monitor treatment for MRSA infections.   Culture, blood (routine x 2)     Status: None   Collection Time:  06/20/14  7:22 AM  Result Value Ref Range Status   Specimen Description BLOOD LEFT ANTECUBITAL  Final   Special Requests BOTTLES DRAWN AEROBIC ONLY 5CC  Final   Culture NO GROWTH 5 DAYS  Final   Report Status 06/25/2014 FINAL  Final  Culture, blood (routine x 2)     Status: None   Collection Time: 06/20/14  8:53 AM  Result Value Ref Range Status   Specimen Description BLOOD LEFT ARM  Final   Special Requests   Final    BOTTLES DRAWN AEROBIC AND ANAEROBIC AEB=12CC ANA=8CC   Culture NO GROWTH 5 DAYS  Final   Report Status 06/25/2014 FINAL  Final    Medical History: Past Medical History  Diagnosis Date  . Hypertension   . 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 Traumatic tear of right rotator cuff 04/19/2014    Medications:  Cefepime 1 Gm IV given in the ED on 06/27/2014 at 2300  Assessment: 59 yo male recently treated for pneumonia; s/p hospital discharge 06/23/14; now with fever and increased SOB. Chest x-ray shows infiltrates/opacities; Empiric antibiotics for HCAP.  Goal of Therapy:  Vancomycin troughs 15-20 mcg/ml  Plan:  Preliminary review of pertinent patient information completed.  Protocol will be initiated with a one-time dose of Vancomycin 2 Gm IV loading dose.  Forestine Na clinical pharmacist will complete review during morning rounds to assess patient and finalize treatment regimen.  Norberto Sorenson, [facility] 07/07/2014,11:27 PM

## 2014-07-04 NOTE — ED Notes (Signed)
Wife states patient took tylenol at 59.

## 2014-07-04 NOTE — ED Notes (Signed)
Patient's oxygen increased to 6 liters via nasal canula, oxygen up to 91%
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
## 2014-07-04 NOTE — ED Notes (Signed)
Patient recently treated for pneumonia and discharged from the hospital on the 8th. States started running a fever tonight. Wife states oxygen started running in the 4s tongith. Patient complaining of shortness of breath at rest. Oxygen saturation 83% on 4 liters via nasal canula at triage.

## 2014-07-05 ENCOUNTER — Telehealth (HOSPITAL_COMMUNITY): Payer: Self-pay

## 2014-07-05 ENCOUNTER — Telehealth: Payer: Self-pay | Admitting: Adult Health

## 2014-07-05 ENCOUNTER — Inpatient Hospital Stay (HOSPITAL_COMMUNITY): Payer: 59

## 2014-07-05 ENCOUNTER — Encounter (HOSPITAL_COMMUNITY): Payer: Self-pay | Admitting: Emergency Medicine

## 2014-07-05 ENCOUNTER — Ambulatory Visit (HOSPITAL_COMMUNITY): Payer: 59 | Admitting: Specialist

## 2014-07-05 DIAGNOSIS — J189 Pneumonia, unspecified organism: Secondary | ICD-10-CM | POA: Insufficient documentation

## 2014-07-05 DIAGNOSIS — J80 Acute respiratory distress syndrome: Secondary | ICD-10-CM | POA: Diagnosis present

## 2014-07-05 DIAGNOSIS — I469 Cardiac arrest, cause unspecified: Secondary | ICD-10-CM | POA: Diagnosis not present

## 2014-07-05 DIAGNOSIS — E871 Hypo-osmolality and hyponatremia: Secondary | ICD-10-CM | POA: Diagnosis present

## 2014-07-05 DIAGNOSIS — I10 Essential (primary) hypertension: Secondary | ICD-10-CM | POA: Diagnosis not present

## 2014-07-05 DIAGNOSIS — J8410 Pulmonary fibrosis, unspecified: Secondary | ICD-10-CM | POA: Diagnosis present

## 2014-07-05 DIAGNOSIS — N181 Chronic kidney disease, stage 1: Secondary | ICD-10-CM | POA: Diagnosis present

## 2014-07-05 DIAGNOSIS — R739 Hyperglycemia, unspecified: Secondary | ICD-10-CM | POA: Diagnosis present

## 2014-07-05 DIAGNOSIS — J9601 Acute respiratory failure with hypoxia: Secondary | ICD-10-CM | POA: Diagnosis not present

## 2014-07-05 DIAGNOSIS — R634 Abnormal weight loss: Secondary | ICD-10-CM | POA: Diagnosis present

## 2014-07-05 DIAGNOSIS — J90 Pleural effusion, not elsewhere classified: Secondary | ICD-10-CM | POA: Diagnosis present

## 2014-07-05 DIAGNOSIS — J9600 Acute respiratory failure, unspecified whether with hypoxia or hypercapnia: Secondary | ICD-10-CM | POA: Diagnosis present

## 2014-07-05 DIAGNOSIS — R918 Other nonspecific abnormal finding of lung field: Secondary | ICD-10-CM | POA: Diagnosis not present

## 2014-07-05 DIAGNOSIS — Z66 Do not resuscitate: Secondary | ICD-10-CM | POA: Diagnosis not present

## 2014-07-05 DIAGNOSIS — B370 Candidal stomatitis: Secondary | ICD-10-CM | POA: Diagnosis present

## 2014-07-05 DIAGNOSIS — I129 Hypertensive chronic kidney disease with stage 1 through stage 4 chronic kidney disease, or unspecified chronic kidney disease: Secondary | ICD-10-CM | POA: Diagnosis present

## 2014-07-05 DIAGNOSIS — N179 Acute kidney failure, unspecified: Secondary | ICD-10-CM | POA: Diagnosis not present

## 2014-07-05 DIAGNOSIS — R0602 Shortness of breath: Secondary | ICD-10-CM | POA: Diagnosis not present

## 2014-07-05 DIAGNOSIS — Z79899 Other long term (current) drug therapy: Secondary | ICD-10-CM | POA: Diagnosis not present

## 2014-07-05 DIAGNOSIS — R570 Cardiogenic shock: Secondary | ICD-10-CM | POA: Diagnosis not present

## 2014-07-05 DIAGNOSIS — R0902 Hypoxemia: Secondary | ICD-10-CM

## 2014-07-05 DIAGNOSIS — I472 Ventricular tachycardia: Secondary | ICD-10-CM | POA: Diagnosis not present

## 2014-07-05 DIAGNOSIS — E875 Hyperkalemia: Secondary | ICD-10-CM | POA: Diagnosis not present

## 2014-07-05 DIAGNOSIS — Z515 Encounter for palliative care: Secondary | ICD-10-CM | POA: Diagnosis not present

## 2014-07-05 DIAGNOSIS — E872 Acidosis: Secondary | ICD-10-CM | POA: Diagnosis present

## 2014-07-05 DIAGNOSIS — Z23 Encounter for immunization: Secondary | ICD-10-CM | POA: Diagnosis not present

## 2014-07-05 DIAGNOSIS — G931 Anoxic brain damage, not elsewhere classified: Secondary | ICD-10-CM | POA: Diagnosis not present

## 2014-07-05 DIAGNOSIS — R600 Localized edema: Secondary | ICD-10-CM | POA: Diagnosis present

## 2014-07-05 DIAGNOSIS — D696 Thrombocytopenia, unspecified: Secondary | ICD-10-CM | POA: Diagnosis not present

## 2014-07-05 DIAGNOSIS — Y95 Nosocomial condition: Secondary | ICD-10-CM | POA: Diagnosis present

## 2014-07-05 DIAGNOSIS — T380X5A Adverse effect of glucocorticoids and synthetic analogues, initial encounter: Secondary | ICD-10-CM | POA: Diagnosis present

## 2014-07-05 DIAGNOSIS — Z87891 Personal history of nicotine dependence: Secondary | ICD-10-CM | POA: Diagnosis not present

## 2014-07-05 DIAGNOSIS — Z9981 Dependence on supplemental oxygen: Secondary | ICD-10-CM | POA: Diagnosis not present

## 2014-07-05 DIAGNOSIS — N189 Chronic kidney disease, unspecified: Secondary | ICD-10-CM | POA: Diagnosis not present

## 2014-07-05 LAB — CBC WITH DIFFERENTIAL/PLATELET
BASOS ABS: 0 10*3/uL (ref 0.0–0.1)
Basophils Relative: 0 % (ref 0–1)
EOS ABS: 0 10*3/uL (ref 0.0–0.7)
EOS PCT: 0 % (ref 0–5)
HEMATOCRIT: 41 % (ref 39.0–52.0)
Hemoglobin: 12.9 g/dL — ABNORMAL LOW (ref 13.0–17.0)
Lymphocytes Relative: 13 % (ref 12–46)
Lymphs Abs: 1.1 10*3/uL (ref 0.7–4.0)
MCH: 28.7 pg (ref 26.0–34.0)
MCHC: 31.5 g/dL (ref 30.0–36.0)
MCV: 91.3 fL (ref 78.0–100.0)
Monocytes Absolute: 0.5 10*3/uL (ref 0.1–1.0)
Monocytes Relative: 6 % (ref 3–12)
Neutro Abs: 6.5 10*3/uL (ref 1.7–7.7)
Neutrophils Relative %: 81 % — ABNORMAL HIGH (ref 43–77)
PLATELETS: 208 10*3/uL (ref 150–400)
RBC: 4.49 MIL/uL (ref 4.22–5.81)
RDW: 13.8 % (ref 11.5–15.5)
WBC: 8.1 10*3/uL (ref 4.0–10.5)

## 2014-07-05 LAB — URINALYSIS, ROUTINE W REFLEX MICROSCOPIC
Bilirubin Urine: NEGATIVE
GLUCOSE, UA: NEGATIVE mg/dL
Hgb urine dipstick: NEGATIVE
Ketones, ur: NEGATIVE mg/dL
LEUKOCYTES UA: NEGATIVE
Nitrite: NEGATIVE
PROTEIN: NEGATIVE mg/dL
UROBILINOGEN UA: 0.2 mg/dL (ref 0.0–1.0)
pH: 5.5 (ref 5.0–8.0)

## 2014-07-05 LAB — BLOOD GAS, ARTERIAL
Acid-Base Excess: 5.7 mmol/L — ABNORMAL HIGH (ref 0.0–2.0)
Bicarbonate: 29.8 mEq/L — ABNORMAL HIGH (ref 20.0–24.0)
DRAWN BY: 22223
FIO2: 100 %
O2 Content: 15 L/min
O2 Saturation: 94.4 %
Patient temperature: 37
TCO2: 26 mmol/L (ref 0–100)
pCO2 arterial: 44.5 mmHg (ref 35.0–45.0)
pH, Arterial: 7.442 (ref 7.350–7.450)
pO2, Arterial: 78.9 mmHg — ABNORMAL LOW (ref 80.0–100.0)

## 2014-07-05 LAB — BASIC METABOLIC PANEL
ANION GAP: 6 (ref 5–15)
BUN: 26 mg/dL — AB (ref 6–20)
CALCIUM: 8.4 mg/dL — AB (ref 8.9–10.3)
CO2: 28 mmol/L (ref 22–32)
Chloride: 102 mmol/L (ref 101–111)
Creatinine, Ser: 1.18 mg/dL (ref 0.61–1.24)
GFR calc Af Amer: >60 mL/min (ref >60)
GFR calc non Af Amer: >60 mL/min (ref >60)
GLUCOSE: 183 mg/dL — AB (ref 65–99)
POTASSIUM: 4.4 mmol/L (ref 3.5–5.1)
Sodium: 136 mmol/L (ref 135–145)

## 2014-07-05 LAB — STREP PNEUMONIAE URINARY ANTIGEN: Strep Pneumo Urinary Antigen: NEGATIVE

## 2014-07-05 LAB — MRSA PCR SCREENING: MRSA BY PCR: NEGATIVE

## 2014-07-05 LAB — GLUCOSE, CAPILLARY: Glucose-Capillary: 164 mg/dL — ABNORMAL HIGH (ref 65–99)

## 2014-07-05 LAB — PROCALCITONIN: Procalcitonin: <0.1 ng/mL

## 2014-07-05 MED ORDER — HYDROCHLOROTHIAZIDE 25 MG PO TABS
25.0000 mg | ORAL_TABLET | Freq: Every day | ORAL | Status: DC
  Administered 2014-07-05: 25 mg via ORAL
  Filled 2014-07-05: qty 1

## 2014-07-05 MED ORDER — SODIUM CHLORIDE 0.9 % IJ SOLN
3.0000 mL | Freq: Two times a day (BID) | INTRAMUSCULAR | Status: DC
  Administered 2014-07-05 – 2014-07-06 (×3): 3 mL via INTRAVENOUS

## 2014-07-05 MED ORDER — CLONIDINE HCL 0.1 MG PO TABS
0.1000 mg | ORAL_TABLET | Freq: Two times a day (BID) | ORAL | Status: DC
  Administered 2014-07-05 – 2014-07-13 (×16): 0.1 mg via ORAL
  Filled 2014-07-05 (×21): qty 1

## 2014-07-05 MED ORDER — SODIUM CHLORIDE 0.9 % IV SOLN
1250.0000 mg | Freq: Two times a day (BID) | INTRAVENOUS | Status: DC
  Administered 2014-07-06: 1250 mg via INTRAVENOUS
  Filled 2014-07-05 (×7): qty 1250

## 2014-07-05 MED ORDER — DM-GUAIFENESIN ER 30-600 MG PO TB12
1.0000 | ORAL_TABLET | Freq: Two times a day (BID) | ORAL | Status: DC
  Administered 2014-07-05 – 2014-07-06 (×4): 1 via ORAL
  Filled 2014-07-05 (×5): qty 1

## 2014-07-05 MED ORDER — FUROSEMIDE 10 MG/ML IJ SOLN
40.0000 mg | Freq: Once | INTRAMUSCULAR | Status: DC
Start: 2014-07-05 — End: 2014-07-06

## 2014-07-05 MED ORDER — MAGIC MOUTHWASH
5.0000 mL | Freq: Three times a day (TID) | ORAL | Status: DC
  Administered 2014-07-05 – 2014-07-17 (×38): 5 mL via ORAL
  Filled 2014-07-05 (×58): qty 5

## 2014-07-05 MED ORDER — SODIUM CHLORIDE 0.9 % IV SOLN
250.0000 mL | INTRAVENOUS | Status: DC | PRN
  Administered 2014-07-05: 10 mL/h via INTRAVENOUS

## 2014-07-05 MED ORDER — OXYCODONE-ACETAMINOPHEN 5-325 MG PO TABS
1.0000 | ORAL_TABLET | ORAL | Status: DC | PRN
  Administered 2014-07-05 (×2): 1 via ORAL
  Filled 2014-07-05 (×2): qty 1

## 2014-07-05 MED ORDER — IOHEXOL 300 MG/ML  SOLN
80.0000 mL | Freq: Once | INTRAMUSCULAR | Status: AC | PRN
  Administered 2014-07-05: 80 mL via INTRAVENOUS

## 2014-07-05 MED ORDER — SODIUM CHLORIDE 0.9 % IJ SOLN: 3.0000 mL | INTRAMUSCULAR | Status: DC | PRN

## 2014-07-05 MED ORDER — FUROSEMIDE 10 MG/ML IJ SOLN
20.0000 mg | Freq: Every day | INTRAMUSCULAR | Status: DC
  Administered 2014-07-05: 20 mg via INTRAVENOUS
  Filled 2014-07-05 (×2): qty 2

## 2014-07-05 MED ORDER — METOPROLOL TARTRATE 1 MG/ML IV SOLN
5.0000 mg | INTRAVENOUS | Status: DC | PRN
  Administered 2014-07-05: 5 mg via INTRAVENOUS
  Filled 2014-07-05: qty 5

## 2014-07-05 MED ORDER — VANCOMYCIN HCL IN DEXTROSE 1-5 GM/200ML-% IV SOLN
INTRAVENOUS | Status: AC
  Filled 2014-07-05: qty 400

## 2014-07-05 MED ORDER — DEXTROSE 5 % IV SOLN
INTRAVENOUS | Status: AC
  Filled 2014-07-05: qty 1

## 2014-07-05 MED ORDER — DEXTROSE 5 % IV SOLN
1.0000 g | Freq: Three times a day (TID) | INTRAVENOUS | Status: DC
  Administered 2014-07-05 – 2014-07-06 (×4): 1 g via INTRAVENOUS
  Filled 2014-07-05 (×13): qty 1

## 2014-07-05 MED ORDER — VALSARTAN-HYDROCHLOROTHIAZIDE 320-25 MG PO TABS: 1.0000 | ORAL_TABLET | Freq: Every day | ORAL | Status: DC

## 2014-07-05 MED ORDER — CETYLPYRIDINIUM CHLORIDE 0.05 % MT LIQD
7.0000 mL | Freq: Two times a day (BID) | OROMUCOSAL | Status: DC
  Administered 2014-07-05 – 2014-07-11 (×8): 7 mL via OROMUCOSAL

## 2014-07-05 MED ORDER — IPRATROPIUM-ALBUTEROL 0.5-2.5 (3) MG/3ML IN SOLN: 3.0000 mL | RESPIRATORY_TRACT | Status: DC | PRN

## 2014-07-05 MED ORDER — INSULIN ASPART 100 UNIT/ML ~~LOC~~ SOLN
2.0000 [IU] | SUBCUTANEOUS | Status: DC
  Administered 2014-07-05: 6 [IU] via SUBCUTANEOUS
  Administered 2014-07-06: 2 [IU] via SUBCUTANEOUS

## 2014-07-05 MED ORDER — PNEUMOCOCCAL VAC POLYVALENT 25 MCG/0.5ML IJ INJ
0.5000 mL | INJECTION | INTRAMUSCULAR | Status: AC
  Administered 2014-07-06: 0.5 mL via INTRAMUSCULAR
  Filled 2014-07-05 (×2): qty 0.5

## 2014-07-05 MED ORDER — LEVOFLOXACIN IN D5W 500 MG/100ML IV SOLN
500.0000 mg | INTRAVENOUS | Status: DC
  Administered 2014-07-05 – 2014-07-06 (×2): 500 mg via INTRAVENOUS
  Filled 2014-07-05 (×2): qty 100

## 2014-07-05 MED ORDER — ONDANSETRON HCL 4 MG/2ML IJ SOLN: 4.0000 mg | Freq: Three times a day (TID) | INTRAMUSCULAR | Status: AC | PRN

## 2014-07-05 MED ORDER — METHYLPREDNISOLONE SODIUM SUCC 125 MG IJ SOLR
80.0000 mg | Freq: Three times a day (TID) | INTRAMUSCULAR | Status: DC
  Administered 2014-07-05 – 2014-07-06 (×3): 80 mg via INTRAVENOUS
  Filled 2014-07-05 (×2): qty 2
  Filled 2014-07-05: qty 1.28
  Filled 2014-07-05: qty 2
  Filled 2014-07-05 (×2): qty 1.28

## 2014-07-05 MED ORDER — IRBESARTAN 300 MG PO TABS
300.0000 mg | ORAL_TABLET | Freq: Every day | ORAL | Status: DC
  Filled 2014-07-05 (×2): qty 1

## 2014-07-05 MED ORDER — ENOXAPARIN SODIUM 40 MG/0.4ML ~~LOC~~ SOLN
40.0000 mg | SUBCUTANEOUS | Status: DC
  Administered 2014-07-05 – 2014-07-06 (×2): 40 mg via SUBCUTANEOUS
  Filled 2014-07-05 (×3): qty 0.4

## 2014-07-05 MED ORDER — VANCOMYCIN HCL 10 G IV SOLR
2000.0000 mg | Freq: Once | INTRAVENOUS | Status: AC
  Administered 2014-07-05: 2000 mg via INTRAVENOUS
  Filled 2014-07-05: qty 2000
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
## 2014-07-05 NOTE — Plan of Care (Signed)
Problem: ICU Phase Progression Outcomes Goal: O2 sats trending toward baseline Outcome: Not Progressing Patient requiring increase O2 via [place], becomes hypoxic and desats with any exertion

## 2014-07-05 NOTE — Telephone Encounter (Signed)
Patient is in the hospital

## 2014-07-05 NOTE — Progress Notes (Signed)
Patient up to have BM and desated into 70's on 6L O2.  Patients O2 has been decreasing with exertion but had been quick to return to 90% until now.  RT aware, placed venti mask - sats now 90%.  Dr. Everette Rank called and made aware and informed that patient had refused dose of lasix over night.  New orders given for 20 mg IV lasix daily.  Med administered, cardio called for consult.  Will continue to monitor

## 2014-07-05 NOTE — Consult Note (Signed)
PULMONARY / CRITICAL CARE MEDICINE   Name: [name] MRN: [id number] DOB: 01/13/1956    ADMISSION DATE:  06/27/2014 CONSULTATION DATE:  07/05/14  REFERRING MD :  Dr. Luan Pulling from [place]:  Dyspnea + fever + cough  INITIAL PRESENTATION: This is a 59 year old who is in the hospital earlier this month with pneumonia and a concern about a potential lung mass. He was discharged home on oxygen. He was also felt to have sleep apnea and he has had a sleep study that was done earlier this week. I saw him in my office on the 17th and he appeared to be doing pretty well. He was still requiring relatively high flow oxygen but he was not coughing and his breathing had improved. He had a sleep study done and it was finished yesterday morning. He went home and was doing well and then developed temperature to 102.5 and became much more short of breath. He came to the emergency department and chest x-ray showed pleural effusions and what appeared to be pulmonary edema. BNP was very elevated. He had echocardiogram done last admission which showed normal systolic function and showed no diastolic abnormalities. It had been planned for him to have bronchoscopy as an outpatient once his oxygen requirement was reduced  STUDIES:  Echo 5/20: essentially normal CT chest 5/20: Progression of the bilateral peri-bronchovascular and patchy pulmonary opacity since 06/13/2014. Areas of lower lobe consolidation with air bronchograms now. Favor progressive bilateral pneumonia. No significant change in the indeterminate right hilar enlargement. This might be reactive lymphadenopathy, but tumor still cannot be excluded.  SIGNIFICANT EVENTS: 5/19 admit to [facility], antibiotic started 5/20 transferred to [facility], steroid started   HISTORY OF PRESENT ILLNESS:  See Dr. Luan Pulling' initial consult note from [facility]. In the meantime patient has been given broad spectrum abx and steroid. Patient still has cough with  minimal white sputum. 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 He drove as far as [place] and [place] sometime last year but does not recall any particular exposure.  PAST MEDICAL HISTORY :   has a past medical history of Hypertension; Bankart lesion of right shoulder (04/19/2014); and Traumatic tear of right rotator cuff (04/19/2014).  has past surgical history that includes Skin graft full thickness leg (Bilateral, 1995); Tendon repair (Left, 08/07/2012); Colonoscopy; Shoulder arthroscopy with bankart repair (Right, 04/19/2014); and Arthroscopic rotator cuff repair (Right, 04/19/2014). Prior to Admission medications   Medication Sig Start Date End Date Taking? Authorizing Provider  albuterol (PROVENTIL HFA;VENTOLIN HFA) 108 (90 BASE) MCG/ACT inhaler Inhale into the lungs every 6 (six) hours as needed for wheezing or shortness of breath.    Historical Provider, MD  baclofen (LIORESAL) 10 MG tablet Take 1 tablet (10 mg total) by mouth 3 (three) times daily. As needed for muscle spasm 04/19/14   Marchia Bond, MD  cloNIDine (CATAPRES) 0.1 MG tablet Take 1 tablet (0.1 mg total) by mouth 2 (two) times daily. 08/22/12   Carole Civil, MD  dextromethorphan-guaiFENesin [facility] DM) 30-600 MG per 12 hr tablet Take 1 tablet by mouth 2 (two) times daily.    Historical Provider, MD  HYDROMET 5-1.5 MG/5ML syrup Take 5 mLs by mouth every 4 (four) hours as needed. 06/11/14   Historical Provider, MD  irbesartan (AVAPRO) 300 MG tablet Take 1 tablet (300 mg total) by mouth daily. 06/23/14   Angus McInnis, MD  LEVITRA 20 MG tablet Take 20 mg by mouth daily as needed. For  intercourse 03/02/14   Historical Provider, MD  levofloxacin (LEVAQUIN) 500 MG tablet Take 500 mg by mouth daily.    Historical Provider, MD  oxyCODONE-acetaminophen (PERCOCET/ROXICET) 5-325 MG per tablet Take 1 tablet by mouth every 4 (four) hours as needed for severe pain. 03/11/14   Ezequiel Essex, MD  potassium chloride SA (K-DUR,KLOR-CON) 20  MEQ tablet Take 1 tablet (20 mEq total) by mouth daily. 06/23/14   Marjean Donna, MD  sennosides-docusate sodium (SENOKOT-S) 8.6-50 MG tablet Take 2 tablets by mouth daily. 04/19/14   Marchia Bond, MD  valsartan-hydrochlorothiazide (DIOVAN-HCT) 320-25 MG per tablet Take 1 tablet by mouth daily.    Historical Provider, MD   No Known Allergies  FAMILY HISTORY:  has no family status information on file.  SOCIAL HISTORY:  reports that he quit smoking about 4 years ago. His smoking use included Cigarettes. He has a 12 pack-year smoking history. He does not have any smokeless tobacco history on file. He reports that he does not drink alcohol or use illicit drugs.  REVIEW OF SYSTEMS:  Negative other than what were noted in HPI  SUBJECTIVE:   VITAL SIGNS: Temp:  [97.9 F (36.6 C)-98.8 F (37.1 C)] 98.8 F (37.1 C) (05/20 2102) Pulse Rate:  [92-129] 104 (05/20 2200) Resp:  [17-109] 31 (05/20 2200) BP: (104-151)/(55-96) 116/82 mmHg (05/20 2200) SpO2:  [83 %-100 %] 99 % (05/20 2200) FiO2 (%):  [0 %-50 %] 50 % (05/20 1410) Weight:  [105.235 kg (232 lb)] 105.235 kg (232 lb) (05/19 2351) HEMODYNAMICS:   VENTILATOR SETTINGS: Vent Mode:  [-]  FiO2 (%):  [0 %-50 %] 50 % INTAKE / OUTPUT:  Intake/Output Summary (Last 24 hours) at 07/05/14 2225 Last data filed at 07/05/14 2136  Gross per 24 hour  Intake    943 ml  Output   2350 ml  Net  -1407 ml    PHYSICAL EXAMINATION: General:  Awake, alert, oriented x 4, minimal distress in bed Neuro:  No focal weakness, no facial droop, speech normal, EOM full and equal HEENT:  Atraumatic, no stridor Cardiovascular:  RRR, no loud murmur Lungs:  Bilateral inspiratory rales most prominent in the bases, no wheeze Abdomen:  Soft, nontender, no guarding Musculoskeletal:  No gross deformities, no leg edema Skin:  No wounds  LABS:  CBC  Recent Labs Lab 07/02/2014 2230 07/05/14 0532  WBC 10.4 8.1  HGB 12.7* 12.9*  HCT 39.7 41.0  PLT 215 208    Coag's No results for input(s): APTT, INR in the last 168 hours. BMET  Recent Labs Lab 07/10/2014 2230 07/05/14 0532  NA 134* 136  K 3.9 4.4  CL 102 102  CO2 25 28  BUN 31* 26*  CREATININE 1.25* 1.18  GLUCOSE 141* 183*   Electrolytes  Recent Labs Lab 07/03/2014 2230 07/05/14 0532  CALCIUM 8.1* 8.4*   Sepsis Markers  Recent Labs Lab 06/29/2014 2244 06/25/2014 2255  LATICACIDVEN  --  1.14  PROCALCITON <0.10  --    ABG  Recent Labs Lab 07/05/14 1948  PHART 7.442  PCO2ART 44.5  PO2ART 78.9*   Liver Enzymes No results for input(s): AST, ALT, ALKPHOS, BILITOT, ALBUMIN in the last 168 hours. Cardiac Enzymes No results for input(s): TROPONINI, PROBNP in the last 168 hours. Glucose  Recent Labs Lab 07/05/14 2101  GLUCAP 164*    Imaging Dg Chest 2 View  07/05/2014   CLINICAL DATA:  Diagnosed with pneumonia 1-2 weeks ago. Decreased oxygen saturations. Shortness of breath. Former smoker.  EXAM:  CHEST  2 VIEW  COMPARISON:  06/19/2014; 06/13/2014; 06/11/2014; chest CT - 06/13/2014  FINDINGS: Grossly unchanged enlarged cardiac silhouette and mediastinal contours. Interval increase in small bilateral effusions with associated worsening bilateral mid and lower lung heterogeneous opacities. No pneumothorax. Unchanged bones.  IMPRESSION: 1. Interval development of small bilateral effusions with associated worsening bilateral mid and lower lung opacities worrisome for progression of multi focal infection, including atypical etiologies. A follow-up chest radiograph in 3 to 4 weeks after treatment is recommended to ensure resolution. 2. Cardiomegaly.  Note, underlying pulmonary edema is not excluded.   Electronically Signed   By: Sandi Mariscal M.D.   On: 06/18/2014 22:24     ASSESSMENT / PLAN:  PULMONARY OETT A: Hypoxic resp failure either PNA vs ILD exacerbation, unlikely malignancy. 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 His CT findings of enlarged LN and a somewhat peribronchovascular opacities may argue for sarcoid. Drug induced ILD is another differential. P:   Broad spectrum abx, follow up culture Continue IV steroid for now I will make him NPO midnight in case he needs bronchoscopy in AM, which is reasonable to look for unusual infectious etiology (bacterial and fungal), also may consider transbronchial Bx if sarcoid is felt possible. I also explained to the patient that if he does not get better despite current treatment and bronchoscopy, then an open lung biopsy may be considered in the future Oxygen supplementation, incentive spirometry, duoneb PRN  CARDIOVASCULAR CVL A: HTN P:  Resume HTN meds  RENAL A:  AKI resolving P:   Maintain euvolemia, avoid nephrotoxic meds  GASTROINTESTINAL A:  No acute issues P:   Check fingerstick, goal 140-180  HEMATOLOGIC A:  No acute issues P:  monitor  INFECTIOUS A:  Possible PNA P:   BCx2 pending UC  Sputum pending Abx: cefepime 5/20 >>> Levaquin 5/20 >>> Vancomycin 5/20 >>>  ENDOCRINE A:  No acute issues P:   Monitor fingerstick since patient is on steroid. ISS.  NEUROLOGIC A:  No acute issues P:   monitor RASS goal: 0 to +1    FAMILY  - Updates:   - Inter-disciplinary family meet or Palliative Care meeting due by:      TODAY'S SUMMARY: Patient with hypoxic respiratory failure, unclear infection vs some type of ILD. Less likely malignancy. On broad spectrum abx and steroid. May need bronchoscopy for BAL and Transbronchial Bx vs open lung Bx.   Critical Care time: 35 minutes  Pulmonary and [place] Pager: [phone]  07/05/2014, 10:25 PM

## 2014-07-05 NOTE — Progress Notes (Signed)
[facility] coming to transport patient, report called and given to [facility] on receiving unit.

## 2014-07-05 NOTE — Progress Notes (Signed)
Subjective: The patient is alert and comfortable. He is given dose of Lasix. X-ray shows evidence of bilateral pleural effusions and mild edema. O2 sats 95%  Objective: Vital signs in last 24 hours: Temp:  [97.9 F (36.6 C)-99.1 F (37.3 C)] 97.9 F (36.6 C) (05/20 0500) Pulse Rate:  [99-133] 104 (05/20 0600) Resp:  [17-109] 30 (05/20 0600) BP: (105-134)/(73-96) 116/74 mmHg (05/20 0600) SpO2:  [85 %-96 %] 95 % (05/20 0600) FiO2 (%):  [0 %] 0 % (05/20 0134) Weight:  [104.327 kg (230 lb)-105.235 kg (232 lb)] 105.235 kg (232 lb) (05/19 2351) Weight change:  Last BM Date: 06/28/2014  Intake/Output from previous day: 05/19 0701 - 05/20 0700 In: -  Out: 400 [Urine:400] Intake/Output this shift: Total I/O In: -  Out: 400 [Urine:400]  Physical Exam: General appearance patient is alert and oriented  HEENT negative  Neck supple no JVD or thyroid abnormalities  Lungs clear to P&A  Heart regular rhythm no murmurs  Abdomen no palpable organs or masses  Extremities free of edema   Recent Labs  07/07/2014 2230 07/05/14 0532  WBC 10.4 8.1  HGB 12.7* 12.9*  HCT 39.7 41.0  PLT 215 208   BMET  Recent Labs  07/10/2014 2230 07/05/14 0532  NA 134* 136  K 3.9 4.4  CL 102 102  CO2 25 28  GLUCOSE 141* 183*  BUN 31* 26*  CREATININE 1.25* 1.18  CALCIUM 8.1* 8.4*    Studies/Results: Dg Chest 2 View  06/22/2014   CLINICAL DATA:  Diagnosed with pneumonia 1-2 weeks ago. Decreased oxygen saturations. Shortness of breath. Former smoker.  EXAM: CHEST  2 VIEW  COMPARISON:  06/19/2014; 06/13/2014; 06/11/2014; chest CT - 06/13/2014  FINDINGS: Grossly unchanged enlarged cardiac silhouette and mediastinal contours. Interval increase in small bilateral effusions with associated worsening bilateral mid and lower lung heterogeneous opacities. No pneumothorax. Unchanged bones.  IMPRESSION: 1. Interval development of small bilateral effusions with associated worsening bilateral mid and lower lung  opacities worrisome for progression of multi focal infection, including atypical etiologies. A follow-up chest radiograph in 3 to 4 weeks after treatment is recommended to ensure resolution. 2. Cardiomegaly.  Note, underlying pulmonary edema is not excluded.   Electronically Signed   By: Sandi Mariscal M.D.   On: 06/16/2014 22:24    Medications:  . ceFEPime (MAXIPIME) IV  1 g Intravenous 3 times per day  . cloNIDine  0.1 mg Oral BID  . dextromethorphan-guaiFENesin  1 tablet Oral BID  . enoxaparin (LOVENOX) injection  40 mg Subcutaneous Q24H  . furosemide  40 mg Intravenous Once  . hydrochlorothiazide  25 mg Oral Daily  . irbesartan  300 mg Oral Daily  . [START ON 07/06/2014] pneumococcal 23 valent vaccine  0.5 mL Intramuscular Tomorrow-1000  . sodium chloride  3 mL Intravenous Q12H        Assessment/Plan: Bilateral pleural effusions-possible pneumonia plan to continue Lasix restart Maxipime-2 obtain pulmonology and cardiology consult-continue to monitor O2 sats   LOS: 0 days   Ashe Gago G 07/05/2014, 6:31 AM

## 2014-07-05 NOTE — Progress Notes (Signed)
[facility] Physician-Brief Progress Note Patient Name: [name] DOB: 1955/08/06 MRN: [id number]   Date of Service  07/05/2014  HPI/Events of Note  BL infx & mediastinal LNs for past few weeks now with acute resp failure, worsening hypoxia, some nodules date back to CT 07/2013 Pct <0.1  eICU Interventions  Ct ABx Add steroids IV 40 q 8h D/w Dr Luan Pulling - transfer to [facility] ICU     Intervention Category Major Interventions: Respiratory failure - evaluation and management  ALVA,RAKESH V. 07/05/2014, 4:16 PM

## 2014-07-05 NOTE — Progress Notes (Signed)
ANTIBIOTIC CONSULT NOTE- follow up  Pharmacy Consult for Vancomycin Indication: Pneumonia  No Known Allergies  Patient Measurements: Height: '6\' 1"'$  (185.4 cm) Weight: 232 lb (105.235 kg) IBW/kg (Calculated) : 79.9  Vital Signs: Temp: 98.2 F (36.8 C) (05/20 0800) Temp Source: Oral (05/20 0800) BP: 104/67 mmHg (05/20 1000) Pulse Rate: 96 (05/20 1000)  Labs:  Recent Labs  06/22/2014 2230 07/05/14 0532  WBC 10.4 8.1  HGB 12.7* 12.9*  PLT 215 208  CREATININE 1.25* 1.18   Estimated Creatinine Clearance: 85.8 mL/min (by C-G formula based on Cr of 1.18).  No results for input(s): VANCOTROUGH, VANCOPEAK, VANCORANDOM, GENTTROUGH, GENTPEAK, GENTRANDOM, TOBRATROUGH, TOBRAPEAK, TOBRARND, AMIKACINPEAK, AMIKACINTROU, AMIKACIN in the last 72 hours.   Microbiology: Recent Results (from the past 720 hour(s))  Blood culture (routine x 2)     Status: None   Collection Time: 06/13/14 10:42 PM  Result Value Ref Range Status   Specimen Description BLOOD LEFT ANTECUBITAL  Final   Special Requests BOTTLES DRAWN AEROBIC AND ANAEROBIC [place]  Final   Culture NO GROWTH 5 DAYS  Final   Report Status 06/18/2014 FINAL  Final  Blood culture (routine x 2)     Status: None   Collection Time: 06/13/14 10:52 PM  Result Value Ref Range Status   Specimen Description BLOOD RIGHT ANTECUBITAL  Final   Special Requests   Final    BOTTLES DRAWN AEROBIC AND ANAEROBIC AEB=5CC ANA=6CC   Culture NO GROWTH 5 DAYS  Final   Report Status 06/18/2014 FINAL  Final  MRSA PCR Screening     Status: None   Collection Time: 06/14/14  1:00 AM  Result Value Ref Range Status   MRSA by PCR NEGATIVE NEGATIVE Final    Comment:        The GeneXpert MRSA Assay (FDA approved for NASAL specimens only), is one component of a comprehensive MRSA colonization surveillance program. It is not intended to diagnose MRSA infection nor to guide or monitor treatment for MRSA infections.   Culture, blood (routine x 2)     Status:  None   Collection Time: 06/20/14  7:22 AM  Result Value Ref Range Status   Specimen Description BLOOD LEFT ANTECUBITAL  Final   Special Requests BOTTLES DRAWN AEROBIC ONLY 5CC  Final   Culture NO GROWTH 5 DAYS  Final   Report Status 06/25/2014 FINAL  Final  Culture, blood (routine x 2)     Status: None   Collection Time: 06/20/14  8:53 AM  Result Value Ref Range Status   Specimen Description BLOOD LEFT ARM  Final   Special Requests   Final    BOTTLES DRAWN AEROBIC AND ANAEROBIC AEB=12CC ANA=8CC   Culture NO GROWTH 5 DAYS  Final   Report Status 06/25/2014 FINAL  Final  Culture, blood (routine x 2)     Status: None (Preliminary result)   Collection Time: 07/15/2014 10:30 PM  Result Value Ref Range Status   Specimen Description BLOOD LEFT ARM  Final   Special Requests BOTTLES DRAWN AEROBIC AND ANAEROBIC 8CC EACH  Final   Culture NO GROWTH 1 DAY  Final   Report Status PENDING  Incomplete  Culture, blood (routine x 2)     Status: None (Preliminary result)   Collection Time: 06/23/2014 10:40 PM  Result Value Ref Range Status   Specimen Description BLOOD LEFT HAND  Final   Special Requests BOTTLES DRAWN AEROBIC AND ANAEROBIC [place]  Final   Culture NO GROWTH 1 DAY  Final  Report Status PENDING  Incomplete  MRSA PCR Screening     Status: None   Collection Time: 07/05/14  1:38 AM  Result Value Ref Range Status   MRSA by PCR NEGATIVE NEGATIVE Final    Comment:        The GeneXpert MRSA Assay (FDA approved for NASAL specimens only), is one component of a comprehensive MRSA colonization surveillance program. It is not intended to diagnose MRSA infection nor to guide or monitor treatment for MRSA infections.    Medical History: Past Medical History  Diagnosis Date  . Hypertension   . 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 Traumatic tear of right rotator cuff 04/19/2014   Medications:  Cefepime 1 Gm IV given in the ED on 07/06/2014 at 2300  Anti-infectives    Start      Dose/Rate Route Frequency Ordered Stop   07/05/14 2100  vancomycin (VANCOCIN) 1,250 mg in sodium chloride 0.9 % 250 mL IVPB     1,250 mg 166.7 mL/hr over 90 Minutes Intravenous Every 12 hours 07/05/14 1104     07/05/14 0900  vancomycin (VANCOCIN) 2,000 mg in sodium chloride 0.9 % 500 mL IVPB     2,000 mg 250 mL/hr over 120 Minutes Intravenous  Once 07/05/14 0757 07/05/14 1100   07/05/14 0900  levofloxacin (LEVAQUIN) IVPB 500 mg     500 mg 100 mL/hr over 60 Minutes Intravenous Every 24 hours 07/05/14 0813     07/05/14 0600  ceFEPIme (MAXIPIME) 1 g in dextrose 5 % 50 mL IVPB     1 g 100 mL/hr over 30 Minutes Intravenous 3 times per day 07/05/14 0133 07/13/14 0559   07/06/2014 2330  vancomycin (VANCOCIN) 2,000 mg in sodium chloride 0.9 % 500 mL IVPB  Status:  Discontinued     2,000 mg 250 mL/hr over 120 Minutes Intravenous  Once 07/11/2014 2326 07/05/14 0133   07/11/2014 2300  ceFEPIme (MAXIPIME) 1 g in dextrose 5 % 50 mL IVPB     1 g 100 mL/hr over 30 Minutes Intravenous  Once 06/25/2014 2246 07/05/14 0051     Assessment: 59 yo male recently treated for pneumonia; s/p hospital discharge 06/23/14; now with fever and increased SOB. Chest x-ray shows infiltrates/opacities; Empiric antibiotics for HCAP.  Goal of Therapy:  Vancomycin troughs 15-20 mcg/ml  Plan:   Vancomycin '2000mg'$  IV loading dose x 1 (given) then  Vancomycin '1250mg'$  IV q12hrs  Continue Cefepime 1gm IV q8hrs  Monitor labs, renal fxn, and cultures  Deescalate antibiotics when appropriate  Ena Dawley, RPH 07/05/2014,11:04 AM

## 2014-07-05 NOTE — Telephone Encounter (Signed)
PLS CALL DR. MCINNIS @ [phone]

## 2014-07-05 NOTE — Consult Note (Signed)
Consult requested by: Dr. Everette Rank Consult requested for respiratory failure:  HPI: This is a 59 year old who is in the hospital earlier this month with pneumonia and a concern about a potential lung mass. He was discharged home on oxygen. He was also felt to have sleep apnea and he has had a sleep study that was done earlier this week. I saw him in my office on the 17th and he appeared to be doing pretty well. He was still requiring relatively high flow oxygen but he was not coughing and his breathing had improved. He had a sleep study done and it was finished yesterday morning. He went home and was doing well and then developed temperature to 102.5 and became much more short of breath. He came to the emergency department and chest x-ray showed pleural effusions and what appeared to be pulmonary edema. BNP was very elevated. He had echocardiogram done last admission which showed normal systolic function and showed no diastolic abnormalities. It had been planned for him to have bronchoscopy as an outpatient once his oxygen requirement was reduced.  Past Medical History  Diagnosis Date  . Hypertension   . 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 Traumatic tear of right rotator cuff 04/19/2014     History reviewed. No pertinent family history.   History   Social History  . Marital Status: Married    Spouse Name: N/A  . Number of Children: N/A  . Years of Education: N/A   Social History Main Topics  . Smoking status: Former Smoker -- 1.00 packs/day for 12 years    Types: Cigarettes    Quit date: 04/17/2010  . Smokeless tobacco: Not on file  . Alcohol Use: No  . Drug Use: No  . Sexual Activity: Yes    Birth Control/ Protection: None   Other Topics Concern  . None   Social History Narrative     ROS: He did not have any chills. He did cough. He has not had any chest pain. He has not coughed up any blood. The rest as per the history and physical    Objective: Vital signs in  last 24 hours: Temp:  [97.9 F (36.6 C)-99.1 F (37.3 C)] 97.9 F (36.6 C) (05/20 0500) Pulse Rate:  [99-133] 104 (05/20 0600) Resp:  [17-109] 30 (05/20 0600) BP: (105-134)/(73-96) 116/74 mmHg (05/20 0600) SpO2:  [85 %-96 %] 95 % (05/20 0600) FiO2 (%):  [0 %] 0 % (05/20 0134) Weight:  [104.327 kg (230 lb)-105.235 kg (232 lb)] 105.235 kg (232 lb) (05/19 2351) Weight change:  Last BM Date: 07/14/2014  Intake/Output from previous day: 05/19 0701 - 05/20 0700 In: -  Out: 400 [Urine:400]  PHYSICAL EXAM He is awake and alert. He looks fairly comfortable. He is able to lie flat. Oxygen saturation is in the 90s. His HEENT exam is unremarkable except he has some thrush. His chest is relatively clear. His heart is regular without gallop. His abdomen is soft. He doesn't have significant edema. Central nervous system examination is grossly intact  Lab Results: Basic Metabolic Panel:  Recent Labs  06/19/2014 2230 07/05/14 0532  NA 134* 136  K 3.9 4.4  CL 102 102  CO2 25 28  GLUCOSE 141* 183*  BUN 31* 26*  CREATININE 1.25* 1.18  CALCIUM 8.1* 8.4*   Liver Function Tests: No results for input(s): AST, ALT, ALKPHOS, BILITOT, PROT, ALBUMIN in the last 72 hours. No results for input(s): LIPASE, AMYLASE in the last 72 hours. No  results for input(s): AMMONIA in the last 72 hours. CBC:  Recent Labs  06/16/2014 2230 07/05/14 0532  WBC 10.4 8.1  NEUTROABS 8.9* 6.5  HGB 12.7* 12.9*  HCT 39.7 41.0  MCV 90.4 91.3  PLT 215 208   Cardiac Enzymes: No results for input(s): CKTOTAL, CKMB, CKMBINDEX, TROPONINI in the last 72 hours. BNP: No results for input(s): PROBNP in the last 72 hours. D-Dimer: No results for input(s): DDIMER in the last 72 hours. CBG: No results for input(s): GLUCAP in the last 72 hours. Hemoglobin A1C: No results for input(s): HGBA1C in the last 72 hours. Fasting Lipid Panel: No results for input(s): CHOL, HDL, LDLCALC, TRIG, CHOLHDL, LDLDIRECT in the last 72  hours. Thyroid Function Tests: No results for input(s): TSH, T4TOTAL, FREET4, T3FREE, THYROIDAB in the last 72 hours. Anemia Panel: No results for input(s): VITAMINB12, FOLATE, FERRITIN, TIBC, IRON, RETICCTPCT in the last 72 hours. Coagulation: No results for input(s): LABPROT, INR in the last 72 hours. Urine Drug Screen: Drugs of Abuse  No results found for: LABOPIA, COCAINSCRNUR, LABBENZ, AMPHETMU, THCU, LABBARB  Alcohol Level: No results for input(s): ETH in the last 72 hours. Urinalysis:  Recent Labs  07/05/14 0142  COLORURINE YELLOW  LABSPEC >1.030*  PHURINE 5.5  GLUCOSEU NEGATIVE  HGBUR NEGATIVE  BILIRUBINUR NEGATIVE  KETONESUR NEGATIVE  PROTEINUR NEGATIVE  UROBILINOGEN 0.2  NITRITE NEGATIVE  LEUKOCYTESUR NEGATIVE   Misc. Labs:   ABGS: No results for input(s): PHART, PO2ART, TCO2, HCO3 in the last 72 hours.  Invalid input(s): PCO2   MICROBIOLOGY: Recent Results (from the past 240 hour(s))  Culture, blood (routine x 2)     Status: None (Preliminary result)   Collection Time: 07/13/2014 10:30 PM  Result Value Ref Range Status   Specimen Description BLOOD LEFT ARM  Final   Special Requests BOTTLES DRAWN AEROBIC AND ANAEROBIC 8CC EACH  Final   Culture NO GROWTH 1 DAY  Final   Report Status PENDING  Incomplete  Culture, blood (routine x 2)     Status: None (Preliminary result)   Collection Time: 06/24/2014 10:40 PM  Result Value Ref Range Status   Specimen Description BLOOD LEFT HAND  Final   Special Requests BOTTLES DRAWN AEROBIC AND ANAEROBIC 6CC EACH  Final   Culture NO GROWTH 1 DAY  Final   Report Status PENDING  Incomplete  MRSA PCR Screening     Status: None   Collection Time: 07/05/14  1:38 AM  Result Value Ref Range Status   MRSA by PCR NEGATIVE NEGATIVE Final    Comment:        The GeneXpert MRSA Assay (FDA approved for NASAL specimens only), is one component of a comprehensive MRSA colonization surveillance program. It is not intended to diagnose  MRSA infection nor to guide or monitor treatment for MRSA infections.     Studies/Results: Dg Chest 2 View  07/07/2014   CLINICAL DATA:  Diagnosed with pneumonia 1-2 weeks ago. Decreased oxygen saturations. Shortness of breath. Former smoker.  EXAM: CHEST  2 VIEW  COMPARISON:  06/19/2014; 06/13/2014; 06/11/2014; chest CT - 06/13/2014  FINDINGS: Grossly unchanged enlarged cardiac silhouette and mediastinal contours. Interval increase in small bilateral effusions with associated worsening bilateral mid and lower lung heterogeneous opacities. No pneumothorax. Unchanged bones.  IMPRESSION: 1. Interval development of small bilateral effusions with associated worsening bilateral mid and lower lung opacities worrisome for progression of multi focal infection, including atypical etiologies. A follow-up chest radiograph in 3 to 4 weeks after treatment is  recommended to ensure resolution. 2. Cardiomegaly.  Note, underlying pulmonary edema is not excluded.   Electronically Signed   By: Sandi Mariscal M.D.   On: 07/14/2014 22:24    Medications:  Prior to Admission:  Prescriptions prior to admission  Medication Sig Dispense Refill Last Dose  . albuterol (PROVENTIL HFA;VENTOLIN HFA) 108 (90 BASE) MCG/ACT inhaler Inhale into the lungs every 6 (six) hours as needed for wheezing or shortness of breath.   06/13/2014 at Unknown time  . baclofen (LIORESAL) 10 MG tablet Take 1 tablet (10 mg total) by mouth 3 (three) times daily. As needed for muscle spasm 50 tablet 0 Past Week at Unknown time  . cloNIDine (CATAPRES) 0.1 MG tablet Take 1 tablet (0.1 mg total) by mouth 2 (two) times daily. 60 tablet 0 06/12/2014 at Unknown time  . dextromethorphan-guaiFENesin (MUCINEX DM) 30-600 MG per 12 hr tablet Take 1 tablet by mouth 2 (two) times daily.   06/13/2014 at Unknown time  . HYDROMET 5-1.5 MG/5ML syrup Take 5 mLs by mouth every 4 (four) hours as needed.  0 06/12/2014 at Unknown time  . irbesartan (AVAPRO) 300 MG tablet Take 1  tablet (300 mg total) by mouth daily. 30 tablet 5   . LEVITRA 20 MG tablet Take 20 mg by mouth daily as needed. For intercourse  2 Past Month at Unknown time  . levofloxacin (LEVAQUIN) 500 MG tablet Take 500 mg by mouth daily.   06/13/2014 at Unknown time  . oxyCODONE-acetaminophen (PERCOCET/ROXICET) 5-325 MG per tablet Take 1 tablet by mouth every 4 (four) hours as needed for severe pain. 15 tablet 0 06/12/2014 at Unknown time  . potassium chloride SA (K-DUR,KLOR-CON) 20 MEQ tablet Take 1 tablet (20 mEq total) by mouth daily. 30 tablet 2   . sennosides-docusate sodium (SENOKOT-S) 8.6-50 MG tablet Take 2 tablets by mouth daily. 30 tablet 1 06/12/2014 at Unknown time  . valsartan-hydrochlorothiazide (DIOVAN-HCT) 320-25 MG per tablet Take 1 tablet by mouth daily.   06/13/2014 at Unknown time   Scheduled: . ceFEPime (MAXIPIME) IV  1 g Intravenous 3 times per day  . cloNIDine  0.1 mg Oral BID  . dextromethorphan-guaiFENesin  1 tablet Oral BID  . enoxaparin (LOVENOX) injection  40 mg Subcutaneous Q24H  . furosemide  40 mg Intravenous Once  . hydrochlorothiazide  25 mg Oral Daily  . irbesartan  300 mg Oral Daily  . magic mouthwash  5 mL Oral TID PC & HS  . [START ON 07/06/2014] pneumococcal 23 valent vaccine  0.5 mL Intramuscular Tomorrow-1000  . sodium chloride  3 mL Intravenous Q12H  . vancomycin  2,000 mg Intravenous Once   Continuous:  JKD:TOIZTI chloride, ondansetron (ZOFRAN) IV, oxyCODONE-acetaminophen, sodium chloride  Assesment: He was admitted with acute respiratory failure. He is improved. Chest x-ray was consistent with pulmonary edema and his BNP was up but he had essentially normal echocardiogram last month.  He has had pneumonia and he has fever again.  He has mild chronic kidney disease  He has thrush Principal Problem:   Acute respiratory failure Active Problems:   PNA (pneumonia)   Lung mass   CKD (chronic kidney disease)   SOB (shortness of breath)    Hypertension    Plan: When I saw him in my office I had ordered another CT to be done in about 3 weeks. Since he is in the hospital has fever and no definite pneumonia on chest x-ray I think he needs another CT chest. Continue antibiotics.add levaquin at  least temporarily until we see what the CT looks like    LOS: 0 days   Abiha Lukehart L 07/05/2014, 8:06 AM

## 2014-07-05 NOTE — Care Management Note (Signed)
Case Management Note  Patient Details  Name: [name] MRN: [id number] Date of Birth: 1955/11/16   Expected Discharge Date:                  Expected Discharge Plan:  Home/Self Care  In-House Referral:  NA  Discharge planning Services  CM Consult  Post Acute Care Choice:  NA Choice offered to:  NA  DME Arranged:    DME Agency:     HH Arranged:    [place] Agency:     Status of Service:  Completed, signed off  Medicare Important Message Given:    Date Medicare IM Given:    Medicare IM give by:    Date Additional Medicare IM Given:    Additional Medicare Important Message give by:     If discussed at [place] of Stay Meetings, dates discussed:    Additional Comments: Pt is from home and independent at baseline. Pt recently discharged home with home O2. Pt has completed a sleep study but does not yet have a CPAP. Pulmonologist is in the process of making those arrangements. Pt plans to return home with self care at DC. No CM needs anticipated, will cont to follow if not discharged over weekend.  Sherald Barge, RN 07/05/2014, 3:11 PM

## 2014-07-05 NOTE — ED Provider Notes (Signed)
The patient is a 59 year old male, he has had a rapidly progressive respiratory illness over the last several months, this culminated in an admission to the hospital for what appeared to be community-acquired pneumonia, he was hypoxic with a oxygen requirement on discharge and presents back today with progressive dyspnea on exertion and hypoxia. On exam the patient has diffuse bilateral pulmonary rales heard at the bases and mid lung, no peripheral edema, mild sinus tachycardia, soft abdomen, speaking in shortened sentences, requiring high flow nasal cannula and still hypoxic. Review of the medical record shows that he had a CT scan of the chest showing a possible bronchogenic carcinoma with some hilar lymphadenopathy and pneumonia and possibly pulmonary edema, initial echocardiogram 3 weeks ago was negative for congestive heart failure, chest x-ray today shows progressive bilateral pleural effusions with increasing pulmonary infiltrates. He has been treated with broad-spectrum at about X, he will be admitted to the hospital   EKG Interpretation  Date/Time:  Thursday Jul 04 2014 22:25:11 EDT Ventricular Rate:  113 PR Interval:  134 QRS Duration: 93 QT Interval:  318 QTC Calculation: 436 R Axis:   -8 Text Interpretation:  Sinus tachycardia RSR' in V1 or V2, probably normal variant Left anterior fasicular block Abnormal ekg since last tracing no significant change Confirmed by Sabra Heck  MD, Daniesha Driver ([id number]) on 07/08/2014 11:33:08 PM       Medical screening examination/treatment/procedure(s) were conducted as a shared visit with non-physician practitioner(s) and myself.  I personally evaluated the patient during the encounter.  Clinical Impression:   Final diagnoses:  HCAP (healthcare-associated pneumonia)  Hypoxia         Noemi Chapel, MD 07/03/2014 0930
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
## 2014-07-06 DIAGNOSIS — J9601 Acute respiratory failure with hypoxia: Secondary | ICD-10-CM

## 2014-07-06 DIAGNOSIS — R918 Other nonspecific abnormal finding of lung field: Secondary | ICD-10-CM

## 2014-07-06 LAB — GLUCOSE, CAPILLARY
GLUCOSE-CAPILLARY: 228 mg/dL — AB (ref 65–99)
GLUCOSE-CAPILLARY: 93 mg/dL (ref 65–99)
Glucose-Capillary: 140 mg/dL — ABNORMAL HIGH (ref 65–99)
Glucose-Capillary: 119 mg/dL — ABNORMAL HIGH (ref 65–99)
Glucose-Capillary: 191 mg/dL — ABNORMAL HIGH (ref 65–99)
Glucose-Capillary: 209 mg/dL — ABNORMAL HIGH (ref 65–99)

## 2014-07-06 LAB — EXPECTORATED SPUTUM ASSESSMENT W GRAM STAIN, RFLX TO RESP C

## 2014-07-06 LAB — HIV ANTIBODY (ROUTINE TESTING W REFLEX): HIV Screen 4th Generation wRfx: NONREACTIVE

## 2014-07-06 MED ORDER — INSULIN ASPART 100 UNIT/ML ~~LOC~~ SOLN
0.0000 [IU] | Freq: Every day | SUBCUTANEOUS | Status: DC
  Administered 2014-07-06 – 2014-07-11 (×2): 2 [IU] via SUBCUTANEOUS

## 2014-07-06 MED ORDER — LEVOFLOXACIN IN D5W 750 MG/150ML IV SOLN: 750.0000 mg | INTRAVENOUS | Status: DC

## 2014-07-06 MED ORDER — METHYLPREDNISOLONE SODIUM SUCC 40 MG IJ SOLR
40.0000 mg | Freq: Two times a day (BID) | INTRAMUSCULAR | Status: DC
  Administered 2014-07-06 – 2014-07-13 (×14): 40 mg via INTRAVENOUS
  Filled 2014-07-06 (×16): qty 1

## 2014-07-06 MED ORDER — IRBESARTAN 300 MG PO TABS
300.0000 mg | ORAL_TABLET | Freq: Every day | ORAL | Status: DC
  Administered 2014-07-06 – 2014-07-11 (×6): 300 mg via ORAL
  Filled 2014-07-06 (×7): qty 1

## 2014-07-06 MED ORDER — INSULIN ASPART 100 UNIT/ML ~~LOC~~ SOLN
0.0000 [IU] | Freq: Three times a day (TID) | SUBCUTANEOUS | Status: DC
  Administered 2014-07-06 – 2014-07-07 (×2): 3 [IU] via SUBCUTANEOUS
  Administered 2014-07-07: 5 [IU] via SUBCUTANEOUS
  Administered 2014-07-07 – 2014-07-08 (×2): 2 [IU] via SUBCUTANEOUS
  Administered 2014-07-08: 3 [IU] via SUBCUTANEOUS
  Administered 2014-07-08: 2 [IU] via SUBCUTANEOUS
  Administered 2014-07-09 – 2014-07-10 (×4): 3 [IU] via SUBCUTANEOUS
  Administered 2014-07-11: 8 [IU] via SUBCUTANEOUS
  Administered 2014-07-11: 5 [IU] via SUBCUTANEOUS
  Administered 2014-07-11 – 2014-07-12 (×2): 2 [IU] via SUBCUTANEOUS
  Administered 2014-07-12: 3 [IU] via SUBCUTANEOUS

## 2014-07-06 MED ORDER — LEVOFLOXACIN 750 MG PO TABS
750.0000 mg | ORAL_TABLET | Freq: Every day | ORAL | Status: DC
  Administered 2014-07-07 – 2014-07-09 (×3): 750 mg via ORAL
  Filled 2014-07-06 (×3): qty 1

## 2014-07-06 MED ORDER — HYDROCHLOROTHIAZIDE 25 MG PO TABS
25.0000 mg | ORAL_TABLET | Freq: Every day | ORAL | Status: DC
Start: 2014-07-06 — End: 2014-07-12
  Administered 2014-07-06 – 2014-07-11 (×6): 25 mg via ORAL
  Filled 2014-07-06 (×7): qty 1

## 2014-07-06 MED ORDER — IPRATROPIUM-ALBUTEROL 0.5-2.5 (3) MG/3ML IN SOLN
3.0000 mL | RESPIRATORY_TRACT | Status: DC | PRN
Start: 2014-07-06 — End: 2014-07-13
  Administered 2014-07-06 – 2014-07-11 (×12): 3 mL via RESPIRATORY_TRACT
  Filled 2014-07-06 (×13): qty 3

## 2014-07-06 NOTE — Sleep Study (Signed)
  Alvin A. Merlene Laughter, MD     [email or web address]        NOCTURNAL POLYSOMNOGRAM    LOCATION: SLEEP LAB FACILITY: [place]   PHYSICIAN: Heriberto Stmartin A. Merlene Miller, M.D.   DATE OF STUDY: 07/03/2014.   REFERRING PHYSICIAN: Sinda Du.   INDICATIONS: The patient is a 59 year old who presents with fatigue and snoring along with oxygen desaturation.  MEDICATIONS:  Prior to Admission medications   Medication Sig Start Date End Date Taking? Authorizing Provider  albuterol (PROVENTIL HFA;VENTOLIN HFA) 108 (90 BASE) MCG/ACT inhaler Inhale into the lungs every 6 (six) hours as needed for wheezing or shortness of breath.    Historical Provider, MD  baclofen (LIORESAL) 10 MG tablet Take 1 tablet (10 mg total) by mouth 3 (three) times daily. As needed for muscle spasm 04/19/14   Marchia Bond, MD  cloNIDine (CATAPRES) 0.1 MG tablet Take 1 tablet (0.1 mg total) by mouth 2 (two) times daily. 08/22/12   Carole Civil, MD  dextromethorphan-guaiFENesin [facility] DM) 30-600 MG per 12 hr tablet Take 1 tablet by mouth 2 (two) times daily.    Historical Provider, MD  HYDROMET 5-1.5 MG/5ML syrup Take 5 mLs by mouth every 4 (four) hours as needed. 06/11/14   Historical Provider, MD  LEVITRA 20 MG tablet Take 20 mg by mouth daily as needed. For intercourse 03/02/14   Historical Provider, MD  levofloxacin (LEVAQUIN) 500 MG tablet Take 500 mg by mouth daily.    Historical Provider, MD  oxyCODONE-acetaminophen (PERCOCET/ROXICET) 5-325 MG per tablet Take 1 tablet by mouth every 4 (four) hours as needed for severe pain. 03/11/14   Ezequiel Essex, MD  potassium chloride SA (K-DUR,KLOR-CON) 20 MEQ tablet Take 1 tablet (20 mEq total) by mouth daily. 06/23/14   Marjean Donna, MD  sennosides-docusate sodium (SENOKOT-S) 8.6-50 MG tablet Take 2 tablets by mouth daily. 04/19/14   Marchia Bond, MD  valsartan-hydrochlorothiazide (DIOVAN-HCT) 320-25 MG per tablet Take 1 tablet by mouth daily.    Historical  Provider, MD      EPWORTH SLEEPINESS SCALE: 3.   BMI: 32.   ARCHITECTURAL SUMMARY: Total recording time was 364 minutes. Sleep efficiency 73 %. Sleep latency 5 minutes. REM latency 141 minutes. Stage NI 7 %, N2 34 % and N3 32 % and REM sleep 28 %.    RESPIRATORY DATA:  The study is done on 4 L nasal oxygen per order of the referring physician. Baseline oxygen saturation is 91 %. The lowest saturation is 80 %. The diagnostic AHI is 1. The RDI is 2. The REM AHI is 0. The patient had prolonged episodes of desaturation without obstructive events even on 4 L. Minutes spend with saturation below 88% is 51.  LIMB MOVEMENT SUMMARY: PLM index 0.   ELECTROCARDIOGRAM SUMMARY: Average heart rate is 94 with no significant dysrhythmias observed.   IMPRESSION:  1. Marked hypoventilation syndrome. Consider increasing nocturnal supper and oxygen to 6 L.  Thanks for this referral.  Reighlyn Elmes A. Merlene Miller, M.D. Diplomat, Tax adviser of Sleep Medicine.

## 2014-07-06 NOTE — Progress Notes (Signed)
ANTIBIOTIC CONSULT NOTE - FOLLOW UP  Pharmacy Consult for: vancomycin, adjustment of cefepime and levofloxacin Indication: pneumonia  No Known Allergies  Patient Measurements: Height: '6\' 1"'$  (185.4 cm) Weight: 225 lb 5 oz (102.2 kg) IBW/kg (Calculated) : 79.9   Vital Signs: Temp: 99 F (37.2 C) (05/21 1147) Temp Source: Oral (05/21 1147) BP: 130/83 mmHg (05/21 1100) Pulse Rate: 102 (05/21 1115) Intake/Output from previous day: 05/20 0701 - 05/21 0700 In: 1271.8 [P.O.:240; I.V.:81.8; IV Piggyback:950] Out: 2650 [Urine:2650] Intake/Output from this shift: Total I/O In: 160 [P.O.:60; IV Piggyback:100] Out: 200 [Urine:200]  Labs:  Recent Labs  06/30/2014 2230 07/05/14 0532  WBC Alvin.4 8.1  HGB 12.7* 12.9*  PLT 215 208  CREATININE 1.25* 1.18   Estimated Creatinine Clearance: 84.7 mL/min (by C-G formula based on Cr of 1.18). No results for input(s): VANCOTROUGH, VANCOPEAK, VANCORANDOM, GENTTROUGH, GENTPEAK, GENTRANDOM, TOBRATROUGH, TOBRAPEAK, TOBRARND, AMIKACINPEAK, AMIKACINTROU, AMIKACIN in the last 72 hours.   Microbiology: Recent Results (from the past 720 hour(s))  Blood culture (routine x 2)     Status: None   Collection Time: 06/13/14 Alvin:42 PM  Result Value Ref Range Status   Specimen Description BLOOD LEFT ANTECUBITAL  Final   Special Requests BOTTLES DRAWN AEROBIC AND ANAEROBIC [place]  Final   Culture NO GROWTH 5 DAYS  Final   Report Status 06/18/2014 FINAL  Final  Blood culture (routine x 2)     Status: None   Collection Time: 06/13/14 Alvin:52 PM  Result Value Ref Range Status   Specimen Description BLOOD RIGHT ANTECUBITAL  Final   Special Requests   Final    BOTTLES DRAWN AEROBIC AND ANAEROBIC AEB=5CC ANA=6CC   Culture NO GROWTH 5 DAYS  Final   Report Status 06/18/2014 FINAL  Final  MRSA PCR Screening     Status: None   Collection Time: 06/14/14  1:00 AM  Result Value Ref Range Status   MRSA by PCR NEGATIVE NEGATIVE Final    Comment:        The GeneXpert  MRSA Assay (FDA approved for NASAL specimens only), is one component of a comprehensive MRSA colonization surveillance program. It is not intended to diagnose MRSA infection nor to guide or monitor treatment for MRSA infections.   Culture, blood (routine x 2)     Status: None   Collection Time: 06/20/14  7:22 AM  Result Value Ref Range Status   Specimen Description BLOOD LEFT ANTECUBITAL  Final   Special Requests BOTTLES DRAWN AEROBIC ONLY 5CC  Final   Culture NO GROWTH 5 DAYS  Final   Report Status 05/Alvin/2016 FINAL  Final  Culture, blood (routine x 2)     Status: None   Collection Time: 06/20/14  8:53 AM  Result Value Ref Range Status   Specimen Description BLOOD LEFT ARM  Final   Special Requests   Final    BOTTLES DRAWN AEROBIC AND ANAEROBIC AEB=12CC ANA=8CC   Culture NO GROWTH 5 DAYS  Final   Report Status 05/Alvin/2016 FINAL  Final  Culture, blood (routine x 2)     Status: None (Preliminary result)   Collection Time: 06/26/2014 Alvin:30 PM  Result Value Ref Range Status   Specimen Description BLOOD LEFT ARM  Final   Special Requests BOTTLES DRAWN AEROBIC AND ANAEROBIC 8CC EACH  Final   Culture NO GROWTH 2 DAYS  Final   Report Status PENDING  Incomplete  Culture, blood (routine x 2)     Status: None (Preliminary result)   Collection Time:  07/16/2014 Alvin:40 PM  Result Value Ref Range Status   Specimen Description BLOOD LEFT HAND  Final   Special Requests BOTTLES DRAWN AEROBIC AND ANAEROBIC 6CC EACH  Final   Culture NO GROWTH 2 DAYS  Final   Report Status PENDING  Incomplete  MRSA PCR Screening     Status: None   Collection Time: 07/05/14  1:38 AM  Result Value Ref Range Status   MRSA by PCR NEGATIVE NEGATIVE Final    Comment:        The GeneXpert MRSA Assay (FDA approved for NASAL specimens only), is one component of a comprehensive MRSA colonization surveillance program. It is not intended to diagnose MRSA infection nor to guide or monitor treatment for MRSA infections.    Culture, sputum-assessment     Status: None   Collection Time: 07/05/14 11:22 AM  Result Value Ref Range Status   Specimen Description SPU  Final   Special Requests NONE  Final   Sputum evaluation   Final    MICROSCOPIC FINDINGS SUGGEST THAT THIS SPECIMEN IS NOT REPRESENTATIVE OF LOWER RESPIRATORY SECRETIONS. PLEASE RECOLLECT.   Report Status 07/06/2014 FINAL  Final    Anti-infectives    Start     Dose/Rate Route Frequency Ordered Stop   07/05/14 2100  vancomycin (VANCOCIN) 1,250 mg in sodium chloride 0.9 % 250 mL IVPB     1,250 mg 166.7 mL/hr over 90 Minutes Intravenous Every 12 hours 07/05/14 1104     07/05/14 0900  vancomycin (VANCOCIN) 2,000 mg in sodium chloride 0.9 % 500 mL IVPB     2,000 mg 250 mL/hr over 120 Minutes Intravenous  Once 07/05/14 0757 07/05/14 1100   07/05/14 0900  levofloxacin (LEVAQUIN) IVPB 500 mg     500 mg 100 mL/hr over 60 Minutes Intravenous Every 24 hours 07/05/14 0813     07/05/14 0600  ceFEPIme (MAXIPIME) 1 g in dextrose 5 % 50 mL IVPB     1 g 100 mL/hr over 30 Minutes Intravenous 3 times per day 07/05/14 0133 07/13/14 0559   07/11/2014 2330  vancomycin (VANCOCIN) 2,000 mg in sodium chloride 0.9 % 500 mL IVPB  Status:  Discontinued     2,000 mg 250 mL/hr over 120 Minutes Intravenous  Once 07/08/2014 2326 07/05/14 0133   07/14/2014 2300  ceFEPIme (MAXIPIME) 1 g in dextrose 5 % 50 mL IVPB     1 g 100 mL/hr over 30 Minutes Intravenous  Once 07/06/2014 2246 07/05/14 0051      Assessment: [name] originally diagnosed with "walking PNA" 2 months ago- treated with outpt abx, but progressed and he presented to the ED >2 weeks ago with ARF and BL PNA- treated with broad spectrum abx and d/c'd to complete another 7 days of LVQ (also sent home on O2)- was discharged 06/23/2014.  Presented to [facility] on 5/20 and was transferred to [facility] d/t concern for intubation needs and ICU care here.  SCr 1.18, CrCl ~80-105m/min. WBC 8.1, afebrile.  Goal of Therapy:  Vancomycin  trough level 15-20 mcg/ml  Plan:  -continue cefepime 1g IV q8h -continue vancomycin '1250mg'$  IV q12h -increase levofloxacin to '750mg'$  IV q24h -follow c/s, clinical progression, need for bronch/intubation, renal function, [place] Remmie Bembenek, PharmD, BCPS Clinical Pharmacist Pager: [phone]/21/2016 11:55 AM

## 2014-07-06 NOTE — Progress Notes (Signed)
Received from 2M15 via wheelchair with 50% venturi mask in place. Per patient, wife aware of transfer and will be here shortly.Only valuables with patient is his cell phone and pajamas. Assessment and VS as documented.

## 2014-07-06 NOTE — Progress Notes (Signed)
Initial Nutrition Assessment  DOCUMENTATION CODES:  Non-severe (moderate) malnutrition in context of acute illness/injury  INTERVENTION: - RD will monitor for diet advancement and needs at that time  NUTRITION DIAGNOSIS:  Inadequate oral intake related to inability to eat as evidenced by NPO status.  GOAL:  Patient will meet greater than or equal to 90% of their needs  MONITOR:  Diet advancement, PO intake, Weight trends, I & O's, Labs  REASON FOR ASSESSMENT:  Malnutrition Screening Tool  ASSESSMENT: Per H&P, 59 yo male h/o HTN previously very healthy up until 2 months ago when he was diagnosed with walking PNA. Was treated as outpatient with antibiotics, worsened and presented to the ED over 2 weeks ago when he was hospitalized with acute hypoxid respiratory failure and bilateral PNA.Pt use to smoke 2-3 packs/day cigerettes for about 12 years, has quit due to this illness.  Pt seen for MST: 2 with BMI indicating overweight status. Pt has been NPO. He states PTA he had a very good appetite but has been unable to eat as much as he wants recently due to sores/thrush that have caused pain and taste alteration. He states he recently starting having diarrhea as well which he feels is related to medications. He states that 1 month ago he was at UBW of 143 lbs and has been losing weight since that time. Per weight hx it appears that pt has lost 15 lbs (11% body weight) in 1 month which is significant.  Pt unable to meet needs with NPO status. PTA he was using protein powder because he lifts weights but he has never used nutrition supplements such as Ensure or Boost.  Physical assessment does not show muscle or fat wasting.   Labs and medications reviewed; CBGs: 140-228 mg/dL.  Height:  Ht Readings from Last 1 Encounters:  07/06/14 '6\' 1"'$  (1.854 m)    Weight:  Wt Readings from Last 1 Encounters:  07/06/14 225 lb 5 oz (102.2 kg)    Ideal Body Weight:  83.6 kg (kg)  Wt  Readings from Last 10 Encounters:  07/06/14 225 lb 5 oz (102.2 kg)  07/03/14 233 lb (105.688 kg)  06/15/14 240 lb 4.8 oz (109 kg)  04/19/14 245 lb (111.131 kg)  04/04/14 240 lb (108.863 kg)  03/26/14 245 lb (111.131 kg)  03/13/14 245 lb (111.131 kg)  03/11/14 245 lb (111.131 kg)  07/23/13 251 lb (113.853 kg)  07/05/13 240 lb (108.863 kg)    BMI:  Body mass index is 29.73 kg/(m^2).  Estimated Nutritional Needs:  Kcal:  2000-2200  Protein:  100-115 grams  Fluid:  2L-2.2L/day Skin:  Reviewed, no issues  Diet Order:  Diet NPO time specified  EDUCATION NEEDS:  No education needs identified at this time   Intake/Output Summary (Last 24 hours) at 07/06/14 0945 Last data filed at 07/06/14 0937  Gross per 24 hour  Intake 531.83 ml  Output   2450 ml  Net -1918.17 ml    Last BM:  5/19    Jarome Matin, RD, LDN Inpatient Clinical Dietitian Pager # [phone] After hours/weekend pager # [phone]

## 2014-07-06 NOTE — Progress Notes (Signed)
Comfortable on VM No new complaints  Filed Vitals:   07/06/14 1200 07/06/14 1300 07/06/14 1400 07/06/14 1438  BP: 127/83 138/83 125/78 123/73  Pulse: 91 109 128 127  Temp:    97.7 F (36.5 C)  TempSrc:    Oral  Resp: 23 19  36  Height:    '6\' 1"'$  (1.854 m)  Weight:    101.424 kg (223 lb 9.6 oz)  SpO2: 92% 91% 92% 86%   NAD No JVD Bibasilar crackles Reg, no M NABS, soft Ext warm without edema Neuro without focal deficits  BMET    Component Value Date/Time   NA 136 07/05/2014 0532   K 4.4 07/05/2014 0532   CL 102 07/05/2014 0532   CO2 28 07/05/2014 0532   GLUCOSE 183* 07/05/2014 0532   BUN 26* 07/05/2014 0532   CREATININE 1.18 07/05/2014 0532   CALCIUM 8.4* 07/05/2014 0532   GFRNONAA >60 07/05/2014 0532   GFRAA >60 07/05/2014 0532    CBC    Component Value Date/Time   WBC 8.1 07/05/2014 0532   RBC 4.49 07/05/2014 0532   HGB 12.9* 07/05/2014 0532   HCT 41.0 07/05/2014 0532   PLT 208 07/05/2014 0532   MCV 91.3 07/05/2014 0532   MCH 28.7 07/05/2014 0532   MCHC 31.5 07/05/2014 0532   RDW 13.8 07/05/2014 0532   LYMPHSABS 1.1 07/05/2014 0532   MONOABS 0.5 07/05/2014 0532   EOSABS 0.0 07/05/2014 0532   BASOSABS 0.0 07/05/2014 0532    CXR: NNF  5/20 CT chest: patchy bilateral interstitial and alveolar infiltrates  IMPRESSION: Recurrent hypoxic respiratory failure with pulmonary infiltrates of unclear etiology W/U to date has included CT chest as noted above, PCT which is normal, HIV which is negative Therapeutics to date include a previous course of abx and systemic steroids with which he improved only to relapse as steroids were being tapered  I suspect that this is a noninfectious pneumonitis. He does not currently smoke making DIP and RB-ILD unlikely There might be some underlying chronic fibrotic changes but the time course makes IPF highly unlikely A PIE syndrome (though no peripheral eosinophilia), sarcoidosis and HSP are possibilities Atypical infections  have not been fully ruled out  PLAN: Transfer to med surg Cont supplemental O2 Repeat 2 view CXR 5/22 Change abx to PO levofloxacin only Cont to follow PCT per algorithm/protocol Decrease methylpred to 40 mg IV q 12 hrs Probable FOB 5/23 - favor BAL and TBBX I discussed in detail with pt  Merton Border, MD ; [facility] service Mobile [phone].  After 5:30 PM or weekends, call [phone]

## 2014-07-07 ENCOUNTER — Inpatient Hospital Stay (HOSPITAL_COMMUNITY): Payer: 59

## 2014-07-07 DIAGNOSIS — E440 Moderate protein-calorie malnutrition: Secondary | ICD-10-CM | POA: Diagnosis present

## 2014-07-07 DIAGNOSIS — J189 Pneumonia, unspecified organism: Secondary | ICD-10-CM

## 2014-07-07 DIAGNOSIS — R0602 Shortness of breath: Secondary | ICD-10-CM

## 2014-07-07 LAB — CBC
HCT: 41.5 % (ref 39.0–52.0)
HEMOGLOBIN: 13 g/dL (ref 13.0–17.0)
MCH: 28 pg (ref 26.0–34.0)
MCHC: 31.3 g/dL (ref 30.0–36.0)
MCV: 89.2 fL (ref 78.0–100.0)
Platelets: 200 10*3/uL (ref 150–400)
RBC: 4.65 MIL/uL (ref 4.22–5.81)
RDW: 13.7 % (ref 11.5–15.5)
WBC: 7.8 10*3/uL (ref 4.0–10.5)

## 2014-07-07 LAB — GLUCOSE, CAPILLARY
GLUCOSE-CAPILLARY: 190 mg/dL — AB (ref 65–99)
GLUCOSE-CAPILLARY: 242 mg/dL — AB (ref 65–99)
Glucose-Capillary: 139 mg/dL — ABNORMAL HIGH (ref 65–99)
Glucose-Capillary: 133 mg/dL — ABNORMAL HIGH (ref 65–99)

## 2014-07-07 LAB — BASIC METABOLIC PANEL
Anion gap: 8 (ref 5–15)
BUN: 45 mg/dL — AB (ref 6–20)
CALCIUM: 8.7 mg/dL — AB (ref 8.9–10.3)
CHLORIDE: 98 mmol/L — AB (ref 101–111)
CO2: 29 mmol/L (ref 22–32)
Creatinine, Ser: 1.51 mg/dL — ABNORMAL HIGH (ref 0.61–1.24)
GFR calc Af Amer: 57 mL/min — ABNORMAL LOW (ref >60)
GFR calc non Af Amer: 49 mL/min — ABNORMAL LOW (ref >60)
Glucose, Bld: 215 mg/dL — ABNORMAL HIGH (ref 65–99)
Potassium: 4.4 mmol/L (ref 3.5–5.1)
Sodium: 135 mmol/L (ref 135–145)

## 2014-07-07 LAB — PROCALCITONIN: Procalcitonin: 0.36 ng/mL

## 2014-07-07 NOTE — Progress Notes (Signed)
59 yo AAM with bilateral infiltrates,  R hilar node  Subjective: Comfortable on VM No new complaints. He is aware bronch anticipated. Feels stable day to day, but wonders when he can get off O2.  Filed Vitals:   07/06/14 1438 07/06/14 1700 07/06/14 2105 07/07/14 0505  BP: 123/73 114/70 115/79 121/67  Pulse: 127 129 116 104  Temp: 97.7 F (36.5 C) 97.9 F (36.6 C) 97.3 F (36.3 C) 97.6 F (36.4 C)  TempSrc: Oral Oral Oral Oral  Resp: 36 '22 19 18  '$ Height: '6\' 1"'$  (1.854 m)     Weight: 101.424 kg (223 lb 9.6 oz)     SpO2: 86% 84% 91% 91%   Physical Exam: NAD, oriented and conversational with VM O2 No JVD Bibasilar crackles, esp in bases Reg, no M ABD, soft  Ext warm without edema Neuro without focal deficits  BMET    Component Value Date/Time   NA 135 07/07/2014 0530   K 4.4 07/07/2014 0530   CL 98* 07/07/2014 0530   CO2 29 07/07/2014 0530   GLUCOSE 215* 07/07/2014 0530   BUN 45* 07/07/2014 0530   CREATININE 1.51* 07/07/2014 0530   CALCIUM 8.7* 07/07/2014 0530   GFRNONAA 49* 07/07/2014 0530   GFRAA 57* 07/07/2014 0530    CBC    Component Value Date/Time   WBC 7.8 07/07/2014 0530   RBC 4.65 07/07/2014 0530   HGB 13.0 07/07/2014 0530   HCT 41.5 07/07/2014 0530   PLT 200 07/07/2014 0530   MCV 89.2 07/07/2014 0530   MCH 28.0 07/07/2014 0530   MCHC 31.3 07/07/2014 0530   RDW 13.7 07/07/2014 0530   LYMPHSABS 1.1 07/05/2014 0532   MONOABS 0.5 07/05/2014 0532   EOSABS 0.0 07/05/2014 0532   BASOSABS 0.0 07/05/2014 0532    CXR: NNF  5/20 CT chest: patchy bilateral interstitial and alveolar infiltrates  IMPRESSION: Recurrent hypoxic respiratory failure with pulmonary infiltrates of unclear etiology W/U to date has included CT chest as noted above, PCT which is normal, HIV which is negative Therapeutics to date include a previous course of abx and systemic steroids with which he improved only to relapse as steroids were being tapered  We suspect that this is a  noninfectious pneumonitis. He does not currently smoke making DIP and RB-ILD unlikely There might be some underlying chronic fibrotic changes but the time course makes IPF highly unlikely A PIE syndrome (though no peripheral eosinophilia), sarcoidosis and HSP are possibilities Atypical infections have not been fully ruled out  PLAN: Cont supplemental O2 Repeat 2 view CXR 5/22  PO levofloxacin only Cont to follow PCT per algorithm/protocol  methylpred  40 mg IV q 12 hrs Probable FOB 5/23 - favor BAL and TBBX        discussed in detail with pt ACE level ordered  CD Annamaria Boots, MD, MD ; [facility] service  P [phone].  After 5:30 PM or weekends, call [phone]

## 2014-07-07 NOTE — Progress Notes (Signed)
Pt requested breathing tx. RT assessed- BBS clear, RR-18 and no distress. Breathing tx is not necessary at this time. Pt encouraged to call if respiratory status changes.

## 2014-07-08 ENCOUNTER — Telehealth (HOSPITAL_COMMUNITY): Payer: Self-pay

## 2014-07-08 ENCOUNTER — Encounter (HOSPITAL_COMMUNITY): Payer: 59 | Admitting: Occupational Therapy

## 2014-07-08 DIAGNOSIS — R918 Other nonspecific abnormal finding of lung field: Secondary | ICD-10-CM | POA: Diagnosis present

## 2014-07-08 LAB — LEGIONELLA ANTIGEN, URINE

## 2014-07-08 LAB — ANGIOTENSIN CONVERTING ENZYME: Angiotensin-Converting Enzyme: 32 U/L (ref 14–82)

## 2014-07-08 LAB — GLUCOSE, CAPILLARY
GLUCOSE-CAPILLARY: 187 mg/dL — AB (ref 65–99)
Glucose-Capillary: 134 mg/dL — ABNORMAL HIGH (ref 65–99)
Glucose-Capillary: 126 mg/dL — ABNORMAL HIGH (ref 65–99)
Glucose-Capillary: 176 mg/dL — ABNORMAL HIGH (ref 65–99)

## 2014-07-08 MED ORDER — FLEET ENEMA 7-19 GM/118ML RE ENEM
1.0000 | ENEMA | Freq: Once | RECTAL | Status: DC
  Filled 2014-07-08: qty 1

## 2014-07-08 NOTE — Telephone Encounter (Signed)
Wife said to D/c and cx all future apptments due to patient in hospital

## 2014-07-08 NOTE — Progress Notes (Signed)
Name: [name] MRN: [id number] DOB: Jun 18, 1955    ADMISSION DATE:  07/03/2014  CHIEF COMPLAINT:  Respiratory failure   BRIEF PATIENT DESCRIPTION:  59 yo male heavy smoker with hx HTN, recent hospitalization (d/c 5/8) for PNA, ?OSA and ?potential lung mass.  He was d/c on high flow O2.  Returned 5/20 with fever, elevated BNP and worsening CXR.    SIGNIFICANT EVENTS    STUDIES:  4/28 CT chest >> Rt suprahilar LAN 2.3 cm, Rt hilar LAN 3.3 cm, Lt infrahilar LAN 2.6 cm, interlobular septal thickening and scattered GGO, patchy nodular infiltrates 5/20 CT chest >> progression of bilat patchy opacities with new Bilat lower lobe consolidation with air bronchograms.  NO change R hilar enlargement (?lymphadenopathy v mass).   SUBJECTIVE:  No significant change.  SOB with minimal activity.  Sats 84% on 55% venti mask after bathing.  Still has cough productive of white sputum. Denies chest pain, hemoptysis, edema, orthopnea.   VITAL SIGNS: Temp:  [97.5 F (36.4 C)-98.2 F (36.8 C)] 97.5 F (36.4 C) (05/23 0947) Pulse Rate:  [96-115] 114 (05/23 0947) Resp:  [18-20] 19 (05/23 0947) BP: (116-129)/(68-90) 116/68 mmHg (05/23 0947) SpO2:  [92 %-95 %] 92 % (05/23 0947) FiO2 (%):  [50 %] 50 % (05/22 1053) Weight:  [231 lb 6.4 oz (104.962 kg)] 231 lb 6.4 oz (104.962 kg) (05/22 2121)  PHYSICAL EXAMINATION: General:  Pleasant male, NAD sitting in chair  Neuro:  Awake, alert, appropriate, MAe  HEENT:  Mm dry, venti mask  Cardiovascular:  s1s2 rrr Lungs:  resps even, non labored, mild tachypnea on 55% venti mask, bibasilar crackles no audible wheeze  Abdomen:  Soft, +bs  Musculoskeletal:  Warm and dry, no edema    Recent Labs Lab 07/06/2014 2230 07/05/14 0532 07/07/14 0530  NA 134* 136 135  K 3.9 4.4 4.4  CL 102 102 98*  CO2 '25 28 29  '$ BUN 31* 26* 45*  CREATININE 1.25* 1.18 1.51*  GLUCOSE 141* 183* 215*    Recent Labs Lab 06/28/2014 2230 07/05/14 0532 07/07/14 0530  HGB  12.7* 12.9* 13.0  HCT 39.7 41.0 41.5  WBC 10.4 8.1 7.8  PLT 215 208 200   Dg Chest 2 View  07/07/2014   CLINICAL DATA:  Followup pulmonary infiltrates.  EXAM: CHEST  2 VIEW  COMPARISON:  Chest radiographs dated 06/17/2014 and chest CT dated 07/05/2014.  FINDINGS: No significant change in enlargement of the cardiac silhouette. Mildly decreased patchy opacities in both upper lung zones. Unremarkable bones.  IMPRESSION: Mildly decreased bibasilar pneumonia.   Electronically Signed   By: Claudie Revering M.D.   On: 07/07/2014 09:15    ASSESSMENT / PLAN:  Recurrent hypoxic respiratory failure with pulmonary infiltrates of unclear etiology -- w/u to date has included CT chest as above, normal pct, neg HIV.  Has completed course of broad spectrum and and systemic steroids (seems he did improve with this but quickly relapsed after tapering).   ddx - noninfectious pneumonitis, CAP/HCAP, sarcoidosis, atypical infection.  Previously thought DIP and RB-ILD unlikely since he is not active smoker, but in talking with pt he is a heavy smoker (2-3ppd) and JUST quit r/t this illness.   HTN   PLAN -  Cont supplemental O2 to keep sats >88% Likely needs FOB for BAL and TBBX.  However O2 demands seem to be increasing so may need to hold off.  F/u CXR  Cont levaquin for now  F/u pct - remains normal  ACE  level pending  Cont IV steroids  Pulmonary hygiene  BP control  Consider gentle diuresis    Nickolas Madrid, NP 07/08/2014  10:29 AM Pager: [phone] or [phone]   Reviewed above, examined.  59 yo male was in good health until April 2016.  Since then he has progressive dyspnea and hypoxia.  He was in hospital in April 2016 and tx for PNA.  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  He has prior hx of smoking.  There was concern about lung nodule and possible malignancy.  He is not having much cough.  He denies skin rash,  joint swelling, fever, hemoptysis.    He has basilar predominate crackles, no wheeze.  Heart rate regular, abd soft, no edema, no clubbing.  Will check serologies, f/u CXR, continue Abx, continue solumedrol.    Defer bronchoscopy for now >> if his CXR does not improve, then ? If he might need VATS bx instead.  Chesley Mires, MD [facility] Pulmonary/Critical Care 07/08/2014, 12:22 PM Pager:  [phone] After 3pm call: [phone]

## 2014-07-09 ENCOUNTER — Inpatient Hospital Stay (HOSPITAL_COMMUNITY): Payer: 59

## 2014-07-09 LAB — GLUCOSE, CAPILLARY
GLUCOSE-CAPILLARY: 169 mg/dL — AB (ref 65–99)
GLUCOSE-CAPILLARY: 176 mg/dL — AB (ref 65–99)
Glucose-Capillary: 195 mg/dL — ABNORMAL HIGH (ref 65–99)
Glucose-Capillary: 196 mg/dL — ABNORMAL HIGH (ref 65–99)

## 2014-07-09 LAB — COMPREHENSIVE METABOLIC PANEL
ALK PHOS: 72 U/L (ref 38–126)
ALT: 25 U/L (ref 17–63)
AST: 30 U/L (ref 15–41)
Albumin: 2.7 g/dL — ABNORMAL LOW (ref 3.5–5.0)
Anion gap: 7 (ref 5–15)
BUN: 35 mg/dL — AB (ref 6–20)
CALCIUM: 8.8 mg/dL — AB (ref 8.9–10.3)
CHLORIDE: 99 mmol/L — AB (ref 101–111)
CO2: 26 mmol/L (ref 22–32)
CREATININE: 1.15 mg/dL (ref 0.61–1.24)
GFR calc non Af Amer: >60 mL/min (ref >60)
Glucose, Bld: 234 mg/dL — ABNORMAL HIGH (ref 65–99)
Potassium: 4.8 mmol/L (ref 3.5–5.1)
Sodium: 132 mmol/L — ABNORMAL LOW (ref 135–145)
Total Bilirubin: 0.8 mg/dL (ref 0.3–1.2)
Total Protein: 6.1 g/dL — ABNORMAL LOW (ref 6.5–8.1)

## 2014-07-09 LAB — CBC
HCT: 38.6 % — ABNORMAL LOW (ref 39.0–52.0)
Hemoglobin: 12.1 g/dL — ABNORMAL LOW (ref 13.0–17.0)
MCH: 28.1 pg (ref 26.0–34.0)
MCHC: 31.3 g/dL (ref 30.0–36.0)
MCV: 89.6 fL (ref 78.0–100.0)
Platelets: 159 10*3/uL (ref 150–400)
RBC: 4.31 MIL/uL (ref 4.22–5.81)
RDW: 13.6 % (ref 11.5–15.5)
WBC: 8.3 10*3/uL (ref 4.0–10.5)

## 2014-07-09 LAB — CULTURE, BLOOD (ROUTINE X 2)
Culture: NO GROWTH
Culture: NO GROWTH

## 2014-07-09 LAB — ANCA TITERS
Atypical P-ANCA titer: <1:20 {titer}
P-ANCA: <1:20 {titer}

## 2014-07-09 LAB — RHEUMATOID FACTOR: Rhuematoid fact SerPl-aCnc: 8.8 IU/mL (ref 0.0–13.9)

## 2014-07-09 LAB — PROCALCITONIN: Procalcitonin: <0.1 ng/mL

## 2014-07-09 NOTE — Progress Notes (Signed)
Name: [name] MRN: [id number] DOB: 15-Apr-1955    ADMISSION DATE:  06/16/2014  CHIEF COMPLAINT:  Respiratory failure   BRIEF PATIENT DESCRIPTION:  59 yo male smoker has dyspnea, subjective fever since April 2016 with acute hypoxic respiratory failure and persistent pulmonary infiltrates.   SIGNIFICANT EVENTS  4/28 to 5/08 Admit to [facility] with PNA 5/19 Admit to [facility] 5/20 Transfer to [facility]  STUDIES:  4/28 CT chest >> Rt suprahilar LAN 2.3 cm, Rt hilar LAN 3.3 cm, Lt infrahilar LAN 2.6 cm, interlobular septal thickening and scattered GGO, patchy nodular infiltrates 5/20 CT chest >> progression of bilat patchy opacities with new Bilat lower lobe consolidation with air bronchograms.  NO change R hilar enlargement (?lymphadenopathy v mass).  5/20 Echo >> mild LVH, EF 60 to 65% 5/22 ACE >> 32 5/23 RF >> 8.8  SUBJECTIVE:  He feels his breathing is better at rest.  Still very short of breath with activity.  Denies chest pain or abdominal pain.  VITAL SIGNS: Temp:  [97.7 F (36.5 C)] 97.7 F (36.5 C) (05/24 0955) Pulse Rate:  [97-115] 115 (05/24 0955) Resp:  [19-20] 19 (05/24 0530) BP: (118-136)/(76-91) 136/87 mmHg (05/24 0955) SpO2:  [90 %-97 %] 95 % (05/24 0955) FiO2 (%):  [100 %] 100 % (05/23 2114)  PHYSICAL EXAMINATION: General: speaks in full sentences Neuro: normal strength HEENT: pupils reactive Cardiovascular: regular Lungs: faint crackles, no wheeze  Abdomen:  Soft, non tender Musculoskeletal: no edema Skin: no rashes   CMP Latest Ref Rng 07/09/2014 07/07/2014 07/05/2014  Glucose 65 - 99 mg/dL 234(H) 215(H) 183(H)  BUN 6 - 20 mg/dL 35(H) 45(H) 26(H)  Creatinine 0.61 - 1.24 mg/dL 1.15 1.51(H) 1.18  Sodium 135 - 145 mmol/L 132(L) 135 136  Potassium 3.5 - 5.1 mmol/L 4.8 4.4 4.4  Chloride 101 - 111 mmol/L 99(L) 98(L) 102  CO2 22 - 32 mmol/L '26 29 28  '$ Calcium 8.9 - 10.3 mg/dL 8.8(L) 8.7(L) 8.4(L)  Total Protein 6.5 - 8.1 g/dL 6.1(L) - -  Total Bilirubin 0.3 - 1.2  mg/dL 0.8 - -  Alkaline Phos 38 - 126 U/L 72 - -  AST 15 - 41 U/L 30 - -  ALT 17 - 63 U/L 25 - -    CBC Latest Ref Rng 07/09/2014 07/07/2014 07/05/2014  WBC 4.0 - 10.5 K/uL 8.3 7.8 8.1  Hemoglobin 13.0 - 17.0 g/dL 12.1(L) 13.0 12.9(L)  Hematocrit 39.0 - 52.0 % 38.6(L) 41.5 41.0  Platelets 150 - 400 K/uL 159 200 208    CBG (last 3)   Recent Labs  07/08/14 2127 07/09/14 0738 07/09/14 1143  GLUCAP 176* 195* 169*     Dg Chest 2 View  07/09/2014   CLINICAL DATA:  Shortness of breath and pulmonary edema  EXAM: CHEST  2 VIEW  COMPARISON:  PA and lateral chest of Jul 07, 2014  FINDINGS: The lungs are slightly less well inflated today. There are persistent patchy infiltrates in the mid and lower lung zones. The cardiac silhouette is mildly enlarged. The pulmonary vascularity is prominent centrally. The bony thorax is unremarkable.  IMPRESSION: CHF with pulmonary interstitial edema. Alveolar opacities in the mid and lower lungs may reflect alveolar edema secondary to CHF or could reflect pneumonia. Tiny amounts of pleural fluid are likely present as well. Overall there has been slight interval deterioration in the appearance of the lungs since yesterday's study.   Electronically Signed   By: David  Martinique M.D.   On: 07/09/2014 07:50  ASSESSMENT / PLAN:  Acute hypoxic respiratory failure with persistent pulmonary infiltrates.   Cultures negative, and procalcitonin negative. Plan: - d/c Abx after dose on 5/24 - continue solumedrol 40 mg q12h - oxygen to keep SpO2 90 to 95% - f/u CXR intermittently - defer bronchoscopy for now >> would likely need intubation for procedure - if no further improvement, then ?if he might need VATS lung Bx - f/u ANCA from 5/23  Hx of HTN. Plan: - continue catapres, HCTZ, Avapro  Steroid induced hyperglycemia. Plan: - SSI   Chesley Mires, MD [place] 07/09/2014, 1:03 PM Pager:  [phone] After 3pm call: [phone]

## 2014-07-10 ENCOUNTER — Encounter (HOSPITAL_COMMUNITY): Payer: 59 | Admitting: Occupational Therapy

## 2014-07-10 LAB — BLOOD GAS, ARTERIAL
Acid-Base Excess: 3.4 mmol/L — ABNORMAL HIGH (ref 0.0–2.0)
Bicarbonate: 27.9 mEq/L — ABNORMAL HIGH (ref 20.0–24.0)
Drawn by: 105521
FIO2: 1 %
O2 Saturation: 96.4 %
Patient temperature: 98.6
TCO2: 29.3 mmol/L (ref 0–100)
pCO2 arterial: 46.1 mmHg — ABNORMAL HIGH (ref 35.0–45.0)
pH, Arterial: 7.399 (ref 7.350–7.450)
pO2, Arterial: 89 mmHg (ref 80.0–100.0)

## 2014-07-10 LAB — GLUCOSE, CAPILLARY
GLUCOSE-CAPILLARY: 172 mg/dL — AB (ref 65–99)
GLUCOSE-CAPILLARY: 164 mg/dL — AB (ref 65–99)

## 2014-07-10 LAB — APTT: aPTT: 29 seconds (ref 24–37)

## 2014-07-10 MED ORDER — FLUCONAZOLE 100 MG PO TABS
100.0000 mg | ORAL_TABLET | Freq: Every day | ORAL | Status: DC
  Administered 2014-07-10 – 2014-07-11 (×2): 100 mg via ORAL
  Filled 2014-07-10 (×3): qty 1

## 2014-07-10 MED ORDER — FLUCONAZOLE 100 MG PO TABS
100.0000 mg | ORAL_TABLET | Freq: Every day | ORAL | Status: DC
  Filled 2014-07-10: qty 1

## 2014-07-10 NOTE — Progress Notes (Signed)
Name: [name] MRN: [id number] DOB: [birth year]    ADMISSION DATE:  06/28/2014  CHIEF COMPLAINT:  Respiratory failure   BRIEF PATIENT DESCRIPTION:  59 yo male smoker has dyspnea, subjective fever since April 2016 with acute hypoxic respiratory failure and persistent pulmonary infiltrates.   SIGNIFICANT EVENTS  4/28 to 5/08 Admit to [facility] with PNA 5/19 Admit to [facility] 5/20 Transfer to [facility]  STUDIES:  4/28 CT chest >> Rt suprahilar LAN 2.3 cm, Rt hilar LAN 3.3 cm, Lt infrahilar LAN 2.6 cm, interlobular septal thickening and scattered GGO, patchy nodular infiltrates 5/20 CT chest >> progression of bilat patchy opacities with new Bilat lower lobe consolidation with air bronchograms.  NO change R hilar enlargement (?lymphadenopathy v mass).  5/20 Echo >> mild LVH, EF 60 to 65% 5/22 ACE >> 32 5/23 RF >> 8.8, ANCA negative  SUBJECTIVE:  Concerned about his oxygen needs.  Remains on NRB.  Denies chest pain.  C/o oral thrush.   VITAL SIGNS: Temp:  [97.5 F (36.4 C)-98.1 F (36.7 C)] 98 F (36.7 C) (05/25 0900) Pulse Rate:  [103-110] 110 (05/25 0900) Resp:  [18-20] 18 (05/25 0900) BP: (118-125)/(81-90) 124/81 mmHg (05/25 0900) SpO2:  [90 %-98 %] 98 % (05/25 0900) Weight:  [226 lb 14.4 oz (102.921 kg)] 226 lb 14.4 oz (102.921 kg) (05/24 2026)  PHYSICAL EXAMINATION: General: speaks in full sentences, NAD on NRB Neuro: normal strength HEENT: pupils reactive Cardiovascular: regular Lungs: faint bibasilar crackles, no wheeze, no increased WOB Abdomen:  Soft, non tender Musculoskeletal: no edema Skin: no rashes   CMP Latest Ref Rng 07/09/2014 07/07/2014 07/05/2014  Glucose 65 - 99 mg/dL 234(H) 215(H) 183(H)  BUN 6 - 20 mg/dL 35(H) 45(H) 26(H)  Creatinine 0.61 - 1.24 mg/dL 1.15 1.51(H) 1.18  Sodium 135 - 145 mmol/L 132(L) 135 136  Potassium 3.5 - 5.1 mmol/L 4.8 4.4 4.4  Chloride 101 - 111 mmol/L 99(L) 98(L) 102  CO2 22 - 32 mmol/L '26 29 28  '$ Calcium 8.9 - 10.3 mg/dL 8.8(L)  8.7(L) 8.4(L)  Total Protein 6.5 - 8.1 g/dL 6.1(L) - -  Total Bilirubin 0.3 - 1.2 mg/dL 0.8 - -  Alkaline Phos 38 - 126 U/L 72 - -  AST 15 - 41 U/L 30 - -  ALT 17 - 63 U/L 25 - -    CBC Latest Ref Rng 07/09/2014 07/07/2014 07/05/2014  WBC 4.0 - 10.5 K/uL 8.3 7.8 8.1  Hemoglobin 13.0 - 17.0 g/dL 12.1(L) 13.0 12.9(L)  Hematocrit 39.0 - 52.0 % 38.6(L) 41.5 41.0  Platelets 150 - 400 K/uL 159 200 208    CBG (last 3)   Recent Labs  07/09/14 1143 07/09/14 1707 07/09/14 2129  GLUCAP 169* 176* 196*     Dg Chest 2 View  07/09/2014   CLINICAL DATA:  Shortness of breath and pulmonary edema  EXAM: CHEST  2 VIEW  COMPARISON:  PA and lateral chest of Jul 07, 2014  FINDINGS: The lungs are slightly less well inflated today. There are persistent patchy infiltrates in the mid and lower lung zones. The cardiac silhouette is mildly enlarged. The pulmonary vascularity is prominent centrally. The bony thorax is unremarkable.  IMPRESSION: CHF with pulmonary interstitial edema. Alveolar opacities in the mid and lower lungs may reflect alveolar edema secondary to CHF or could reflect pneumonia. Tiny amounts of pleural fluid are likely present as well. Overall there has been slight interval deterioration in the appearance of the lungs since yesterday's study.   Electronically Signed  By: David  Martinique M.D.   On: 07/09/2014 07:50    ASSESSMENT / PLAN:  Acute hypoxic respiratory failure with persistent pulmonary infiltrates. -- No sig improvement despite steroids, abx, diuresis.  Persistent hypoxia requiring NRB.  Cultures negative, and procalcitonin negative.  ANCA neg 5/24.  Plan: - monitor off abx  - continue solumedrol 40 mg q12h - oxygen to keep SpO2 90 to 95% - f/u CXR intermittently - will consult TCTS for ?VATS bx.  Discussed with patient at length.   Hx of HTN. Plan: - continue catapres, HCTZ, Avapro  Steroid induced hyperglycemia. Plan: - SSI  Oral candidiasis  Plan:  magic mouthwash    Oral diflucan x 7 days    Nickolas Madrid, NP 07/10/2014  10:15 AM Pager: [phone] or [phone]  Reviewed above, examined.  He remains on increased supplemental oxygen.  Serology has been negative.  He has completed course of Abx w/o improvement.  He has been on solumedrol w/o improvement.  He has b/l faint crackles, heart sounds regular, thrush in oropharynx, abdomen soft, no edema.  Will ask TCTS to assess for VATS lung bx.  Will continue solumedrol for now.  Will course of diflucan for thrush.  Chesley Mires, MD [facility] Pulmonary/Critical Care 07/10/2014, 10:28 AM Pager:  [phone] After 3pm call: [phone]

## 2014-07-10 NOTE — Consult Note (Addendum)
   [facility] Inpatient Consult   07/10/2014  [name] 1955/08/09 [id number]   Came to bedside to visit patient as a benefit of Link to [facility] Care Management program for [place] employees/dependents with [facility] insurance. Discussed program for HTN management. [name] endorses he has had HTN for years and it is managed well. Declines having any Link to Wellness needs at this time. However, agreeable to post hospital discharge call. Confirmed telephone number as [phone]. Appreciative of visit. Left contact information and brochure at bedside.  Marthenia Rolling, MSN-Ed, RN,BSN [facility] Liaison [phone]

## 2014-07-11 ENCOUNTER — Inpatient Hospital Stay (HOSPITAL_COMMUNITY): Payer: 59

## 2014-07-11 DIAGNOSIS — R918 Other nonspecific abnormal finding of lung field: Secondary | ICD-10-CM

## 2014-07-11 DIAGNOSIS — I10 Essential (primary) hypertension: Secondary | ICD-10-CM

## 2014-07-11 LAB — URINALYSIS, ROUTINE W REFLEX MICROSCOPIC
Bilirubin Urine: NEGATIVE
Glucose, UA: 100 mg/dL — AB
Ketones, ur: NEGATIVE mg/dL
Leukocytes, UA: NEGATIVE
Nitrite: NEGATIVE
Protein, ur: NEGATIVE mg/dL
Specific Gravity, Urine: 1.024 (ref 1.005–1.030)
Urobilinogen, UA: 0.2 mg/dL (ref 0.0–1.0)
pH: 5 (ref 5.0–8.0)

## 2014-07-11 LAB — GLUCOSE, CAPILLARY
GLUCOSE-CAPILLARY: 205 mg/dL — AB (ref 65–99)
Glucose-Capillary: 132 mg/dL — ABNORMAL HIGH (ref 65–99)
Glucose-Capillary: 294 mg/dL — ABNORMAL HIGH (ref 65–99)
Glucose-Capillary: 227 mg/dL — ABNORMAL HIGH (ref 65–99)

## 2014-07-11 LAB — URINE MICROSCOPIC-ADD ON

## 2014-07-11 LAB — CBC
HCT: 38.9 % — ABNORMAL LOW (ref 39.0–52.0)
Hemoglobin: 12.2 g/dL — ABNORMAL LOW (ref 13.0–17.0)
MCH: 28 pg (ref 26.0–34.0)
MCHC: 31.4 g/dL (ref 30.0–36.0)
MCV: 89.4 fL (ref 78.0–100.0)
Platelets: 157 10*3/uL (ref 150–400)
RBC: 4.35 MIL/uL (ref 4.22–5.81)
RDW: 13.4 % (ref 11.5–15.5)
WBC: 9.3 10*3/uL (ref 4.0–10.5)

## 2014-07-11 LAB — BASIC METABOLIC PANEL
Anion gap: 8 (ref 5–15)
BUN: 34 mg/dL — AB (ref 6–20)
CO2: 27 mmol/L (ref 22–32)
CREATININE: 1.13 mg/dL (ref 0.61–1.24)
Calcium: 8.7 mg/dL — ABNORMAL LOW (ref 8.9–10.3)
Chloride: 96 mmol/L — ABNORMAL LOW (ref 101–111)
GFR calc Af Amer: >60 mL/min (ref >60)
GFR calc non Af Amer: >60 mL/min (ref >60)
Glucose, Bld: 241 mg/dL — ABNORMAL HIGH (ref 65–99)
Potassium: 4.8 mmol/L (ref 3.5–5.1)
Sodium: 131 mmol/L — ABNORMAL LOW (ref 135–145)

## 2014-07-11 LAB — TYPE AND SCREEN
ABO/RH(D): O POS
Antibody Screen: NEGATIVE

## 2014-07-11 LAB — ABO/RH: ABO/RH(D): O POS

## 2014-07-11 LAB — PROTIME-INR
INR: 1.01 (ref 0.00–1.49)
Prothrombin Time: 13.5 seconds (ref 11.6–15.2)

## 2014-07-11 MED ORDER — INSULIN GLARGINE 100 UNIT/ML ~~LOC~~ SOLN
20.0000 [IU] | Freq: Every day | SUBCUTANEOUS | Status: DC
  Administered 2014-07-11: 20 [IU] via SUBCUTANEOUS
  Filled 2014-07-11 (×2): qty 0.2

## 2014-07-11 MED ORDER — DEXTROSE 5 % IV SOLN
1.5000 g | INTRAVENOUS | Status: AC
  Administered 2014-07-12: 1.5 g via INTRAVENOUS
  Filled 2014-07-11: qty 1.5
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
## 2014-07-11 NOTE — Progress Notes (Signed)
Name: [name] MRN: [id number] DOB: 14-Sep-1955    ADMISSION DATE:  06/17/2014  CHIEF COMPLAINT:  Respiratory failure   BRIEF PATIENT DESCRIPTION:  59 yo male smoker has dyspnea, subjective fever since April 2016 with acute hypoxic respiratory failure and persistent pulmonary infiltrates.   SIGNIFICANT EVENTS  4/28 to 5/08 Admit to [facility] with PNA 5/19 Admit to [facility] 5/20 Transfer to [facility]  STUDIES:  4/28 CT chest >> Rt suprahilar LAN 2.3 cm, Rt hilar LAN 3.3 cm, Lt infrahilar LAN 2.6 cm, interlobular septal thickening and scattered GGO, patchy nodular infiltrates 5/20 CT chest >> progression of bilat patchy opacities with new Bilat lower lobe consolidation with air bronchograms.  NO change R hilar enlargement (?lymphadenopathy v mass).  5/20 Echo >> mild LVH, EF 60 to 65% 5/22 ACE >> 32 5/23 RF >> 8.8, ANCA negative  SUBJECTIVE:  Concerned about his oxygen needs.  Remains on NRB.  Denies chest pain.  C/o oral thrush.   VITAL SIGNS: Temp:  [98 F (36.7 C)-98.3 F (36.8 C)] 98 F (36.7 C) (05/26 0940) Pulse Rate:  [102-116] 116 (05/26 0940) Resp:  [18-22] 18 (05/26 0940) BP: (127-143)/(81-91) 127/82 mmHg (05/26 0940) SpO2:  [91 %-98 %] 98 % (05/26 0940)  PHYSICAL EXAMINATION: General: speaks in full sentences, NAD on NRB Neuro: normal strength HEENT: pupils reactive Cardiovascular: regular Lungs: faint bibasilar crackles, no wheeze, no increased WOB Abdomen:  Soft, non tender Musculoskeletal: no edema Skin: no rashes   CMP Latest Ref Rng 07/11/2014 07/09/2014 07/07/2014  Glucose 65 - 99 mg/dL 241(H) 234(H) 215(H)  BUN 6 - 20 mg/dL 34(H) 35(H) 45(H)  Creatinine 0.61 - 1.24 mg/dL 1.13 1.15 1.51(H)  Sodium 135 - 145 mmol/L 131(L) 132(L) 135  Potassium 3.5 - 5.1 mmol/L 4.8 4.8 4.4  Chloride 101 - 111 mmol/L 96(L) 99(L) 98(L)  CO2 22 - 32 mmol/L '27 26 29  '$ Calcium 8.9 - 10.3 mg/dL 8.7(L) 8.8(L) 8.7(L)  Total Protein 6.5 - 8.1 g/dL - 6.1(L) -  Total Bilirubin 0.3 -  1.2 mg/dL - 0.8 -  Alkaline Phos 38 - 126 U/L - 72 -  AST 15 - 41 U/L - 30 -  ALT 17 - 63 U/L - 25 -    CBC Latest Ref Rng 07/11/2014 07/09/2014 07/07/2014  WBC 4.0 - 10.5 K/uL 9.3 8.3 7.8  Hemoglobin 13.0 - 17.0 g/dL 12.2(L) 12.1(L) 13.0  Hematocrit 39.0 - 52.0 % 38.9(L) 38.6(L) 41.5  Platelets 150 - 400 K/uL 157 159 200    CBG (last 3)   Recent Labs  07/10/14 1639 07/10/14 2126 07/11/14 0747  GLUCAP 172* 164* 205*     Dg Chest 2 View  07/11/2014   CLINICAL DATA:  Pneumonia, shortness of Breath  EXAM: CHEST  2 VIEW  COMPARISON:  07/09/2014  FINDINGS: Cardiomediastinal silhouette is stable. Persistent central mild vascular congestion and perihilar interstitial edema. Persistent bilateral basilar atelectasis or infiltrate.  IMPRESSION: No significant change. Stable central mild vascular congestion and mild perihilar interstitial edema. Again noted bilateral basilar atelectasis or infiltrate.   Electronically Signed   By: Lahoma Crocker M.D.   On: 07/11/2014 08:03   I reviewed CXR myself, pulmonary edema and bilateral basilar atelectasis.  ASSESSMENT / PLAN:  Acute hypoxic respiratory failure with persistent pulmonary infiltrates. -- No sig improvement despite steroids, abx, diuresis.  Persistent hypoxia requiring NRB.  Cultures negative, and procalcitonin negative.  ANCA neg 5/24.  Worsening O2 demand.  Plan: - Monitor off abx  - Continue  solumedrol 40 mg q12h - Oxygen to keep SpO2 90 to 95%, currently at 100%, may need BiPAP if continues to worsen. - F/u CXR intermittently - CVTS consult noted to the OR in AM for bronch, VATS and biopsy. - Will likely remain intubated post op, will follow up upon arrival to the ICU.  Hx of HTN. Plan: - Continue catapres, HCTZ, Avapro  Steroid induced hyperglycemia. Plan: - SSI - CBG's  Oral candidiasis  Plan:  Magic mouthwash  Oral diflucan x 7 days   Discussed with consultants to OR in AM.  Rush Farmer, M.D. [facility]  Pulmonary/Critical Care Medicine. Pager: [phone]. After hours pager: [phone].

## 2014-07-12 ENCOUNTER — Inpatient Hospital Stay (HOSPITAL_COMMUNITY): Payer: 59 | Admitting: Anesthesiology

## 2014-07-12 ENCOUNTER — Encounter (HOSPITAL_COMMUNITY): Admission: EM | Disposition: E | Payer: Self-pay | Source: Home / Self Care | Attending: Pulmonary Disease

## 2014-07-12 ENCOUNTER — Encounter (HOSPITAL_COMMUNITY): Payer: 59 | Admitting: Occupational Therapy

## 2014-07-12 ENCOUNTER — Inpatient Hospital Stay (HOSPITAL_COMMUNITY): Payer: 59

## 2014-07-12 DIAGNOSIS — R918 Other nonspecific abnormal finding of lung field: Secondary | ICD-10-CM

## 2014-07-12 HISTORY — PX: VIDEO ASSISTED THORACOSCOPY: SHX5073

## 2014-07-12 HISTORY — PX: VIDEO BRONCHOSCOPY WITH ENDOBRONCHIAL ULTRASOUND: SHX6177

## 2014-07-12 LAB — GLUCOSE, CAPILLARY
GLUCOSE-CAPILLARY: 130 mg/dL — AB (ref 65–99)
Glucose-Capillary: 135 mg/dL — ABNORMAL HIGH (ref 65–99)
Glucose-Capillary: 139 mg/dL — ABNORMAL HIGH (ref 65–99)
Glucose-Capillary: 178 mg/dL — ABNORMAL HIGH (ref 65–99)
Glucose-Capillary: 132 mg/dL — ABNORMAL HIGH (ref 65–99)

## 2014-07-12 LAB — POCT I-STAT 3, ART BLOOD GAS (G3+)
ACID-BASE EXCESS: 4 mmol/L — AB (ref 0.0–2.0)
Bicarbonate: 31.3 mEq/L — ABNORMAL HIGH (ref 20.0–24.0)
O2 Saturation: 95 %
Patient temperature: 97.7
TCO2: 33 mmol/L (ref 0–100)
pCO2 arterial: 56.5 mmHg — ABNORMAL HIGH (ref 35.0–45.0)
pH, Arterial: 7.349 — ABNORMAL LOW (ref 7.350–7.450)
pO2, Arterial: 78 mmHg — ABNORMAL LOW (ref 80.0–100.0)

## 2014-07-12 LAB — SURGICAL PCR SCREEN
MRSA, PCR: NEGATIVE
Staphylococcus aureus: POSITIVE — AB

## 2014-07-12 SURGERY — VIDEO ASSISTED THORACOSCOPY
Anesthesia: General | Site: Chest

## 2014-07-12 MED ORDER — MIDAZOLAM HCL 2 MG/2ML IJ SOLN
INTRAMUSCULAR | Status: AC
  Filled 2014-07-12: qty 2

## 2014-07-12 MED ORDER — FENTANYL CITRATE (PF) 250 MCG/5ML IJ SOLN
INTRAMUSCULAR | Status: AC
  Filled 2014-07-12: qty 5

## 2014-07-12 MED ORDER — SUCCINYLCHOLINE CHLORIDE 20 MG/ML IJ SOLN
INTRAMUSCULAR | Status: AC
  Filled 2014-07-12: qty 1

## 2014-07-12 MED ORDER — FENTANYL CITRATE (PF) 250 MCG/5ML IJ SOLN
INTRAMUSCULAR | Status: DC | PRN
  Administered 2014-07-12 (×2): 150 ug via INTRAVENOUS
  Administered 2014-07-12 (×2): 100 ug via INTRAVENOUS

## 2014-07-12 MED ORDER — ACETAMINOPHEN 500 MG PO TABS
1000.0000 mg | ORAL_TABLET | Freq: Four times a day (QID) | ORAL | Status: DC
  Filled 2014-07-12 (×16): qty 2

## 2014-07-12 MED ORDER — 0.9 % SODIUM CHLORIDE (POUR BTL) OPTIME
TOPICAL | Status: DC | PRN
  Administered 2014-07-12 (×3): 1000 mL

## 2014-07-12 MED ORDER — BISACODYL 5 MG PO TBEC: 10.0000 mg | DELAYED_RELEASE_TABLET | Freq: Every day | ORAL | Status: DC

## 2014-07-12 MED ORDER — PROPOFOL 10 MG/ML IV BOLUS
INTRAVENOUS | Status: DC | PRN
  Administered 2014-07-12: 40 mg via INTRAVENOUS
  Administered 2014-07-12: 50 mg via INTRAVENOUS
  Administered 2014-07-12: 150 mg via INTRAVENOUS

## 2014-07-12 MED ORDER — GLYCOPYRROLATE 0.2 MG/ML IJ SOLN
INTRAMUSCULAR | Status: AC
  Filled 2014-07-12: qty 2

## 2014-07-12 MED ORDER — POTASSIUM CHLORIDE IN NACL 20-0.45 MEQ/L-% IV SOLN
INTRAVENOUS | Status: DC
  Administered 2014-07-12: 100 mL/h via INTRAVENOUS
  Administered 2014-07-12: 23:00:00 via INTRAVENOUS
  Filled 2014-07-12 (×4): qty 1000

## 2014-07-12 MED ORDER — PROPOFOL 10 MG/ML IV BOLUS
INTRAVENOUS | Status: AC
  Filled 2014-07-12: qty 20

## 2014-07-12 MED ORDER — CETYLPYRIDINIUM CHLORIDE 0.05 % MT LIQD
7.0000 mL | Freq: Four times a day (QID) | OROMUCOSAL | Status: DC
  Administered 2014-07-12 – 2014-07-19 (×27): 7 mL via OROMUCOSAL

## 2014-07-12 MED ORDER — ACETAMINOPHEN 160 MG/5ML PO SOLN
1000.0000 mg | Freq: Four times a day (QID) | ORAL | Status: DC
  Administered 2014-07-12 – 2014-07-16 (×11): 1000 mg via ORAL
  Filled 2014-07-12 (×11): qty 40.6

## 2014-07-12 MED ORDER — MIDAZOLAM HCL 2 MG/2ML IJ SOLN
2.0000 mg | INTRAMUSCULAR | Status: DC | PRN
  Administered 2014-07-12 (×2): 2 mg via INTRAVENOUS
  Filled 2014-07-12 (×2): qty 2

## 2014-07-12 MED ORDER — MUPIROCIN 2 % EX OINT
1.0000 "application " | TOPICAL_OINTMENT | Freq: Two times a day (BID) | CUTANEOUS | Status: AC
  Administered 2014-07-12 – 2014-07-16 (×10): 1 via NASAL
  Filled 2014-07-12 (×2): qty 22

## 2014-07-12 MED ORDER — VANCOMYCIN HCL IN DEXTROSE 1-5 GM/200ML-% IV SOLN: 1000.0000 mg | Freq: Two times a day (BID) | INTRAVENOUS | Status: DC

## 2014-07-12 MED ORDER — LEVALBUTEROL HCL 0.63 MG/3ML IN NEBU
0.6300 mg | INHALATION_SOLUTION | Freq: Four times a day (QID) | RESPIRATORY_TRACT | Status: DC
  Administered 2014-07-12 – 2014-07-13 (×4): 0.63 mg via RESPIRATORY_TRACT
  Filled 2014-07-12 (×8): qty 3

## 2014-07-12 MED ORDER — HEMOSTATIC AGENTS (NO CHARGE) OPTIME
TOPICAL | Status: DC | PRN
  Administered 2014-07-12: 1 via TOPICAL

## 2014-07-12 MED ORDER — LIDOCAINE HCL (CARDIAC) 20 MG/ML IV SOLN
INTRAVENOUS | Status: DC | PRN
  Administered 2014-07-12: 40 mg via INTRAVENOUS
  Administered 2014-07-12: 60 mg via INTRAVENOUS

## 2014-07-12 MED ORDER — POTASSIUM CHLORIDE 10 MEQ/50ML IV SOLN: 10.0000 meq | Freq: Every day | INTRAVENOUS | Status: DC | PRN

## 2014-07-12 MED ORDER — VANCOMYCIN HCL IN DEXTROSE 1-5 GM/200ML-% IV SOLN
1000.0000 mg | Freq: Two times a day (BID) | INTRAVENOUS | Status: DC
Start: 2014-07-12 — End: 2014-07-12

## 2014-07-12 MED ORDER — CEFUROXIME SODIUM 1.5 G IJ SOLR
1.5000 g | Freq: Two times a day (BID) | INTRAMUSCULAR | Status: DC
  Administered 2014-07-12 – 2014-07-13 (×2): 1.5 g via INTRAVENOUS
  Filled 2014-07-12 (×4): qty 1.5

## 2014-07-12 MED ORDER — EPHEDRINE SULFATE 50 MG/ML IJ SOLN
INTRAMUSCULAR | Status: AC
  Filled 2014-07-12: qty 1

## 2014-07-12 MED ORDER — INSULIN ASPART 100 UNIT/ML ~~LOC~~ SOLN: 0.0000 [IU] | SUBCUTANEOUS | Status: DC

## 2014-07-12 MED ORDER — DEXMEDETOMIDINE HCL IN NACL 200 MCG/50ML IV SOLN
0.0000 ug/kg/h | INTRAVENOUS | Status: DC
  Administered 2014-07-12 – 2014-07-13 (×7): 0.7 ug/kg/h via INTRAVENOUS
  Filled 2014-07-12 (×7): qty 50

## 2014-07-12 MED ORDER — MIDAZOLAM HCL 2 MG/2ML IJ SOLN: 2.0000 mg | INTRAMUSCULAR | Status: DC | PRN

## 2014-07-12 MED ORDER — INSULIN ASPART 100 UNIT/ML ~~LOC~~ SOLN
0.0000 [IU] | SUBCUTANEOUS | Status: DC
  Administered 2014-07-12: 3 [IU] via SUBCUTANEOUS
  Administered 2014-07-12 – 2014-07-13 (×2): 2 [IU] via SUBCUTANEOUS
  Administered 2014-07-13: 3 [IU] via SUBCUTANEOUS

## 2014-07-12 MED ORDER — ROCURONIUM BROMIDE 50 MG/5ML IV SOLN
INTRAVENOUS | Status: AC
  Filled 2014-07-12: qty 3

## 2014-07-12 MED ORDER — MIDAZOLAM HCL 5 MG/5ML IJ SOLN
INTRAMUSCULAR | Status: DC | PRN
  Administered 2014-07-12: 2 mg via INTRAVENOUS
  Administered 2014-07-12 (×2): 0.5 mg via INTRAVENOUS

## 2014-07-12 MED ORDER — SODIUM CHLORIDE 0.9 % IJ SOLN
INTRAMUSCULAR | Status: AC
  Filled 2014-07-12: qty 10

## 2014-07-12 MED ORDER — LACTATED RINGERS IV SOLN
INTRAVENOUS | Status: DC | PRN
  Administered 2014-07-12 (×2): via INTRAVENOUS

## 2014-07-12 MED ORDER — FENTANYL CITRATE (PF) 100 MCG/2ML IJ SOLN
100.0000 ug | INTRAMUSCULAR | Status: DC | PRN
  Administered 2014-07-12 – 2014-07-13 (×3): 100 ug via INTRAVENOUS
  Filled 2014-07-12 (×4): qty 2

## 2014-07-12 MED ORDER — PANTOPRAZOLE SODIUM 40 MG IV SOLR
40.0000 mg | INTRAVENOUS | Status: DC
  Administered 2014-07-12 – 2014-07-18 (×7): 40 mg via INTRAVENOUS
  Filled 2014-07-12 (×8): qty 40

## 2014-07-12 MED ORDER — ROCURONIUM BROMIDE 100 MG/10ML IV SOLN
INTRAVENOUS | Status: DC | PRN
  Administered 2014-07-12: 40 mg via INTRAVENOUS
  Administered 2014-07-12 (×2): 25 mg via INTRAVENOUS
  Administered 2014-07-12: 50 mg via INTRAVENOUS
  Administered 2014-07-12: 10 mg via INTRAVENOUS

## 2014-07-12 MED ORDER — DEXTROSE 5 % IV SOLN
10.0000 mg | INTRAVENOUS | Status: DC | PRN
  Administered 2014-07-12: 20 ug/min via INTRAVENOUS

## 2014-07-12 MED ORDER — DEXMEDETOMIDINE HCL IN NACL 400 MCG/100ML IV SOLN
0.4000 ug/kg/h | INTRAVENOUS | Status: AC
  Administered 2014-07-12: .7 ug/kg/h via INTRAVENOUS
  Filled 2014-07-12: qty 100

## 2014-07-12 MED ORDER — PHENYLEPHRINE HCL 10 MG/ML IJ SOLN
INTRAMUSCULAR | Status: DC | PRN
  Administered 2014-07-12: 80 ug via INTRAVENOUS

## 2014-07-12 MED ORDER — LIDOCAINE HCL (CARDIAC) 20 MG/ML IV SOLN
INTRAVENOUS | Status: AC
  Filled 2014-07-12: qty 5

## 2014-07-12 MED ORDER — ONDANSETRON HCL 4 MG/2ML IJ SOLN: 4.0000 mg | Freq: Four times a day (QID) | INTRAMUSCULAR | Status: DC | PRN

## 2014-07-12 MED ORDER — FENTANYL CITRATE (PF) 100 MCG/2ML IJ SOLN: 100.0000 ug | INTRAMUSCULAR | Status: DC | PRN

## 2014-07-12 MED ORDER — SENNOSIDES-DOCUSATE SODIUM 8.6-50 MG PO TABS
1.0000 | ORAL_TABLET | Freq: Every day | ORAL | Status: DC
  Administered 2014-07-12 – 2014-07-17 (×5): 1 via ORAL
  Filled 2014-07-12 (×8): qty 1

## 2014-07-12 MED ORDER — METOCLOPRAMIDE HCL 5 MG/ML IJ SOLN
10.0000 mg | Freq: Four times a day (QID) | INTRAMUSCULAR | Status: AC
  Administered 2014-07-12 – 2014-07-13 (×4): 10 mg via INTRAVENOUS
  Filled 2014-07-12 (×4): qty 2

## 2014-07-12 MED ORDER — CHLORHEXIDINE GLUCONATE 0.12 % MT SOLN
15.0000 mL | Freq: Two times a day (BID) | OROMUCOSAL | Status: DC
  Administered 2014-07-12 – 2014-07-19 (×14): 15 mL via OROMUCOSAL
  Filled 2014-07-12 (×14): qty 15

## 2014-07-12 MED ORDER — CHLORHEXIDINE GLUCONATE CLOTH 2 % EX PADS
6.0000 | MEDICATED_PAD | Freq: Every day | CUTANEOUS | Status: DC
  Administered 2014-07-13: 6 via TOPICAL

## 2014-07-12 MED ORDER — PHENYLEPHRINE 40 MCG/ML (10ML) SYRINGE FOR IV PUSH (FOR BLOOD PRESSURE SUPPORT)
PREFILLED_SYRINGE | INTRAVENOUS | Status: AC
  Filled 2014-07-12: qty 10

## 2014-07-12 SURGICAL SUPPLY — 80 items
BAG DECANTER FOR FLEXI CONT (MISCELLANEOUS) IMPLANT
BLADE SURG 11 STRL SS (BLADE) ×5 IMPLANT
BRUSH CYTOL CELLEBRITY 1.5X140 (MISCELLANEOUS) ×5 IMPLANT
CANISTER SUCTION 2500CC (MISCELLANEOUS) ×10 IMPLANT
CATH KIT ON Q 5IN SLV (PAIN MANAGEMENT) IMPLANT
CATH ROBINSON RED A/P 22FR (CATHETERS) IMPLANT
CATH THORACIC 28FR (CATHETERS) ×5 IMPLANT
CATH THORACIC 36FR (CATHETERS) IMPLANT
CATH THORACIC 36FR RT ANG (CATHETERS) IMPLANT
CONT SPEC 4OZ CLIKSEAL STRL BL (MISCELLANEOUS) ×15 IMPLANT
COTTONBALL LRG STERILE PKG (GAUZE/BANDAGES/DRESSINGS) IMPLANT
COVER SURGICAL LIGHT HANDLE (MISCELLANEOUS) ×10 IMPLANT
COVER TABLE BACK 60X90 (DRAPES) ×5 IMPLANT
CUTTER ECHEON FLEX ENDO 45 340 (ENDOMECHANICALS) ×5 IMPLANT
DERMABOND ADVANCED (GAUZE/BANDAGES/DRESSINGS)
DERMABOND ADVANCED .7 DNX12 (GAUZE/BANDAGES/DRESSINGS) IMPLANT
DRAPE LAPAROSCOPIC ABDOMINAL (DRAPES) IMPLANT
DRAPE WARM FLUID 44X44 (DRAPE) ×5 IMPLANT
ELECT BLADE 6.5 EXT (BLADE) ×5 IMPLANT
ELECT REM PT RETURN 9FT ADLT (ELECTROSURGICAL) ×5
ELECTRODE REM PT RTRN 9FT ADLT (ELECTROSURGICAL) ×3 IMPLANT
FORCEPS BIOP RJ4 1.8 (CUTTING FORCEPS) IMPLANT
GAUZE SPONGE 4X4 12PLY STRL (GAUZE/BANDAGES/DRESSINGS) ×5 IMPLANT
GLOVE BIO SURGEON STRL SZ7.5 (GLOVE) ×15 IMPLANT
GOWN STRL REUS W/ TWL LRG LVL3 (GOWN DISPOSABLE) ×9 IMPLANT
GOWN STRL REUS W/TWL LRG LVL3 (GOWN DISPOSABLE) ×6
KIT BASIN OR (CUSTOM PROCEDURE TRAY) ×5 IMPLANT
KIT CLEAN ENDO COMPLIANCE (KITS) ×10 IMPLANT
KIT ROOM TURNOVER OR (KITS) ×10 IMPLANT
KIT SUCTION CATH 14FR (SUCTIONS) ×5 IMPLANT
LIQUID BAND (GAUZE/BANDAGES/DRESSINGS) ×5 IMPLANT
MARKER SKIN DUAL TIP RULER LAB (MISCELLANEOUS) ×10 IMPLANT
NEEDLE 22X1 1/2 (OR ONLY) (NEEDLE) IMPLANT
NEEDLE BIOPSY TRANSBRONCH 21G (NEEDLE) IMPLANT
NEEDLE BLUNT 18X1 FOR OR ONLY (NEEDLE) IMPLANT
NEEDLE SONO TIP II EBUS (NEEDLE) ×5 IMPLANT
NEEDLE SYS SONOTIP II EBUSTBNA (NEEDLE) ×5 IMPLANT
NS IRRIG 1000ML POUR BTL (IV SOLUTION) ×15 IMPLANT
OIL SILICONE PENTAX (PARTS (SERVICE/REPAIRS)) ×5 IMPLANT
PACK CHEST (CUSTOM PROCEDURE TRAY) ×5 IMPLANT
PAD ARMBOARD 7.5X6 YLW CONV (MISCELLANEOUS) ×20 IMPLANT
RELOAD GOLD ECHELON 45 (STAPLE) ×15 IMPLANT
SEALANT SURG COSEAL 4ML (VASCULAR PRODUCTS) ×5 IMPLANT
SLEEVE SURGEON STRL (DRAPES) ×5 IMPLANT
SOLUTION ANTI FOG 6CC (MISCELLANEOUS) ×5 IMPLANT
SPONGE GAUZE 4X4 12PLY STER LF (GAUZE/BANDAGES/DRESSINGS) ×5 IMPLANT
SPONGE TONSIL 1 RF SGL (DISPOSABLE) ×5 IMPLANT
SUT CHROMIC 3 0 SH 27 (SUTURE) IMPLANT
SUT ETHILON 3 0 PS 1 (SUTURE) IMPLANT
SUT PROLENE 3 0 SH DA (SUTURE) IMPLANT
SUT PROLENE 4 0 RB 1 (SUTURE)
SUT PROLENE 4-0 RB1 .5 CRCL 36 (SUTURE) IMPLANT
SUT SILK  1 MH (SUTURE) ×4
SUT SILK 1 MH (SUTURE) ×6 IMPLANT
SUT SILK 2 0SH CR/8 30 (SUTURE) ×5 IMPLANT
SUT SILK 3 0SH CR/8 30 (SUTURE) IMPLANT
SUT VIC AB 1 CTX 18 (SUTURE) IMPLANT
SUT VIC AB 2 TP1 27 (SUTURE) IMPLANT
SUT VIC AB 2-0 CT2 18 VCP726D (SUTURE) IMPLANT
SUT VIC AB 2-0 CTX 36 (SUTURE) IMPLANT
SUT VIC AB 3-0 SH 18 (SUTURE) IMPLANT
SUT VIC AB 3-0 X1 27 (SUTURE) ×10 IMPLANT
SUT VICRYL 0 UR6 27IN ABS (SUTURE) ×10 IMPLANT
SUT VICRYL 2 TP 1 (SUTURE) IMPLANT
SWAB COLLECTION DEVICE MRSA (MISCELLANEOUS) IMPLANT
SYR 20CC LL (SYRINGE) ×5 IMPLANT
SYR 20ML ECCENTRIC (SYRINGE) ×5 IMPLANT
SYR 5ML LUER SLIP (SYRINGE) ×5 IMPLANT
SYR CONTROL 10ML LL (SYRINGE) IMPLANT
SYSTEM SAHARA CHEST DRAIN ATS (WOUND CARE) ×5 IMPLANT
TAPE CLOTH SURG 4X10 WHT LF (GAUZE/BANDAGES/DRESSINGS) ×5 IMPLANT
TIP APPLICATOR SPRAY EXTEND 16 (VASCULAR PRODUCTS) ×5 IMPLANT
TOWEL OR 17X24 6PK STRL BLUE (TOWEL DISPOSABLE) ×5 IMPLANT
TOWEL OR 17X26 10 PK STRL BLUE (TOWEL DISPOSABLE) ×15 IMPLANT
TRAP SPECIMEN MUCOUS 40CC (MISCELLANEOUS) ×5 IMPLANT
TRAY FOLEY CATH 16FRSI W/METER (SET/KITS/TRAYS/PACK) ×5 IMPLANT
TUBE ANAEROBIC SPECIMEN COL (MISCELLANEOUS) IMPLANT
TUBE CONNECTING 20'X1/4 (TUBING) ×2
TUBE CONNECTING 20X1/4 (TUBING) ×8 IMPLANT
WATER STERILE IRR 1000ML POUR (IV SOLUTION) ×10 IMPLANT

## 2014-07-12 NOTE — Brief Op Note (Addendum)
07/02/2014 - 06/25/2014  10:40 AM  PATIENT:  [name]  59 y.o. male  PRE-OPERATIVE DIAGNOSIS:   Pulmonary lung infiltrates, resp failure, mediastinal lymphadenopathy   POST-OPERATIVE DIAGNOSIS:   Pulmonary lung infiltrates, resp failure, mediastinal lymphadenopathy   PROCEDURE:  Procedure(s):  Left VIDEO ASSISTED THORACOSCOPY  -Wedge resection LUL  VIDEO BRONCHOSCOPY WITH ENDOBRONCHIAL ULTRASOUND (N/A)  SURGEON:  Surgeon(s) and Role:    * Ivin Poot, MD - Primary  PHYSICIAN ASSISTANT: Ellwood Handler PA-C  ANESTHESIA:   general  EBL:     BLOOD ADMINISTERED:none  DRAINS: Chest tube left chest   LOCAL MEDICATIONS USED:  NONE  SPECIMEN:  Source of Specimen:  Wedge resection LUL  DISPOSITION OF SPECIMEN:  PATHOLOGY   Quick prep of EBUS node bx- normal lymphocytes Frozen section of LUL bx- pulmonary fibrosis  COUNTS:  YES  TOURNIQUET:  * No tourniquets in log *  DICTATION: .Dragon Dictation  PLAN OF CARE: Admit to inpatient   PATIENT DISPOSITION:  ICU - intubated and hemodynamically stable.   Delay start of Pharmacological VTE agent (>24hrs) due to surgical blood loss or risk of bleeding: yes

## 2014-07-12 NOTE — OR Nursing (Signed)
SICU charge nurse called re 60mn call for arrival to sicu

## 2014-07-12 NOTE — Progress Notes (Signed)
RT Note: Pt transported on vent from OR to unit with no complications. RT will continue to monitor.

## 2014-07-12 NOTE — Progress Notes (Signed)
Nutrition Follow-up  DOCUMENTATION CODES:  Non-severe (moderate) malnutrition in context of acute illness/injury  INTERVENTION:  If TF started, recommend:  Vital AF 1.2 formula -- initiate at 20 ml/hr and increase by 10 ml every 4 hours to goal rate of 70 ml/hr to provide 2016 kcals, 126 gm protein, 1362 ml of free water   NUTRITION DIAGNOSIS:  Inadequate oral intake related to inability to eat as evidenced by NPO status, ongoing  GOAL:  Patient will meet greater than or equal to 90% of their needs, currently unmet  MONITOR:  Vent status, Diet advancement, Labs, Weight trends, I & O's  ASSESSMENT: Per H&P, 59 yo male h/o HTN previously very healthy up until 2 months ago when he was diagnosed with walking PNA. Was treated as outpatient with antibiotics, worsened and presented to the ED over 2 weeks ago when he was hospitalized with acute hypoxid respiratory failure and bilateral PNA.Pt use to smoke 2-3 packs/day cigerettes for about 12 years, has quit due to this illness.  Patient s/p procedure 5/27: LEFT VIDEO ASSISTED THORACOSCOPY WEDGE RESECTION LUL  Patient is currently intubated on ventilator support MV: 6.7 L/min Temp (24hrs), Avg:98.1 F (36.7 C), Min:97.5 F (36.4 C), Max:98.6 F (37 C)   Prior to surgery, patient was on a Carbohydrate Modified diet with variable PO intake of 0-100% per flowsheet records.  Height:  Ht Readings from Last 1 Encounters:  07/06/14 '6\' 1"'$  (1.854 m)    Weight:  Wt Readings from Last 1 Encounters:  06/16/2014 227 lb 4.7 oz (103.1 kg)    Ideal Body Weight:  83.6 kg (kg)  Wt Readings from Last 10 Encounters:  06/25/2014 227 lb 4.7 oz (103.1 kg)  07/03/14 233 lb (105.688 kg)  06/15/14 240 lb 4.8 oz (109 kg)  04/19/14 245 lb (111.131 kg)  04/04/14 240 lb (108.863 kg)  03/26/14 245 lb (111.131 kg)  03/13/14 245 lb (111.131 kg)  03/11/14 245 lb (111.131 kg)  07/23/13 251 lb (113.853 kg)  07/05/13 240 lb (108.863 kg)    BMI:   Body mass index is 29.99 kg/(m^2).  Re-estimated Nutritional Needs:  Kcal:  1998  Protein:  120-130 gm  Fluid:  per MD  Skin:  Reviewed, no issues  Diet Order:  Diet NPO time specified  EDUCATION NEEDS:  No education needs identified at this time   Intake/Output Summary (Last 24 hours) at 07/08/2014 1500 Last data filed at 07/01/2014 1400  Gross per 24 hour  Intake   2050 ml  Output    873 ml  Net   1177 ml    Last BM:  5/25  Chai Holms, RD, LDN Pager #: [phone] After-Hours Pager #: [phone]

## 2014-07-12 NOTE — Progress Notes (Signed)
CT surgery p.m. Rounds  Status post left VATS and lung biopsy Patient with interstitial pulmonary fibrosis Oxygen wean from 100% to 70% Remains sedated on ventilator-wean per critical care  No air leak minimal chest tube drainage

## 2014-07-12 NOTE — Transfer of Care (Signed)
Immediate Anesthesia Transfer of Care Note  Patient: [name]  Procedure(s) Performed: Procedure(s): Left VIDEO ASSISTED THORACOSCOPY with wedge resection (Left) VIDEO BRONCHOSCOPY WITH ENDOBRONCHIAL ULTRASOUND (N/A)  Patient Location: ICU  Anesthesia Type:General  Level of Consciousness: sedated and unresponsive  Airway & Oxygen Therapy: Patient remains intubated per anesthesia plan and Patient placed on Ventilator (see vital sign flow sheet for setting)  Post-op Assessment: Post -op Vital signs reviewed and stable  Post vital signs: Reviewed and stable  Last Vitals:  Filed Vitals:   06/26/2014 0540  BP: 117/82  Pulse: 100  Temp: 36.4 C  Resp: 18    Complications: No apparent anesthesia complications

## 2014-07-12 NOTE — Progress Notes (Signed)
[facility] Progress Note Patient Name: [name] DOB: 26-Mar-1955 MRN: [id number]   Date of Service  07/08/2014  HPI/Events of Note  Ventilated patient. Needs Stress Ulcer Prophylaxis.   eICU Interventions  Will order Protonix IV.     Intervention Category Intermediate Interventions: Best-practice therapies (e.g. DVT, beta blocker, etc.)  Sommer,Steven Eugene 07/03/2014, 5:03 PM

## 2014-07-12 NOTE — Progress Notes (Signed)
[facility] Progress Note Patient Name: [name] DOB: 12-20-1955 MRN: [id number]   Date of Service  07/07/2014  HPI/Events of Note  Patient is NPO and is on AC/HS insulin coverage + Lantus   eICU Interventions  Will order: 1. Moderate Novolog SSI Q 4 hours. 2. D/C Lantus for now.      Intervention Category Intermediate Interventions: Hyperglycemia - evaluation and treatment  Gisela Lea Cornelia Copa 06/29/2014, 5:50 PM

## 2014-07-12 NOTE — Anesthesia Procedure Notes (Addendum)
Procedure Name: Intubation Date/Time: 07/07/2014 7:42 AM Performed by: Tamala Fothergill S Patient Re-evaluated:Patient Re-evaluated prior to inductionPreoxygenation: Pre-oxygenation with 100% oxygen Intubation Type: IV induction Ventilation: Oral airway inserted - appropriate to patient size and Two handed mask ventilation required Laryngoscope Size: Miller and 3 Tube type: Oral Tube size: 8.5 mm Placement Confirmation: ETT inserted through vocal cords under direct vision,  breath sounds checked- equal and bilateral and positive ETCO2 Secured at: 23 cm Tube secured with: Tape Dental Injury: Teeth and Oropharynx as per pre-operative assessment     Procedure Name: Intubation Date/Time: 07/13/2014 9:10 AM Performed by: Mariea Clonts Laryngoscope Size: Miller and 3 Grade View: Grade I Endobronchial tube: Double lumen EBT, Left, EBT position confirmed by auscultation and EBT position confirmed by fiberoptic bronchoscope and 41 Fr Number of attempts: 1 Placement Confirmation: breath sounds checked- equal and bilateral,  ETT inserted through vocal cords under direct vision and positive ETCO2 Tube secured with: Tape Dental Injury: Teeth and Oropharynx as per pre-operative assessment     Procedure Name: Intubation Date/Time: 07/08/2014 10:55 AM Performed by: Mariea Clonts Laryngoscope Size: Miller and 3 Grade View: Grade I Tube type: Subglottic suction tube Tube size: 8.0 mm Number of attempts: 1 Placement Confirmation: ETT inserted through vocal cords under direct vision,  breath sounds checked- equal and bilateral and positive ETCO2 Tube secured with: Tape Dental Injury: Teeth and Oropharynx as per pre-operative assessment    Left IJ CVP Dual Lumen: [id number]: The patient was identified and consent obtained.  TO was performed, and full barrier precautions were used.  The skin was anesthetized with lidocaine.  Once the vein was located with the 22 ga. needle using ultrasound  guidance , the wire was inserted into the vein.  The wire location was confirmed with ultrasound.  The tissue was dilated and the catheter was carefully inserted, then sutured in place. A dressing was applied. The patient tolerated the procedure well.  CXR was ordered for PACU. CE

## 2014-07-12 NOTE — Progress Notes (Signed)
The patient was examined and preop studies reviewed. There has been no change from the prior exam and the patient is ready for surgery.   Plan bronch, EBUS, Left lung biopsy on A Malen Gauze

## 2014-07-12 NOTE — Progress Notes (Signed)
Name: [name] MRN: [id number] DOB: [birth year]    ADMISSION DATE:  06/19/2014  CHIEF COMPLAINT:  Respiratory failure   BRIEF PATIENT DESCRIPTION:  59 yo male smoker has dyspnea, subjective fever since April 2016 with acute hypoxic respiratory failure and persistent pulmonary infiltrates.   SIGNIFICANT EVENTS  4/28 to 5/08 Admit to [facility] with PNA 5/19 Admit to [facility] 5/20 Transfer to [facility] 5/27 s/p VATS  STUDIES:  4/28 CT chest >> Rt suprahilar LAN 2.3 cm, Rt hilar LAN 3.3 cm, Lt infrahilar LAN 2.6 cm, interlobular septal thickening and scattered GGO, patchy nodular infiltrates 5/20 CT chest >> progression of bilat patchy opacities with new Bilat lower lobe consolidation with air bronchograms.  NO change R hilar enlargement (?lymphadenopathy v mass).  5/20 Echo >> mild LVH, EF 60 to 65% 5/22 ACE >> 32 5/23 RF >> 8.8, ANCA negative  SUBJECTIVE:  Had VATs this AM.  Remained on vent post op.  VITAL SIGNS: Temp:  [97.5 F (36.4 C)-98.6 F (37 C)] 97.7 F (36.5 C) (05/27 1120) Pulse Rate:  [77-118] 77 (05/27 1130) Resp:  [0-28] 0 (05/27 1130) BP: (107-139)/(63-82) 107/63 mmHg (05/27 1110) SpO2:  [92 %-97 %] 97 % (05/27 1130) Arterial Line BP: (130-132)/(74-86) 130/74 mmHg (05/27 1130) FiO2 (%):  [100 %] 100 % (05/27 1110) Weight:  [227 lb 4.7 oz (103.1 kg)] 227 lb 4.7 oz (103.1 kg) (05/27 0540)  VENT SETTINGS: Vent Mode:  [-] SIMV;PRVC FiO2 (%):  [100 %] 100 % Set Rate:  [12 bmp] 12 bmp Vt Set:  [550 mL] 550 mL PEEP:  [5 cmH20] 5 cmH20 Pressure Support:  [10 cmH20] 10 cmH20 Plateau Pressure:  [23 cmH20] 23 cmH20  PHYSICAL EXAMINATION: General: sedated Neuro: normal strength HEENT: ETT in place Cardiovascular: regular Lungs: faint bibasilar crackles, no wheeze, chest tubes in place Abdomen:  Soft, non tender Musculoskeletal: no edema Skin: no rashes   CMP Latest Ref Rng 07/11/2014 07/09/2014 07/07/2014  Glucose 65 - 99 mg/dL 241(H) 234(H) 215(H)  BUN 6 - 20  mg/dL 34(H) 35(H) 45(H)  Creatinine 0.61 - 1.24 mg/dL 1.13 1.15 1.51(H)  Sodium 135 - 145 mmol/L 131(L) 132(L) 135  Potassium 3.5 - 5.1 mmol/L 4.8 4.8 4.4  Chloride 101 - 111 mmol/L 96(L) 99(L) 98(L)  CO2 22 - 32 mmol/L '27 26 29  '$ Calcium 8.9 - 10.3 mg/dL 8.7(L) 8.8(L) 8.7(L)  Total Protein 6.5 - 8.1 g/dL - 6.1(L) -  Total Bilirubin 0.3 - 1.2 mg/dL - 0.8 -  Alkaline Phos 38 - 126 U/L - 72 -  AST 15 - 41 U/L - 30 -  ALT 17 - 63 U/L - 25 -    CBC Latest Ref Rng 07/11/2014 07/09/2014 07/07/2014  WBC 4.0 - 10.5 K/uL 9.3 8.3 7.8  Hemoglobin 13.0 - 17.0 g/dL 12.2(L) 12.1(L) 13.0  Hematocrit 39.0 - 52.0 % 38.9(L) 38.6(L) 41.5  Platelets 150 - 400 K/uL 157 159 200    CBG (last 3)   Recent Labs  07/11/14 1141 07/11/14 1712 07/11/14 2157  GLUCAP 132* 294* 227*     Dg Chest 2 View  07/11/2014   CLINICAL DATA:  Pneumonia, shortness of Breath  EXAM: CHEST  2 VIEW  COMPARISON:  07/09/2014  FINDINGS: Cardiomediastinal silhouette is stable. Persistent central mild vascular congestion and perihilar interstitial edema. Persistent bilateral basilar atelectasis or infiltrate.  IMPRESSION: No significant change. Stable central mild vascular congestion and mild perihilar interstitial edema. Again noted bilateral basilar atelectasis or infiltrate.   Electronically Signed  By: Lahoma Crocker M.D.   On: 07/11/2014 08:03   Dg Chest Port 1 View  06/24/2014   CLINICAL DATA:  Left-sided fluoroscopy wedge resection.  Postop.  EXAM: PORTABLE CHEST - 1 VIEW  COMPARISON:  1 day prior are  FINDINGS: Endotracheal tube terminates 2.7 cm above carina. Nasogastric tube extends beyond the inferior aspect of the thumb. Left internal jugular line terminates at the high SVC.  A left chest tube is new. Cardiomegaly accentuated by AP portable technique. Small bilateral pleural effusions are similar. No pneumothorax. Patchy bibasilar airspace disease is not significantly changed.  IMPRESSION: Appropriate position of support  apparatus, including endotracheal tube 2.7 cm above carina.  No change in bibasilar airspace disease with probable small bilateral pleural effusions.  No pneumothorax.  Report called to operating room 11:37 a.m.   Electronically Signed   By: Abigail Miyamoto M.D.   On: 07/11/2014 11:38     ASSESSMENT / PLAN:  PULMONARY A: Acute hypoxic respiratory failure with persistent pulmonary infiltrates. P: F/u VATS bx results Full vent support F/u CXR Continue solumedrol Post op care per TCTS  CARDIAC A: Hx of HTN. P: Monitor hemodynamics  RENAL A: Mild hyponatremia. P: F/u BMET  GASTRO A: Nutrition. P: NPO Protonix Tube feeds if unable to extubate soon  HEME A: No acute issues. P: F/u CBC  INFECTION A: ?PNA >> complete Abx. Oral candidiasis. P: Monitor off Abx Continue diflucan  ENDOCRINE A: Steroid induced hyperglycemia. P: SSI  NEURO A: Sedation. P: RASS goal -1  CC timd 35 minutes.  Chesley Mires, MD [facility] Pulmonary/Critical Care 07/16/2014, 12:02 PM Pager:  [phone] After 3pm call: [phone]

## 2014-07-12 NOTE — Anesthesia Postprocedure Evaluation (Signed)
  Anesthesia Post-op Note  Patient: [name]  Procedure(s) Performed: Procedure(s): Left VIDEO ASSISTED THORACOSCOPY with wedge resection (Left) VIDEO BRONCHOSCOPY WITH ENDOBRONCHIAL ULTRASOUND (N/A)  Patient Location: PACU and SICU  Anesthesia Type:General  Level of Consciousness: Patient remains intubated per anesthesia plan  Airway and Oxygen Therapy: Patient remains intubated per anesthesia plan  Post-op Pain: none  Post-op Assessment: Post-op Vital signs reviewed  Post-op Vital Signs: Reviewed  Last Vitals:  Filed Vitals:   06/29/2014 1640  BP: 120/85  Pulse: 70  Temp: 35.6 C  Resp: 20    Complications: No apparent anesthesia complications

## 2014-07-12 NOTE — Anesthesia Preprocedure Evaluation (Addendum)
Anesthesia Evaluation  Patient identified by MRN, date of birth, ID band Patient awake    Reviewed: Allergy & Precautions, NPO status , Patient's Chart, lab work & pertinent test results  Airway Mallampati: II  TM Distance: >3 FB Neck ROM: Full    Dental   Pulmonary shortness of breath, pneumonia -, former smoker,    + decreased breath sounds      Cardiovascular hypertension, Rhythm:Regular Rate:Normal     Neuro/Psych    GI/Hepatic negative GI ROS, Neg liver ROS,   Endo/Other    Renal/GU Renal disease     Musculoskeletal   Abdominal   Peds  Hematology   Anesthesia Other Findings   Reproductive/Obstetrics                            Anesthesia Physical Anesthesia Plan  ASA: III  Anesthesia Plan: General   Post-op Pain Management:    Induction: Intravenous  Airway Management Planned: Oral ETT  Additional Equipment: Arterial line and CVP  Intra-op Plan:   Post-operative Plan: Possible Post-op intubation/ventilation  Informed Consent: I have reviewed the patients History and Physical, chart, labs and discussed the procedure including the risks, benefits and alternatives for the proposed anesthesia with the patient or authorized representative who has indicated his/her understanding and acceptance.   Dental advisory given  Plan Discussed with: CRNA and Anesthesiologist  Anesthesia Plan Comments:         Anesthesia Quick Evaluation

## 2014-07-13 ENCOUNTER — Inpatient Hospital Stay (HOSPITAL_COMMUNITY): Payer: 59

## 2014-07-13 ENCOUNTER — Encounter (HOSPITAL_COMMUNITY): Payer: Self-pay | Admitting: Cardiothoracic Surgery

## 2014-07-13 DIAGNOSIS — J80 Acute respiratory distress syndrome: Principal | ICD-10-CM

## 2014-07-13 DIAGNOSIS — N189 Chronic kidney disease, unspecified: Secondary | ICD-10-CM

## 2014-07-13 LAB — CBC
HCT: 37.2 % — ABNORMAL LOW (ref 39.0–52.0)
Hemoglobin: 11.9 g/dL — ABNORMAL LOW (ref 13.0–17.0)
MCH: 28.5 pg (ref 26.0–34.0)
MCHC: 32 g/dL (ref 30.0–36.0)
MCV: 89.2 fL (ref 78.0–100.0)
Platelets: 131 K/uL — ABNORMAL LOW (ref 150–400)
RBC: 4.17 MIL/uL — ABNORMAL LOW (ref 4.22–5.81)
RDW: 13.5 % (ref 11.5–15.5)
WBC: 8.2 K/uL (ref 4.0–10.5)

## 2014-07-13 LAB — BLOOD GAS, ARTERIAL
Acid-Base Excess: 0.5 mmol/L (ref 0.0–2.0)
Bicarbonate: 25.1 meq/L — ABNORMAL HIGH (ref 20.0–24.0)
Drawn by: 34762
FIO2: 0.9 %
MECHVT: 550 mL
O2 Saturation: 88.6 %
PEEP: 5 cmH2O
Patient temperature: 98
RATE: 18 {breaths}/min
TCO2: 26.5 mmol/L (ref 0–100)
pCO2 arterial: 43.3 mmHg (ref 35.0–45.0)
pH, Arterial: 7.379 (ref 7.350–7.450)
pO2, Arterial: 61.4 mmHg — ABNORMAL LOW (ref 80.0–100.0)

## 2014-07-13 LAB — POCT I-STAT 3, ART BLOOD GAS (G3+)
ACID-BASE EXCESS: 1 mmol/L (ref 0.0–2.0)
ACID-BASE EXCESS: 1 mmol/L (ref 0.0–2.0)
Acid-Base Excess: 2 mmol/L (ref 0.0–2.0)
BICARBONATE: 33.8 meq/L — AB (ref 20.0–24.0)
Bicarbonate: 27.5 mEq/L — ABNORMAL HIGH (ref 20.0–24.0)
Bicarbonate: 31 mEq/L — ABNORMAL HIGH (ref 20.0–24.0)
Bicarbonate: 31.8 mEq/L — ABNORMAL HIGH (ref 20.0–24.0)
Bicarbonate: 30.6 mEq/L — ABNORMAL HIGH (ref 20.0–24.0)
Bicarbonate: 32.9 mEq/L — ABNORMAL HIGH (ref 20.0–24.0)
O2 SAT: 85 %
O2 SAT: 74 %
O2 Saturation: 88 %
O2 Saturation: 94 %
O2 Saturation: 82 %
O2 Saturation: 84 %
PH ART: 7.203 — AB (ref 7.350–7.450)
PH ART: 7.206 — AB (ref 7.350–7.450)
PO2 ART: 66 mmHg — AB (ref 80.0–100.0)
PO2 ART: 59 mmHg — AB (ref 80.0–100.0)
Patient temperature: 97.3
Patient temperature: 98.6
Patient temperature: 98.6
TCO2: 29 mmol/L (ref 0–100)
TCO2: 33 mmol/L (ref 0–100)
TCO2: 37 mmol/L (ref 0–100)
TCO2: 34 mmol/L (ref 0–100)
TCO2: 33 mmol/L (ref 0–100)
TCO2: 35 mmol/L (ref 0–100)
pCO2 arterial: 49.1 mmHg — ABNORMAL HIGH (ref 35.0–45.0)
pCO2 arterial: 78.7 mmHg (ref 35.0–45.0)
pCO2 arterial: 91.3 mmHg (ref 35.0–45.0)
pCO2 arterial: 83.5 mmHg (ref 35.0–45.0)
pCO2 arterial: 80.1 mmHg (ref 35.0–45.0)
pCO2 arterial: 82.9 mmHg (ref 35.0–45.0)
pH, Arterial: 7.352 (ref 7.350–7.450)
pH, Arterial: 7.176 — CL (ref 7.350–7.450)
pH, Arterial: 7.188 — CL (ref 7.350–7.450)
pH, Arterial: 7.19 — CL (ref 7.350–7.450)
pO2, Arterial: 55 mmHg — ABNORMAL LOW (ref 80.0–100.0)
pO2, Arterial: 92 mmHg (ref 80.0–100.0)
pO2, Arterial: 49 mmHg — ABNORMAL LOW (ref 80.0–100.0)
pO2, Arterial: 62 mmHg — ABNORMAL LOW (ref 80.0–100.0)

## 2014-07-13 LAB — BASIC METABOLIC PANEL
ANION GAP: 7 (ref 5–15)
BUN: 41 mg/dL — ABNORMAL HIGH (ref 6–20)
CO2: 26 mmol/L (ref 22–32)
CREATININE: 0.99 mg/dL (ref 0.61–1.24)
Calcium: 8.1 mg/dL — ABNORMAL LOW (ref 8.9–10.3)
Chloride: 100 mmol/L — ABNORMAL LOW (ref 101–111)
GLUCOSE: 188 mg/dL — AB (ref 65–99)
Potassium: 5.1 mmol/L (ref 3.5–5.1)
Sodium: 133 mmol/L — ABNORMAL LOW (ref 135–145)

## 2014-07-13 LAB — GLUCOSE, CAPILLARY
GLUCOSE-CAPILLARY: 160 mg/dL — AB (ref 65–99)
Glucose-Capillary: 137 mg/dL — ABNORMAL HIGH (ref 65–99)
Glucose-Capillary: 145 mg/dL — ABNORMAL HIGH (ref 65–99)
Glucose-Capillary: 167 mg/dL — ABNORMAL HIGH (ref 65–99)
Glucose-Capillary: 171 mg/dL — ABNORMAL HIGH (ref 65–99)
Glucose-Capillary: 271 mg/dL — ABNORMAL HIGH (ref 65–99)

## 2014-07-13 LAB — POCT I-STAT, CHEM 8
BUN: 45 mg/dL — AB (ref 6–20)
CALCIUM ION: 1.19 mmol/L (ref 1.12–1.23)
Chloride: 98 mmol/L — ABNORMAL LOW (ref 101–111)
Creatinine, Ser: 1.2 mg/dL (ref 0.61–1.24)
GLUCOSE: 186 mg/dL — AB (ref 65–99)
HCT: 49 % (ref 39.0–52.0)
Hemoglobin: 16.7 g/dL (ref 13.0–17.0)
POTASSIUM: 5.4 mmol/L — AB (ref 3.5–5.1)
Sodium: 133 mmol/L — ABNORMAL LOW (ref 135–145)
TCO2: 30 mmol/L (ref 0–100)

## 2014-07-13 MED ORDER — METHYLPREDNISOLONE SODIUM SUCC 125 MG IJ SOLR
80.0000 mg | Freq: Two times a day (BID) | INTRAMUSCULAR | Status: DC
  Administered 2014-07-13: 250 mg via INTRAVENOUS

## 2014-07-13 MED ORDER — FUROSEMIDE 10 MG/ML IJ SOLN
40.0000 mg | Freq: Four times a day (QID) | INTRAMUSCULAR | Status: AC
  Administered 2014-07-13 (×2): 40 mg via INTRAVENOUS
  Filled 2014-07-13 (×2): qty 4

## 2014-07-13 MED ORDER — SODIUM CHLORIDE 0.9 % IV SOLN
3.0000 ug/kg/min | INTRAVENOUS | Status: DC
  Administered 2014-07-13: 3 ug/kg/min via INTRAVENOUS
  Administered 2014-07-14: 4 ug/kg/min via INTRAVENOUS
  Administered 2014-07-14: 3 ug/kg/min via INTRAVENOUS
  Administered 2014-07-15: 4 ug/kg/min via INTRAVENOUS
  Administered 2014-07-15: 3.5 ug/kg/min via INTRAVENOUS
  Administered 2014-07-16: 4 ug/kg/min via INTRAVENOUS
  Administered 2014-07-17: 3.5 ug/kg/min via INTRAVENOUS
  Administered 2014-07-17: 3 ug/kg/min via INTRAVENOUS
  Administered 2014-07-18 (×2): 3.5 ug/kg/min via INTRAVENOUS
  Administered 2014-07-18: 3 ug/kg/min via INTRAVENOUS
  Administered 2014-07-19: 3.5 ug/kg/min via INTRAVENOUS
  Filled 2014-07-13 (×15): qty 20

## 2014-07-13 MED ORDER — METOPROLOL TARTRATE 1 MG/ML IV SOLN
2.5000 mg | INTRAVENOUS | Status: DC | PRN
  Administered 2014-07-16 (×2): 2.5 mg via INTRAVENOUS
  Filled 2014-07-13 (×4): qty 5

## 2014-07-13 MED ORDER — METOPROLOL TARTRATE 1 MG/ML IV SOLN
INTRAVENOUS | Status: AC
Start: 2014-07-13 — End: 2014-07-13
  Administered 2014-07-13: 5 mg via INTRAVENOUS
  Filled 2014-07-13: qty 5

## 2014-07-13 MED ORDER — METHYLPREDNISOLONE SODIUM SUCC 1000 MG IJ SOLR
250.0000 mg | Freq: Two times a day (BID) | INTRAMUSCULAR | Status: DC
  Administered 2014-07-14 – 2014-07-16 (×5): 250 mg via INTRAVENOUS
  Filled 2014-07-13 (×7): qty 2

## 2014-07-13 MED ORDER — SODIUM CHLORIDE 0.9 % IV SOLN
25.0000 ug/h | INTRAVENOUS | Status: DC
  Administered 2014-07-13: 50 ug/h via INTRAVENOUS
  Administered 2014-07-13: 250 ug/h via INTRAVENOUS
  Administered 2014-07-14: 300 ug/h via INTRAVENOUS
  Administered 2014-07-14: 350 ug/h via INTRAVENOUS
  Administered 2014-07-14 – 2014-07-15 (×4): 300 ug/h via INTRAVENOUS
  Administered 2014-07-16: 250 ug/h via INTRAVENOUS
  Administered 2014-07-16: 300 ug/h via INTRAVENOUS
  Administered 2014-07-17 – 2014-07-19 (×5): 250 ug/h via INTRAVENOUS
  Filled 2014-07-13 (×16): qty 50

## 2014-07-13 MED ORDER — DOCUSATE SODIUM 50 MG/5ML PO LIQD
100.0000 mg | Freq: Two times a day (BID) | ORAL | Status: DC | PRN
  Administered 2014-07-13: 100 mg
  Filled 2014-07-13 (×2): qty 10

## 2014-07-13 MED ORDER — METOPROLOL TARTRATE 1 MG/ML IV SOLN
5.0000 mg | Freq: Four times a day (QID) | INTRAVENOUS | Status: DC
  Administered 2014-07-13 – 2014-07-15 (×5): 5 mg via INTRAVENOUS
  Filled 2014-07-13 (×12): qty 5

## 2014-07-13 MED ORDER — INSULIN ASPART 100 UNIT/ML ~~LOC~~ SOLN
2.0000 [IU] | SUBCUTANEOUS | Status: DC
  Administered 2014-07-13: 4 [IU] via SUBCUTANEOUS
  Administered 2014-07-13: 6 [IU] via SUBCUTANEOUS
  Administered 2014-07-13: 2 [IU] via SUBCUTANEOUS
  Administered 2014-07-13: 6 [IU] via SUBCUTANEOUS
  Administered 2014-07-14: 4 [IU] via SUBCUTANEOUS
  Administered 2014-07-14 (×2): 6 [IU] via SUBCUTANEOUS
  Administered 2014-07-14: 2 [IU] via SUBCUTANEOUS
  Administered 2014-07-15 (×2): 6 [IU] via SUBCUTANEOUS
  Administered 2014-07-15 (×5): 4 [IU] via SUBCUTANEOUS

## 2014-07-13 MED ORDER — IPRATROPIUM-ALBUTEROL 0.5-2.5 (3) MG/3ML IN SOLN
3.0000 mL | Freq: Four times a day (QID) | RESPIRATORY_TRACT | Status: DC
  Filled 2014-07-13: qty 3

## 2014-07-13 MED ORDER — MIDAZOLAM BOLUS VIA INFUSION
2.0000 mg | INTRAVENOUS | Status: DC | PRN
  Administered 2014-07-13: 1 mg via INTRAVENOUS
  Filled 2014-07-13 (×2): qty 2

## 2014-07-13 MED ORDER — VANCOMYCIN HCL 10 G IV SOLR
2000.0000 mg | Freq: Once | INTRAVENOUS | Status: AC
  Administered 2014-07-13: 2000 mg via INTRAVENOUS
  Filled 2014-07-13: qty 2000

## 2014-07-13 MED ORDER — CEFEPIME HCL 1 G IJ SOLR
1.0000 g | Freq: Three times a day (TID) | INTRAMUSCULAR | Status: DC
  Administered 2014-07-13 – 2014-07-18 (×15): 1 g via INTRAVENOUS
  Filled 2014-07-13 (×16): qty 1

## 2014-07-13 MED ORDER — FUROSEMIDE 10 MG/ML IJ SOLN
40.0000 mg | INTRAMUSCULAR | Status: AC
  Administered 2014-07-13 (×2): 40 mg via INTRAVENOUS
  Filled 2014-07-13 (×2): qty 4

## 2014-07-13 MED ORDER — PHENYLEPHRINE HCL 10 MG/ML IJ SOLN
0.0000 ug/min | INTRAMUSCULAR | Status: DC
  Administered 2014-07-13: 10 ug/min via INTRAVENOUS
  Administered 2014-07-14: 50 ug/min via INTRAVENOUS
  Filled 2014-07-13 (×3): qty 1

## 2014-07-13 MED ORDER — MIDAZOLAM HCL 2 MG/2ML IJ SOLN: 2.0000 mg | Freq: Once | INTRAMUSCULAR | Status: AC | PRN

## 2014-07-13 MED ORDER — MIDAZOLAM HCL 5 MG/ML IJ SOLN
1.0000 mg/h | INTRAMUSCULAR | Status: DC
  Administered 2014-07-13: 2 mg/h via INTRAVENOUS
  Administered 2014-07-13 – 2014-07-14 (×2): 4 mg/h via INTRAVENOUS
  Administered 2014-07-14: 6 mg/h via INTRAVENOUS
  Administered 2014-07-15 (×2): 5 mg/h via INTRAVENOUS
  Administered 2014-07-16: 4 mg/h via INTRAVENOUS
  Administered 2014-07-16: 5 mg/h via INTRAVENOUS
  Administered 2014-07-17 – 2014-07-18 (×4): 4 mg/h via INTRAVENOUS
  Filled 2014-07-13 (×12): qty 10

## 2014-07-13 MED ORDER — METHYLPREDNISOLONE SODIUM SUCC 125 MG IJ SOLR
INTRAMUSCULAR | Status: AC
  Administered 2014-07-13: 250 mg via INTRAVENOUS
  Filled 2014-07-13: qty 4

## 2014-07-13 MED ORDER — FENTANYL BOLUS VIA INFUSION
50.0000 ug | INTRAVENOUS | Status: DC | PRN
  Administered 2014-07-13 – 2014-07-17 (×3): 50 ug via INTRAVENOUS
  Filled 2014-07-13: qty 50

## 2014-07-13 MED ORDER — CETYLPYRIDINIUM CHLORIDE 0.05 % MT LIQD: 7.0000 mL | Freq: Four times a day (QID) | OROMUCOSAL | Status: DC

## 2014-07-13 MED ORDER — MIDAZOLAM HCL 2 MG/2ML IJ SOLN
2.0000 mg | INTRAMUSCULAR | Status: DC | PRN
  Filled 2014-07-13 (×2): qty 2

## 2014-07-13 MED ORDER — ARTIFICIAL TEARS OP OINT
1.0000 "application " | TOPICAL_OINTMENT | Freq: Three times a day (TID) | OPHTHALMIC | Status: DC
  Administered 2014-07-13 – 2014-07-19 (×18): 1 via OPHTHALMIC
  Filled 2014-07-13 (×2): qty 3.5

## 2014-07-13 MED ORDER — SODIUM BICARBONATE 8.4 % IV SOLN
INTRAVENOUS | Status: DC
  Administered 2014-07-13 – 2014-07-16 (×4): via INTRAVENOUS
  Filled 2014-07-13 (×7): qty 150

## 2014-07-13 MED ORDER — CHLORHEXIDINE GLUCONATE 0.12 % MT SOLN: 15.0000 mL | Freq: Two times a day (BID) | OROMUCOSAL | Status: DC

## 2014-07-13 MED ORDER — CISATRACURIUM BOLUS VIA INFUSION
10.0000 mg | Freq: Once | INTRAVENOUS | Status: AC
  Administered 2014-07-13: 10 mg via INTRAVENOUS
  Filled 2014-07-13: qty 10

## 2014-07-13 MED ORDER — ALBUTEROL SULFATE (2.5 MG/3ML) 0.083% IN NEBU
2.5000 mg | INHALATION_SOLUTION | RESPIRATORY_TRACT | Status: DC
  Administered 2014-07-13 – 2014-07-16 (×8): 2.5 mg via RESPIRATORY_TRACT
  Filled 2014-07-13 (×7): qty 3

## 2014-07-13 MED ORDER — ENOXAPARIN SODIUM 40 MG/0.4ML ~~LOC~~ SOLN
40.0000 mg | SUBCUTANEOUS | Status: DC
  Administered 2014-07-13 – 2014-07-18 (×6): 40 mg via SUBCUTANEOUS
  Filled 2014-07-13 (×7): qty 0.4

## 2014-07-13 MED ORDER — MIDAZOLAM HCL 2 MG/2ML IJ SOLN
2.0000 mg | Freq: Once | INTRAMUSCULAR | Status: DC
Start: 2014-07-13 — End: 2014-07-19

## 2014-07-13 MED ORDER — FENTANYL CITRATE (PF) 100 MCG/2ML IJ SOLN: 50.0000 ug | Freq: Once | INTRAMUSCULAR | Status: DC

## 2014-07-13 MED ORDER — VANCOMYCIN HCL IN DEXTROSE 750-5 MG/150ML-% IV SOLN
750.0000 mg | Freq: Two times a day (BID) | INTRAVENOUS | Status: DC
  Administered 2014-07-14 – 2014-07-15 (×3): 750 mg via INTRAVENOUS
  Filled 2014-07-13 (×4): qty 150

## 2014-07-13 MED ORDER — OSMOLITE 1.5 CAL PO LIQD
1000.0000 mL | ORAL | Status: DC
Start: 2014-07-13 — End: 2014-07-13
  Filled 2014-07-13: qty 1000

## 2014-07-13 MED ORDER — MIDAZOLAM HCL 2 MG/2ML IJ SOLN
2.0000 mg | INTRAMUSCULAR | Status: DC | PRN
  Administered 2014-07-13 (×2): 2 mg via INTRAVENOUS
  Filled 2014-07-13: qty 2

## 2014-07-13 NOTE — Progress Notes (Addendum)
Name: [name] MRN: [id number] DOB: [birth year]    ADMISSION DATE:  07/13/2014  CHIEF COMPLAINT:  Respiratory failure   BRIEF PATIENT DESCRIPTION:  59 yo male smoker has dyspnea, subjective fever since April 2016 with acute hypoxic respiratory failure and persistent pulmonary infiltrates.   SIGNIFICANT EVENTS  4/28 to 5/08 Admit to [facility] with PNA 5/19 Admit to [facility] 5/20 Transfer to [facility] 5/27 s/p VATS  STUDIES:  4/28 CT chest >> Rt suprahilar LAN 2.3 cm, Rt hilar LAN 3.3 cm, Lt infrahilar LAN 2.6 cm, interlobular septal thickening and scattered GGO, patchy nodular infiltrates 5/20 CT chest >> progression of bilat patchy opacities with new Bilat lower lobe consolidation with air bronchograms.  NO change R hilar enlargement (?lymphadenopathy v mass).  5/20 Echo >> mild LVH, EF 60 to 65% 5/22 ACE >> 32 5/23 RF >> 8.8, ANCA negative  SUBJECTIVE:  desat overnight and agitation, needs pain control  VITAL SIGNS: Temp:  [96 F (35.6 C)-98 F (36.7 C)] 97.3 F (36.3 C) (05/28 0700) Pulse Rate:  [66-85] 66 (05/28 0700) Resp:  [0-36] 21 (05/28 0700) BP: (107-127)/(63-92) 127/73 mmHg (05/28 0456) SpO2:  [84 %-100 %] 100 % (05/28 0700) Arterial Line BP: (120-142)/(70-86) 141/78 mmHg (05/28 0700) FiO2 (%):  [60 %-100 %] 100 % (05/28 0510) Weight:  [102.15 kg (225 lb 3.2 oz)] 102.15 kg (225 lb 3.2 oz) (05/28 0400)  VENT SETTINGS: Vent Mode:  [-] PRVC FiO2 (%):  [60 %-100 %] 100 % Set Rate:  [12 bmp-18 bmp] 18 bmp Vt Set:  [550 mL] 550 mL PEEP:  [5 cmH20] 5 cmH20 Pressure Support:  [10 cmH20] 10 cmH20 Plateau Pressure:  [23 cmH20-32 cmH20] 30 cmH20  PHYSICAL EXAMINATION: General: sedated RASS -1 Neuro: normal strength HEENT: ETT in place Cardiovascular: regular Lungs:  bibasilar crackles, no wheeze, chest tubes in place Abdomen:  Soft, non tender Musculoskeletal: no edema Skin: no rashes   CMP Latest Ref Rng 07/13/2014 07/11/2014 07/09/2014  Glucose 65 - 99 mg/dL 188(H)  241(H) 234(H)  BUN 6 - 20 mg/dL 41(H) 34(H) 35(H)  Creatinine 0.61 - 1.24 mg/dL 0.99 1.13 1.15  Sodium 135 - 145 mmol/L 133(L) 131(L) 132(L)  Potassium 3.5 - 5.1 mmol/L 5.1 4.8 4.8  Chloride 101 - 111 mmol/L 100(L) 96(L) 99(L)  CO2 22 - 32 mmol/L '26 27 26  '$ Calcium 8.9 - 10.3 mg/dL 8.1(L) 8.7(L) 8.8(L)  Total Protein 6.5 - 8.1 g/dL - - 6.1(L)  Total Bilirubin 0.3 - 1.2 mg/dL - - 0.8  Alkaline Phos 38 - 126 U/L - - 72  AST 15 - 41 U/L - - 30  ALT 17 - 63 U/L - - 25    CBC Latest Ref Rng 07/13/2014 07/11/2014 07/09/2014  WBC 4.0 - 10.5 K/uL 8.2 9.3 8.3  Hemoglobin 13.0 - 17.0 g/dL 11.9(L) 12.2(L) 12.1(L)  Hematocrit 39.0 - 52.0 % 37.2(L) 38.9(L) 38.6(L)  Platelets 150 - 400 K/uL 131(L) 157 159    CBG (last 3)   Recent Labs  07/06/2014 1941 07/10/2014 2332 07/13/14 0400  GLUCAP 132* 171* 160*     Dg Chest Port 1 View  06/18/2014   CLINICAL DATA:  Left-sided fluoroscopy wedge resection.  Postop.  EXAM: PORTABLE CHEST - 1 VIEW  COMPARISON:  1 day prior are  FINDINGS: Endotracheal tube terminates 2.7 cm above carina. Nasogastric tube extends beyond the inferior aspect of the thumb. Left internal jugular line terminates at the high SVC.  A left chest tube is new. Cardiomegaly accentuated  by AP portable technique. Small bilateral pleural effusions are similar. No pneumothorax. Patchy bibasilar airspace disease is not significantly changed.  IMPRESSION: Appropriate position of support apparatus, including endotracheal tube 2.7 cm above carina.  No change in bibasilar airspace disease with probable small bilateral pleural effusions.  No pneumothorax.  Report called to operating room 11:37 a.m.   Electronically Signed   By: Abigail Miyamoto M.D.   On: 06/23/2014 11:38     ASSESSMENT / PLAN:  PULMONARY A: Acute hypoxic respiratory failure with persistent pulmonary infiltrates. ARDS pattern/physiology Volume execess,  I>>O P: F/u VATS bx results Full vent support>>ARDS protocol Sedation  protocol IV lasix IVF KVO F/u CXR Continue solumedrol Post op care per TCTS  CARDIAC A: Hx of HTN. Pos volume P: Monitor hemodynamics Lasix F/u bmet  RENAL A: Mild hyponatremia. P: F/u BMET  GASTRO A: Nutrition. P: NPO Protonix Tube feeds if unable to extubate soon  HEME A: No acute issues. P: F/u CBC  INFECTION A: ?PNA >> complete Abx. Oral candidiasis. P: Monitor off Abx Continue diflucan  ENDOCRINE A: Steroid induced hyperglycemia. P: SSI  NEURO A: Sedation. P: RASS goal -1  CC timd 30  Minutes.  i updated spouse by phone 07/13/2014   Mariel Sleet Beeper  [phone]  Cell  [phone]  If no response or cell goes to voicemail, call beeper [place] 07/13/2014, 8:44 AM

## 2014-07-13 NOTE — Progress Notes (Signed)
PCCM  Pt with desats.  Had to start NMB , Versed, fent drip and place of PCV with Peep 14 rate 28 PCV35 fio2 1.0  F/u abg  [place]  Cell  [phone]  If no response or cell goes to voicemail, call beeper [phone]   07/13/2014 12:32 PM

## 2014-07-13 NOTE — Progress Notes (Signed)
Dr. Joya Gaskins paged about ABG results and pt's increasing HR that is sustaining in the 120's from the 90's-100's this AM.  No new orders received. Will continue to monitor pt closely.

## 2014-07-13 NOTE — Progress Notes (Signed)
[facility] Progress Note Patient Name: [name] DOB: [birth year] MRN: [id number]   Date of Service  07/13/2014  HPI/Events of Note  ABG on 100%/Power 10/Mean Airway Pressure 36.2 = 7.19/80/62/31.  eICU Interventions  Continue present management.     Intervention Category Major Interventions: Respiratory failure - evaluation and management  Lysle Dingwall 07/13/2014, 8:33 PM

## 2014-07-13 NOTE — Progress Notes (Signed)
[facility] PCCM  Called emergently to the bedside to evaluate [name].  This is my partner's patient who underwent a VATS lung biopsy on 5/27  for poorly understood pulmonary fibrosis.  The frozen section showed pulmonary fibrosis, type uncertain, final path pending.  Baseline echo with mild LVH, LVEF 60-65%, RV normal.  Throughout the course of the day he has become more hypoxemic.  CXR shows bibasilar chronic fibrotic change not much different than pre-operatively.  Lungs with equal, bilateral coarse breath sounds.  He is paralyzed on the vent.  Vent settings: PRVC, RR 30, TVol 500cc, PEEP 14, FiO2 100%  Acute hypoxemic respiratory failure> looks like ARDS picture vs flaring of underlying lung disease vs less likely pulmonary edema or pneumonia.  Plan: HFOV post recruitment maneuvers Continue nimbex Lasix again Solumedrol '250mg'$  IV now Treat for HCAP, though I don't think he has that Continue bicarb for worsening acidosis   Plan to monitor on HFOV for 4-6 hours, if no improvement in oxygenation then move to prone positioning. Goal paO2 > 50  Additional CC time today 60 minutes  Roselie Awkward, MD [place] [facility]M Pager: [phone] Cell: [phone] After 3pm or if no response, call [phone]

## 2014-07-13 NOTE — Op Note (Signed)
[name], [name] NO.:  [id number]  MEDICAL RECORD NO.:  [id number]  LOCATION:  2S15C                        FACILITY:  [facility]  PHYSICIAN:  Ivin Poot, M.D.  DATE OF BIRTH:  [birth year]  DATE OF PROCEDURE:  06/16/2014 DATE OF DISCHARGE:                              OPERATIVE REPORT   OPERATION: 1. Video bronchoscopy. 2. Endoscopic transbronchial ultrasound-guided biopsy of mediastinal     lymph nodes-endobronchial ultrasound. 3. Left VATS (video-assisted thoracoscopic surgery) with wedge     resection-biopsy of left upper lobe.  SURGEON:  Ivin Poot, M.D.  ASSISTANT:  Providence Crosby, PA-C  PREOPERATIVE DIAGNOSIS:  Bilateral pulmonary infiltrates-fibrosis with progressive oxygen demand.  POSTOPERATIVE DIAGNOSIS:  Bilateral pulmonary infiltrates-fibrosis with progressive oxygen demand.  ANESTHESIA:  General by Finis Bud, M.D.  CLINICAL NOTE:  The patient is a 59 year old ex-smoker who presents to the hospital with progressive shortness of breath and increasing oxygen demands, now on 15 L/minute of oxygen.  Chest x-ray, chest CT scan shows bilateral airspace disease with infiltrative densities as well as mediastinal adenopathy.  The patient had not responded well to steroids or antibiotics.  Lung biopsy was requested by Critical Care and Pulmonary Medicine.  The patient had been scheduled for bronchoscopy as well, but this had not been accomplished because of the patient's high oxygen demands.  I discussed the procedure, bronchoscopy with transbronchial lymph node biopsy as well as bronchial washings with the patient and family.  I discussed the procedure of left VATS for open lung biopsy of the left upper lobe to obtain pathologic diagnosis to help direct therapy of his non-diagnosed lung disease.  I discussed the procedure which would include general anesthesia, location of the surgical incision, and expected postoperative hospital  recovery including a chest tube system that would be required as well as probable postoperative mechanical ventilation due to his severe hypoxemia.  They understood the risks of bleeding, prolonged air leak, infection, and death.  He demonstrated his understanding and agreed to proceed with surgery under what I felt was an informed consent.  OPERATIVE FINDINGS: 1. Endobronchial anatomy was unremarkable by bronchoscopy. 2. Transbronchial biopsy of 4L lymph node was negative for tumor-     normal lymphoid tissue, on quick prep. 3. Left VATS with stapled wedge resection for biopsy demonstrated     pulmonary fibrosis from frozen section.  The specimen was sent in     its entirety for routine culture, AFB fungal culture, and     histopathology.  OPERATIVE PROCEDURE:  The patient was brought to the operating room and placed supine on the operating table where general anesthesia was induced.  A proper time-out was performed.  Through the endotracheal tube a video bronchoscope was passed.  The distal trachea and carina were normal.  The mainstem bronchus to the left lung was examined and was endobronchially normal.  The segments of left upper lobe and left lower lobe were examined and no endobronchial abnormalities.  The bronchoscope was passed on the right mainstem bronchus and then into the right upper lobe, right middle lobe, and right lower lobe endobronchial segments.  There were no endobronchial lesions noted.  Washings were sent for cytology and  culture.  Brushings of the right upper lobe were sent for cytology.  The bronchoscope was withdrawn and exchanged for the EBUS bronchoscope.  The EBUS bronchoscope was inserted.  Using the ultrasound imaging of the level R4 lymph nodes.  Transbronchial biopsies of the R4 lymph nodes were obtained.  The bronchoscope was withdrawn.  The patient was then exchanged for a double-lumen anterior endotracheal tube by the anesthesia team.  The  patient was then rolled left side up. The left chest was prepped and draped as a sterile field.  A second proper time-out was performed.  Small incisions were made around the left chest for the VATS portal incisions.  The camera was inserted through the port beneath the tip of the scapula.  The lung was examined. He had anthracotic changes from chronic smoking, but there were no significant adhesions or nodularities and the parietal pleura appeared to be normal.  Using the VATS instruments including the Endo-GIA stapling devices, the lingular segment of the left upper lobe was isolated and stapled and then removed.  Sent for appropriate studies and pathology.  The staple line was reinforced with a fine layer of medical adhesive-Coseal.  A 28-French chest tube was placed and directed to the apex of the hemithorax and connected to an underwater seal Pleur-Evac drainage system.  The lung was then re-expanded under direct vision.  The VATS portal incisions were then closed in layers using Vicryl. Sterile dressings were applied and the patient was then returned to the supine position for exchange of the double-lumen tube for a single lumen tube, which was performed without difficulty.  The patient was then taken to the ICU for further management and recovery in a stable condition.     Ivin Poot, M.D.     PV/MEDQ  D:  06/28/2014  T:  07/13/2014  Job:  [id number]

## 2014-07-13 NOTE — Progress Notes (Signed)
[facility] PCCM  Wife updated bedside.  She initially asked [place] to make his code status DNR.  She is a Marine scientist and does not want him to suffer with poor quality of life.  I encouraged her to be patient and to let us see where the night goes.  Specifically, we agreed that should he need CPR, we would not perform prolonged resuscitation (more than 5 minutes), but that we would continue with ventilatory support as outlined in my note previously.  Roselie Awkward, MD [place] P[facility]M Pager: [phone] Cell: [phone] After 3pm or if no response, call [phone]

## 2014-07-13 NOTE — Progress Notes (Signed)
1 Day Post-Op Procedure(s) (LRB): Left VIDEO ASSISTED THORACOSCOPY with wedge resection (Left) VIDEO BRONCHOSCOPY WITH ENDOBRONCHIAL ULTRASOUND (N/A) Subjective: Sedated on ventilator with low oxygen saturation Chest x-ray shows no pneumothorax, chest tube in good position No air leak from chest tube with minimal drainage  Sinus rhythm  Objective: Vital signs in last 24 hours: Temp:  [96 F (35.6 C)-98 F (36.7 C)] 97.3 F (36.3 C) (05/28 0700) Pulse Rate:  [66-88] 88 (05/28 1125) Cardiac Rhythm:  [-] Normal sinus rhythm (05/28 0900) Resp:  [0-36] 31 (05/28 1125) BP: (113-136)/(68-92) 136/91 mmHg (05/28 1125) SpO2:  [84 %-100 %] 85 % (05/28 1125) Arterial Line BP: (110-151)/(66-84) 116/70 mmHg (05/28 1100) FiO2 (%):  [60 %-100 %] 100 % (05/28 1125) Weight:  [225 lb 3.2 oz (102.15 kg)] 225 lb 3.2 oz (102.15 kg) (05/28 0400)  Hemodynamic parameters for last 24 hours:   stable  Intake/Output from previous day: 05/27 0701 - 05/28 0700 In: 4268 [I.V.:4098; NG/GT:120; IV Piggyback:50] Out: 1540 [Urine:1675; Blood:100; Chest Tube:100] Intake/Output this shift: Total I/O In: 304.6 [I.V.:274.6; NG/GT:30] Out: 1500 [Urine:1500] Exam Lungs clear no airleak Heart sounds without murmur or gallop Abdomen soft Extremities warm   Lab Results:  Recent Labs  07/11/14 0536 07/13/14 0310  WBC 9.3 8.2  HGB 12.2* 11.9*  HCT 38.9* 37.2*  PLT 157 131*   BMET:  Recent Labs  07/11/14 0536 07/13/14 0310  NA 131* 133*  K 4.8 5.1  CL 96* 100*  CO2 27 26  GLUCOSE 241* 188*  BUN 34* 41*  CREATININE 1.13 0.99  CALCIUM 8.7* 8.1*    PT/INR:  Recent Labs  07/11/14 0536  LABPROT 13.5  INR 1.01   ABG    Component Value Date/Time   PHART 7.352 07/13/2014 0944   HCO3 27.5* 07/13/2014 0944   TCO2 29 07/13/2014 0944   O2SAT 88.0 07/13/2014 0944   CBG (last 3)   Recent Labs  07/03/2014 2332 07/13/14 0400 07/13/14 0740  GLUCAP 171* 160* 145*     Assessment/Plan: S/P  Procedure(s) (LRB): Left VIDEO ASSISTED THORACOSCOPY with wedge resection (Left) VIDEO BRONCHOSCOPY WITH ENDOBRONCHIAL ULTRASOUND (N/A) Diuresis Start tube feeds slowly  LOS: 8 days    Tharon Aquas Trigt III 07/13/2014

## 2014-07-13 NOTE — Progress Notes (Signed)
CCM md made aware of sudden increase in BP 200/110s sustaining for several mins then decreasing to 150s/70s, and HR increases to 120s currently BIS at 43, no twitches present, MD wanted to bolus pt with '4mg'$  versed and 151mg fent from infusing drips Will cont to monitor and assess  After bolus bp 110s/70s and HR still 120s

## 2014-07-13 NOTE — Progress Notes (Signed)
Patient saturation level was at 80% upon entering room. Changes were made to setting to help patient increase PO2 and SATs, while also maintaining good Wiggle on the Oscillator per protocol. Patient initial ABG PO2 was 49 prior to changes. Gave patient a leak to help with PCO2 post ABG at 1940. Prior to most recent ABG changes made on HFOV were as follows: Decreased MAP from 40 to 36, Increased Amplitude from 7 to 10 increasing the power to above the PCO2 level of 80. Slight adjustment to the flow from 30 to 35 to help with the MAP. All these changes made and patient increased Sats from 80% to 88%, PO2 increased from 49 to 62, and after ABG gave patient a airway cuff leak to help CO2 and pH, ABG pending for those results. Spoke to Dr. Beatrix Shipper at [facility] and he was okay with changes and order to maintain patient at this time. XRay pending for later, patient tolerating well. Also another issue may be Bed isn't a firm mattress and must be reset every 30 minutes by RN to Max Inflate to maintain a hard surface to help with HFOV. MD and RN aware of this issue.

## 2014-07-13 NOTE — Progress Notes (Signed)
Vent changes made per MD order. Pt now on ARDS protocol. RT will continue to monitor.

## 2014-07-13 NOTE — Progress Notes (Signed)
Dr. Prescott Gum paged about preparing pt for paralytics and current order for tube feeds. Order received to hold starting tube feeds at this time. Will continue to monitor pt closely.

## 2014-07-13 NOTE — Progress Notes (Signed)
MD notified after placing on HFOV no twitches present with train of four. Currently nimbex drip infusing at 64mg/kg/min, MD did not want nimbex drip decreased to ensure adequate paralytics on board while on HFOV. Will cont to monitor and assess.

## 2014-07-13 NOTE — Progress Notes (Signed)
ANTIBIOTIC CONSULT NOTE - INITIAL  Pharmacy Consult for cefepime, vancomycin Indication: pneumonia  No Known Allergies  Patient Measurements: Height: '6\' 1"'$  (185.4 cm) Weight: 225 lb 3.2 oz (102.15 kg) IBW/kg (Calculated) : 79.9 Vital Signs: Temp: 97.3 F (36.3 C) (05/28 0700) Temp Source: Oral (05/28 0700) BP: 132/74 mmHg (05/28 1540) Pulse Rate: 114 (05/28 1540) Intake/Output from previous day: 05/27 0701 - 05/28 0700 In: 4268 [I.V.:4098; NG/GT:120; IV Piggyback:50] Out: 8242 [Urine:1675; Blood:100; Chest Tube:100] Intake/Output from this shift: Total I/O In: 441.2 [I.V.:411.2; NG/GT:30] Out: 2325 [Urine:2325] Labs:  Recent Labs  07/11/14 0536 07/13/14 0310  WBC 9.3 8.2  HGB 12.2* 11.9*  PLT 157 131*  CREATININE 1.13 0.99   Estimated Creatinine Clearance: 100.9 mL/min (by C-G formula based on Cr of 0.99). No results for input(s): VANCOTROUGH, VANCOPEAK, VANCORANDOM, GENTTROUGH, GENTPEAK, GENTRANDOM, TOBRATROUGH, TOBRAPEAK, TOBRARND, AMIKACINPEAK, AMIKACINTROU, AMIKACIN in the last 72 hours.   Microbiology: Recent Results (from the past 720 hour(s))  Blood culture (routine x 2)     Status: None   Collection Time: 06/13/14 10:42 PM  Result Value Ref Range Status   Specimen Description BLOOD LEFT ANTECUBITAL  Final   Special Requests BOTTLES DRAWN AEROBIC AND ANAEROBIC [place]  Final   Culture NO GROWTH 5 DAYS  Final   Report Status 06/18/2014 FINAL  Final  Blood culture (routine x 2)     Status: None   Collection Time: 06/13/14 10:52 PM  Result Value Ref Range Status   Specimen Description BLOOD RIGHT ANTECUBITAL  Final   Special Requests   Final    BOTTLES DRAWN AEROBIC AND ANAEROBIC AEB=5CC ANA=6CC   Culture NO GROWTH 5 DAYS  Final   Report Status 06/18/2014 FINAL  Final  MRSA PCR Screening     Status: None   Collection Time: 06/14/14  1:00 AM  Result Value Ref Range Status   MRSA by PCR NEGATIVE NEGATIVE Final    Comment:        The GeneXpert MRSA Assay  (FDA approved for NASAL specimens only), is one component of a comprehensive MRSA colonization surveillance program. It is not intended to diagnose MRSA infection nor to guide or monitor treatment for MRSA infections.   Culture, blood (routine x 2)     Status: None   Collection Time: 06/20/14  7:22 AM  Result Value Ref Range Status   Specimen Description BLOOD LEFT ANTECUBITAL  Final   Special Requests BOTTLES DRAWN AEROBIC ONLY 5CC  Final   Culture NO GROWTH 5 DAYS  Final   Report Status 06/25/2014 FINAL  Final  Culture, blood (routine x 2)     Status: None   Collection Time: 06/20/14  8:53 AM  Result Value Ref Range Status   Specimen Description BLOOD LEFT ARM  Final   Special Requests   Final    BOTTLES DRAWN AEROBIC AND ANAEROBIC AEB=12CC ANA=8CC   Culture NO GROWTH 5 DAYS  Final   Report Status 06/25/2014 FINAL  Final  Culture, blood (routine x 2)     Status: None   Collection Time: 07/13/2014 10:30 PM  Result Value Ref Range Status   Specimen Description BLOOD LEFT ARM  Final   Special Requests BOTTLES DRAWN AEROBIC AND ANAEROBIC 8CC EACH  Final   Culture NO GROWTH 5 DAYS  Final   Report Status 07/09/2014 FINAL  Final  Culture, blood (routine x 2)     Status: None   Collection Time: 07/07/2014 10:40 PM  Result Value Ref Range Status  Specimen Description BLOOD LEFT HAND  Final   Special Requests BOTTLES DRAWN AEROBIC AND ANAEROBIC [facility] EACH  Final   Culture NO GROWTH 5 DAYS  Final   Report Status 07/09/2014 FINAL  Final  MRSA PCR Screening     Status: None   Collection Time: 07/05/14  1:38 AM  Result Value Ref Range Status   MRSA by PCR NEGATIVE NEGATIVE Final    Comment:        The GeneXpert MRSA Assay (FDA approved for NASAL specimens only), is one component of a comprehensive MRSA colonization surveillance program. It is not intended to diagnose MRSA infection nor to guide or monitor treatment for MRSA infections.   Culture, sputum-assessment     Status:  None   Collection Time: 07/05/14 11:22 AM  Result Value Ref Range Status   Specimen Description SPU  Final   Special Requests NONE  Final   Sputum evaluation   Final    MICROSCOPIC FINDINGS SUGGEST THAT THIS SPECIMEN IS NOT REPRESENTATIVE OF LOWER RESPIRATORY SECRETIONS. PLEASE RECOLLECT.   Report Status 07/06/2014 FINAL  Final  Surgical pcr screen     Status: Abnormal   Collection Time: 07/11/14  8:01 PM  Result Value Ref Range Status   MRSA, PCR NEGATIVE NEGATIVE Final   Staphylococcus aureus POSITIVE (A) NEGATIVE Final    Comment:        The Xpert SA Assay (FDA approved for NASAL specimens in patients over 81 years of age), is one component of a comprehensive surveillance program.  Test performance has been validated by [facility] for patients greater than or equal to 32 year old. It is not intended to diagnose infection nor to guide or monitor treatment.   AFB culture with smear     Status: None (Preliminary result)   Collection Time: 06/22/2014  8:30 AM  Result Value Ref Range Status   Specimen Description BRONCHIAL WASHINGS  Final   Special Requests BW RIGHT UPPER LOBE POF ZINACEF  Final   Acid Fast Smear   Final    NO ACID FAST BACILLI SEEN Performed at [facility]    Culture   Final    CULTURE WILL BE EXAMINED FOR 6 WEEKS BEFORE ISSUING A FINAL REPORT Performed at [facility]    Report Status PENDING  Incomplete  Culture, respiratory (NON-Expectorated)     Status: None (Preliminary result)   Collection Time: 06/26/2014  8:30 AM  Result Value Ref Range Status   Specimen Description BRONCHIAL WASHINGS  Final   Special Requests BW RIGHT UPPER LOBE POF ZINACEF  Final   Gram Stain PENDING  Incomplete   Culture   Final    NO GROWTH 1 DAY Performed at [facility]    Report Status PENDING  Incomplete  Fungus Culture with Smear     Status: None (Preliminary result)   Collection Time: 07/07/2014  8:30 AM  Result Value Ref Range Status    Specimen Description BRONCHIAL WASHINGS  Final   Special Requests BW RIGHT UPPER LOBE POF ZINACEF  Final   Fungal Smear   Final    NO YEAST OR FUNGAL ELEMENTS SEEN Performed at [facility]    Culture   Final    CULTURE IN PROGRESS FOR FOUR WEEKS Performed at [facility]    Report Status PENDING  Incomplete  Fungus Culture with Smear     Status: None (Preliminary result)   Collection Time: 06/16/2014 10:45 AM  Result Value Ref Range  Status   Specimen Description TISSUE LEFT LUNG  Final   Special Requests SPEC F PT ON ZINACEF  Final   Fungal Smear   Final    NO YEAST OR FUNGAL ELEMENTS SEEN Performed at [facility]    Culture   Final    CULTURE IN PROGRESS FOR FOUR WEEKS Performed at [facility]    Report Status PENDING  Incomplete  Tissue culture     Status: None (Preliminary result)   Collection Time: 07/05/2014 10:45 AM  Result Value Ref Range Status   Specimen Description TISSUE LEFT LUNG  Final   Special Requests SPEC F PT ON ZINACEF  Final   Gram Stain   Final    FEW WBC PRESENT, PREDOMINANTLY MONONUCLEAR NO ORGANISMS SEEN Performed at [facility]    Culture   Final    NO GROWTH 1 DAY Performed at [facility]    Report Status PENDING  Incomplete  AFB culture with smear     Status: None (Preliminary result)   Collection Time: 06/18/2014 10:45 AM  Result Value Ref Range Status   Specimen Description TISSUE LEFT LUNG  Final   Special Requests SPEC F PT ON ZINACEF  Final   Acid Fast Smear   Final    NO ACID FAST BACILLI SEEN Performed at [facility]    Culture   Final    CULTURE WILL BE EXAMINED FOR 6 WEEKS BEFORE ISSUING A FINAL REPORT Performed at [facility]    Report Status PENDING  Incomplete    Medical History: Past Medical History  Diagnosis Date  . Hypertension   . 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 Traumatic tear of right rotator cuff 04/19/2014    Medications:   Anti-infectives    Start     Dose/Rate Route Frequency Ordered Stop   06/28/2014 2000  cefUROXime (ZINACEF) 1.5 g in dextrose 5 % 50 mL IVPB     1.5 g 100 mL/hr over 30 Minutes Intravenous Every 12 hours 07/03/2014 1100 07/14/14 1959   06/26/2014 1115  vancomycin (VANCOCIN) IVPB 1000 mg/200 mL premix  Status:  Discontinued     1,000 mg 200 mL/hr over 60 Minutes Intravenous Every 12 hours 07/02/2014 1100 07/15/2014 1110   07/05/2014 1115  vancomycin (VANCOCIN) IVPB 1000 mg/200 mL premix  Status:  Discontinued     1,000 mg 200 mL/hr over 60 Minutes Intravenous Every 12 hours 07/11/2014 1110 07/02/2014 1129   06/21/2014 0600  cefUROXime (ZINACEF) 1.5 g in dextrose 5 % 50 mL IVPB    Comments:  60 minutes preop   1.5 g 100 mL/hr over 30 Minutes Intravenous To [facility] 07/11/14 1223 06/23/2014 0800   07/10/14 1030  fluconazole (DIFLUCAN) tablet 100 mg  Status:  Discontinued     100 mg Oral Daily 07/10/14 1003 07/10/14 1014   07/10/14 1030  [MAR Hold]  fluconazole (DIFLUCAN) tablet 100 mg  Status:  Discontinued     (MAR Hold since 07/13/2014 0634)   100 mg Oral Daily 07/10/14 1014 07/03/2014 1100   07/07/14 1000  levofloxacin (LEVAQUIN) tablet 750 mg  Status:  Discontinued     750 mg Oral Daily 07/06/14 1219 07/09/14 1317   07/07/14 0900  levofloxacin (LEVAQUIN) IVPB 750 mg  Status:  Discontinued     750 mg 100 mL/hr over 90 Minutes Intravenous Every 24 hours 07/06/14 1155 07/06/14 1218   07/05/14 2100  vancomycin (VANCOCIN) 1,250 mg in sodium chloride 0.9 %  250 mL IVPB  Status:  Discontinued     1,250 mg 166.7 mL/hr over 90 Minutes Intravenous Every 12 hours 07/05/14 1104 07/06/14 1218   07/05/14 0900  vancomycin (VANCOCIN) 2,000 mg in sodium chloride 0.9 % 500 mL IVPB     2,000 mg 250 mL/hr over 120 Minutes Intravenous  Once 07/05/14 0757 07/05/14 1100   07/05/14 0900  levofloxacin (LEVAQUIN) IVPB 500 mg  Status:  Discontinued     500 mg 100 mL/hr over 60 Minutes Intravenous Every 24 hours 07/05/14  0813 07/06/14 1155   07/05/14 0600  ceFEPIme (MAXIPIME) 1 g in dextrose 5 % 50 mL IVPB  Status:  Discontinued     1 g 100 mL/hr over 30 Minutes Intravenous 3 times per day 07/05/14 0133 07/06/14 1218   06/21/2014 2330  vancomycin (VANCOCIN) 2,000 mg in sodium chloride 0.9 % 500 mL IVPB  Status:  Discontinued     2,000 mg 250 mL/hr over 120 Minutes Intravenous  Once 07/13/2014 2326 07/05/14 0133   07/11/2014 2300  ceFEPIme (MAXIPIME) 1 g in dextrose 5 % 50 mL IVPB     1 g 100 mL/hr over 30 Minutes Intravenous  Once 06/20/2014 2246 07/05/14 0051     Assessment: 59 year old male s/p cefuroxime now s/p VATS 5/27 with persistent infiltrates,de-satting requiring paralysis on ventilator to resume vancomycin and cefepime for r/o HCAP. Hypothermic overnight. WBC is within normal limits. Patient has been off both since 5/20. SCr 0.99/Normalized CrCl ~80-85 mL/min.   5/27 -BAL >> 5/27 L-lung tissue >> Prior cultures negative  Goal of Therapy:  Vancomycin trough level 15-20 mcg/ml  Plan:  D/C Zinacef (last dose 8 am) Cefepime 1g IV q8h. Vancomycin 2g IV now, then '750mg'$  IV every 12 hours.   Sloan Leiter, PharmD, BCPS Clinical Pharmacist [phone] 07/13/2014,3:57 PM

## 2014-07-13 NOTE — Progress Notes (Signed)
[place] Progress Note Patient Name: [name] DOB: 10-30-1955 MRN: [id number]   Date of Service  07/13/2014  HPI/Events of Note  Last ABG = 7.2/82/49. PH and pCO2 the same or better.   eICU Interventions  Will attempt another recruitment maneuver.      Intervention Category Major Interventions: Respiratory failure - evaluation and management;Hypoxemia - evaluation and management  Lysle Dingwall 07/13/2014, 6:47 PM

## 2014-07-13 NOTE — Progress Notes (Signed)
Dr. Joya Gaskins paged about pt's oxygen level remaining less than 85% despite increase in PEEP and sedation. Orders received to prepare pt for paralytics. Will carry out orders and continue to monitor pt closely.

## 2014-07-14 ENCOUNTER — Inpatient Hospital Stay (HOSPITAL_COMMUNITY): Payer: 59

## 2014-07-14 DIAGNOSIS — N181 Chronic kidney disease, stage 1: Secondary | ICD-10-CM

## 2014-07-14 LAB — POCT I-STAT 3, ART BLOOD GAS (G3+)
ACID-BASE EXCESS: 3 mmol/L — AB (ref 0.0–2.0)
ACID-BASE EXCESS: 5 mmol/L — AB (ref 0.0–2.0)
ACID-BASE EXCESS: 6 mmol/L — AB (ref 0.0–2.0)
Acid-Base Excess: 8 mmol/L — ABNORMAL HIGH (ref 0.0–2.0)
Acid-Base Excess: 8 mmol/L — ABNORMAL HIGH (ref 0.0–2.0)
BICARBONATE: 32.6 meq/L — AB (ref 20.0–24.0)
BICARBONATE: 31.4 meq/L — AB (ref 20.0–24.0)
BICARBONATE: 33.7 meq/L — AB (ref 20.0–24.0)
Bicarbonate: 28.3 mEq/L — ABNORMAL HIGH (ref 20.0–24.0)
Bicarbonate: 31.7 mEq/L — ABNORMAL HIGH (ref 20.0–24.0)
Bicarbonate: 34.6 mEq/L — ABNORMAL HIGH (ref 20.0–24.0)
O2 SAT: 94 %
O2 SAT: 90 %
O2 SAT: 82 %
O2 Saturation: 79 %
O2 Saturation: 89 %
O2 Saturation: 97 %
PCO2 ART: 46.4 mmHg — AB (ref 35.0–45.0)
PCO2 ART: 47.7 mmHg — AB (ref 35.0–45.0)
PCO2 ART: 51.4 mmHg — AB (ref 35.0–45.0)
PH ART: 7.275 — AB (ref 7.350–7.450)
PH ART: 7.338 — AB (ref 7.350–7.450)
PH ART: 7.455 — AB (ref 7.350–7.450)
PO2 ART: 76 mmHg — AB (ref 80.0–100.0)
PO2 ART: 56 mmHg — AB (ref 80.0–100.0)
PO2 ART: 43 mmHg — AB (ref 80.0–100.0)
Patient temperature: 37.3
Patient temperature: 97.5
Patient temperature: 97.8
TCO2: 30 mmol/L (ref 0–100)
TCO2: 33 mmol/L (ref 0–100)
TCO2: 34 mmol/L (ref 0–100)
TCO2: 34 mmol/L (ref 0–100)
TCO2: 35 mmol/L (ref 0–100)
TCO2: 36 mmol/L (ref 0–100)
pCO2 arterial: 60.3 mmHg (ref 35.0–45.0)
pCO2 arterial: 61.1 mmHg (ref 35.0–45.0)
pCO2 arterial: 61.3 mmHg (ref 35.0–45.0)
pH, Arterial: 7.319 — ABNORMAL LOW (ref 7.350–7.450)
pH, Arterial: 7.436 (ref 7.350–7.450)
pH, Arterial: 7.433 (ref 7.350–7.450)
pO2, Arterial: 101 mmHg — ABNORMAL HIGH (ref 80.0–100.0)
pO2, Arterial: 41 mmHg — ABNORMAL LOW (ref 80.0–100.0)
pO2, Arterial: 67 mmHg — ABNORMAL LOW (ref 80.0–100.0)

## 2014-07-14 LAB — CULTURE, RESPIRATORY W GRAM STAIN

## 2014-07-14 LAB — GLUCOSE, CAPILLARY
GLUCOSE-CAPILLARY: 114 mg/dL — AB (ref 65–99)
GLUCOSE-CAPILLARY: 150 mg/dL — AB (ref 65–99)
GLUCOSE-CAPILLARY: 251 mg/dL — AB (ref 65–99)
Glucose-Capillary: 237 mg/dL — ABNORMAL HIGH (ref 65–99)
Glucose-Capillary: 208 mg/dL — ABNORMAL HIGH (ref 65–99)
Glucose-Capillary: 175 mg/dL — ABNORMAL HIGH (ref 65–99)

## 2014-07-14 LAB — CBC
HCT: 41 % (ref 39.0–52.0)
Hemoglobin: 12.8 g/dL — ABNORMAL LOW (ref 13.0–17.0)
MCH: 28.6 pg (ref 26.0–34.0)
MCHC: 31.2 g/dL (ref 30.0–36.0)
MCV: 91.7 fL (ref 78.0–100.0)
Platelets: 166 10*3/uL (ref 150–400)
RBC: 4.47 MIL/uL (ref 4.22–5.81)
RDW: 13.8 % (ref 11.5–15.5)
WBC: 10.1 10*3/uL (ref 4.0–10.5)

## 2014-07-14 LAB — COMPREHENSIVE METABOLIC PANEL
ALBUMIN: 2.3 g/dL — AB (ref 3.5–5.0)
ALK PHOS: 76 U/L (ref 38–126)
ALT: 31 U/L (ref 17–63)
AST: 30 U/L (ref 15–41)
Anion gap: 5 (ref 5–15)
BILIRUBIN TOTAL: 0.6 mg/dL (ref 0.3–1.2)
BUN: 53 mg/dL — ABNORMAL HIGH (ref 6–20)
CHLORIDE: 97 mmol/L — AB (ref 101–111)
CO2: 31 mmol/L (ref 22–32)
Calcium: 8.1 mg/dL — ABNORMAL LOW (ref 8.9–10.3)
Creatinine, Ser: 1.32 mg/dL — ABNORMAL HIGH (ref 0.61–1.24)
GFR calc Af Amer: >60 mL/min (ref >60)
GFR calc non Af Amer: 57 mL/min — ABNORMAL LOW (ref >60)
Glucose, Bld: 254 mg/dL — ABNORMAL HIGH (ref 65–99)
POTASSIUM: 5 mmol/L (ref 3.5–5.1)
SODIUM: 133 mmol/L — AB (ref 135–145)
TOTAL PROTEIN: 6.6 g/dL (ref 6.5–8.1)

## 2014-07-14 LAB — CULTURE, RESPIRATORY

## 2014-07-14 MED ORDER — CHLORHEXIDINE GLUCONATE CLOTH 2 % EX PADS
6.0000 | MEDICATED_PAD | Freq: Every day | CUTANEOUS | Status: AC
  Administered 2014-07-14 – 2014-07-16 (×3): 6 via TOPICAL

## 2014-07-14 MED ORDER — SODIUM CHLORIDE 0.45 % IV SOLN
INTRAVENOUS | Status: DC
  Administered 2014-07-14: 09:00:00 via INTRAVENOUS

## 2014-07-14 MED ORDER — PHENYLEPHRINE HCL 10 MG/ML IJ SOLN
0.0000 ug/min | INTRAVENOUS | Status: DC
  Administered 2014-07-14: 50 ug/min via INTRAVENOUS
  Filled 2014-07-14 (×2): qty 4

## 2014-07-14 MED ORDER — INSULIN GLARGINE 100 UNIT/ML ~~LOC~~ SOLN
14.0000 [IU] | Freq: Two times a day (BID) | SUBCUTANEOUS | Status: DC
  Administered 2014-07-14 – 2014-07-16 (×5): 14 [IU] via SUBCUTANEOUS
  Filled 2014-07-14 (×6): qty 0.14

## 2014-07-14 NOTE — Progress Notes (Signed)
Critical ABG results called to Dr. Elsworth Soho. MD ok with results. Continue therapy. ABG repeat at [facility]

## 2014-07-14 NOTE — Progress Notes (Signed)
RN called RT with ABG results. PH:7.41, CO2: 51.4, HCO3: 34.6, O2: 41. RT increased FiO2 from 90% to 100%. MAP increased from 36 to 40. Amplitude remains at 10. Frequency '6Hz'$ . Will recheck ABG @ 0100. Pt has good Wiggle. Pt still has a cuff leak present. RN/RT continue to monitor and keep mattress on max inflate. RT will monitor.

## 2014-07-14 NOTE — Progress Notes (Signed)
2 Days Post-Op Procedure(s) (LRB): Left VIDEO ASSISTED THORACOSCOPY with wedge resection (Left) VIDEO BRONCHOSCOPY WITH ENDOBRONCHIAL ULTRASOUND (N/A) Subjective: Sedated on jet ventillator- no pneumo or air leak from chest tube  Objective: Vital signs in last 24 hours: Temp:  [97.5 F (36.4 C)-98.3 F (36.8 C)] 97.6 F (36.4 C) (05/29 0830) Pulse Rate:  [81-130] 83 (05/29 0945) Cardiac Rhythm:  [-] Normal sinus rhythm (05/29 0945) Resp:  [0-31] 0 (05/29 0945) BP: (88-160)/(57-91) 109/68 mmHg (05/29 0930) SpO2:  [78 %-99 %] 98 % (05/29 0945) Arterial Line BP: (74-150)/(15-84) 100/67 mmHg (05/29 0945) FiO2 (%):  [100 %] 100 % (05/29 0945)  Hemodynamic parameters for last 24 hours:  O2 sats > 90%  Intake/Output from previous day: 05/28 0701 - 05/29 0700 In: 3870.7 [I.V.:2918.7; NG/GT:150; IV Piggyback:802] Out: 4709 [Urine:3990; Emesis/NG output:500; Chest Tube:60] Intake/Output this shift: Total I/O In: 423.6 [I.V.:343.6; NG/GT:30; IV Piggyback:50] Out: 155 [Urine:135; Chest Tube:20]  nsr extrem warm  Lab Results:  Recent Labs  07/13/14 0310 07/13/14 1654 07/14/14 0400  WBC 8.2  --  10.1  HGB 11.9* 16.7 12.8*  HCT 37.2* 49.0 41.0  PLT 131*  --  166   BMET:  Recent Labs  07/13/14 0310 07/13/14 1654 07/14/14 0400  NA 133* 133* 133*  K 5.1 5.4* 5.0  CL 100* 98* 97*  CO2 26  --  31  GLUCOSE 188* 186* 254*  BUN 41* 45* 53*  CREATININE 0.99 1.20 1.32*  CALCIUM 8.1*  --  8.1*    PT/INR: No results for input(s): LABPROT, INR in the last 72 hours. ABG    Component Value Date/Time   PHART 7.319* 07/14/2014 0608   HCO3 31.7* 07/14/2014 0608   TCO2 34 07/14/2014 0608   O2SAT 94.0 07/14/2014 0608   CBG (last 3)   Recent Labs  07/13/14 2331 07/14/14 0336 07/14/14 0836  GLUCAP 251* 237* 208*    Assessment/Plan: S/P Procedure(s) (LRB): Left VIDEO ASSISTED THORACOSCOPY with wedge resection (Left) VIDEO BRONCHOSCOPY WITH ENDOBRONCHIAL ULTRASOUND  (N/A) Cont chest tbe drain Cont vent care per CCM   LOS: 9 days    Tharon Aquas Trigt III 07/14/2014

## 2014-07-14 NOTE — Progress Notes (Addendum)
Name: [name] MRN: [id number] DOB: 09/11/1955    ADMISSION DATE:  07/16/2014  CHIEF COMPLAINT:  Respiratory failure   BRIEF PATIENT DESCRIPTION:  59 yo male smoker has dyspnea, subjective fever since April 2016 with acute hypoxic respiratory failure and persistent pulmonary infiltrates.   SIGNIFICANT EVENTS  4/28 to 5/08 Admit to [facility] with PNA 5/19 Admit to [facility] 5/20 Transfer to [facility] 5/27 s/p VATS 5/28 ARDS severe, placed on NMB and HFOV  STUDIES:  4/28 CT chest >> Rt suprahilar LAN 2.3 cm, Rt hilar LAN 3.3 cm, Lt infrahilar LAN 2.6 cm, interlobular septal thickening and scattered GGO, patchy nodular infiltrates 5/20 CT chest >> progression of bilat patchy opacities with new Bilat lower lobe consolidation with air bronchograms.  NO change R hilar enlargement (?lymphadenopathy v mass).  5/20 Echo >> mild LVH, EF 60 to 65% 5/22 ACE >> 32 5/23 RF >> 8.8, ANCA negative  SUBJECTIVE:  Required HFOV and NMB PM of 5/28.    VITAL SIGNS: Temp:  [97.5 F (36.4 C)-98.3 F (36.8 C)] 97.5 F (36.4 C) (05/29 0400) Pulse Rate:  [78-130] 88 (05/29 0815) Resp:  [0-31] 0 (05/29 0815) BP: (88-160)/(57-91) 104/66 mmHg (05/29 0800) SpO2:  [78 %-98 %] 98 % (05/29 0815) Arterial Line BP: (74-151)/(15-84) 95/64 mmHg (05/29 0815) FiO2 (%):  [100 %] 100 % (05/29 0815)  VENT SETTINGS: Vent Mode:  [-] Other (Comment) FiO2 (%):  [100 %] 100 % Set Rate:  [26 bmp-30 bmp] 30 bmp Vt Set:  [480 mL-500 mL] 500 mL PEEP:  [12 cmH20-18 cmH20] 14 cmH20 Delta P (Amplitude):  [10] 10 Mean Airway Pressure:  [35.7 cmH2O-38 cmH2O] 36.5 cmH2O Plateau Pressure:  [30 GYJ85-63 cmH20] 30 cmH20  PHYSICAL EXAMINATION: General: sedated RASS -5, NMB in place Neuro:  BIS 46, NMB in place, paralyzed HEENT: ETT in place Cardiovascular: regular Lungs:  bibasilar crackles, no wheeze, chest tubes in place, no leak Abdomen:  Soft, non tender. Good wiggle on hfov Musculoskeletal: no edema Skin: no  rashes   CMP Latest Ref Rng 07/14/2014 07/13/2014 07/13/2014  Glucose 65 - 99 mg/dL 254(H) 186(H) 188(H)  BUN 6 - 20 mg/dL 53(H) 45(H) 41(H)  Creatinine 0.61 - 1.24 mg/dL 1.32(H) 1.20 0.99  Sodium 135 - 145 mmol/L 133(L) 133(L) 133(L)  Potassium 3.5 - 5.1 mmol/L 5.0 5.4(H) 5.1  Chloride 101 - 111 mmol/L 97(L) 98(L) 100(L)  CO2 22 - 32 mmol/L 31 - 26  Calcium 8.9 - 10.3 mg/dL 8.1(L) - 8.1(L)  Total Protein 6.5 - 8.1 g/dL 6.6 - -  Total Bilirubin 0.3 - 1.2 mg/dL 0.6 - -  Alkaline Phos 38 - 126 U/L 76 - -  AST 15 - 41 U/L 30 - -  ALT 17 - 63 U/L 31 - -    CBC Latest Ref Rng 07/14/2014 07/13/2014 07/13/2014  WBC 4.0 - 10.5 K/uL 10.1 - 8.2  Hemoglobin 13.0 - 17.0 g/dL 12.8(L) 16.7 11.9(L)  Hematocrit 39.0 - 52.0 % 41.0 49.0 37.2(L)  Platelets 150 - 400 K/uL 166 - 131(L)    CBG (last 3)   Recent Labs  07/13/14 2017 07/13/14 2331 07/14/14 0336  GLUCAP 271* 251* 237*     Dg Chest Port 1 View  07/14/2014   CLINICAL DATA:  Adult respiratory distress syndrome. Chest tube on left side. Pt is on an oscillator breathing machine.  EXAM: PORTABLE CHEST - 1 VIEW  COMPARISON:  07/13/2014  FINDINGS: No change in lower lung zone interstitial airspace opacities. Hemidiaphragms remain  mostly obscured. There are probable small effusions. No new lung opacities.  No pneumothorax.  Left-sided chest tube, nasogastric tube, left internal jugular central venous line and endotracheal tube stable. Tip of the endotracheal tube projects lower, 5 cm above the carina, likely due to differences in patient positioning only.  IMPRESSION: 1. No significant change from the previous day's study. 2. Persistent lower lung zone interstitial and airspace opacities and probable small effusions.   Electronically Signed   By: Lajean Manes M.D.   On: 07/14/2014 07:35   Dg Chest Port 1 View  07/13/2014   CLINICAL DATA:  Acute respiratory failure with hypoxia  EXAM: PORTABLE CHEST - 1 VIEW  COMPARISON:  07/05/2014 at 1054 hours   FINDINGS: Patchy bilateral lower lobe opacities. Possible small bilateral pleural effusions.  Stable left apical chest tube.  No pneumothorax.  Cardiomegaly.  Endotracheal tube terminates 7 cm above the carina.  Stable left IJ venous catheter terminates in the mid SVC.  Enteric tube terminates in the proximal gastric body.  IMPRESSION: Endotracheal tube terminates 7 cm above the carina.  Patchy bilateral lower lobe opacities, unchanged. Possible small bilateral pleural effusions.  Stable left apical chest tube.  No pneumothorax is seen.   Electronically Signed   By: Julian Hy M.D.   On: 07/13/2014 18:23   Dg Chest Port 1 View  07/13/2014   CLINICAL DATA:  Hypoxia  EXAM: PORTABLE CHEST - 1 VIEW  COMPARISON:  07/05/2014  FINDINGS: Cardiomediastinal silhouette is stable. Endotracheal tube with tip 5.5 cm above the carina. Stable left chest tube position. There is no pneumothorax. Stable NG tube position. Stable left IJ central line position. Persistent bilateral lower lobe atelectasis or infiltrate and small pleural effusion.  IMPRESSION: Stable left chest tube position. Stable support apparatus. Persistent bilateral lower lobe atelectasis or infiltrate and small pleural effusion. There is no pneumothorax.   Electronically Signed   By: Lahoma Crocker M.D.   On: 07/13/2014 14:53   Dg Chest Port 1 View  07/13/2014   CLINICAL DATA:  Status post partial lobectomy.  EXAM: PORTABLE CHEST - 1 VIEW  COMPARISON:  06/29/2014  FINDINGS: The endotracheal tube tip is above the carina. There is a left IJ catheter with tip in the SVC. Left-sided chest tube is in place. No significant pneumothorax identified. There is gas identified within the soft tissues of the left chest wall around the chest tube insertion site. The NG tube tip is in the stomach. Moderate cardiac enlargement. There is bilateral lower lobe airspace opacities and small bilateral pleural effusions. Unchanged from previous exam.  IMPRESSION: No change in  aeration the lungs compared with previous exam.  Stable support apparatus.  Less chest tube in place without significant pneumothorax.   Electronically Signed   By: Kerby Moors M.D.   On: 07/13/2014 09:19   Dg Chest Port 1 View  07/11/2014   CLINICAL DATA:  Left-sided fluoroscopy wedge resection.  Postop.  EXAM: PORTABLE CHEST - 1 VIEW  COMPARISON:  1 day prior are  FINDINGS: Endotracheal tube terminates 2.7 cm above carina. Nasogastric tube extends beyond the inferior aspect of the thumb. Left internal jugular line terminates at the high SVC.  A left chest tube is new. Cardiomegaly accentuated by AP portable technique. Small bilateral pleural effusions are similar. No pneumothorax. Patchy bibasilar airspace disease is not significantly changed.  IMPRESSION: Appropriate position of support apparatus, including endotracheal tube 2.7 cm above carina.  No change in bibasilar airspace disease with probable small  bilateral pleural effusions.  No pneumothorax.  Report called to operating room 11:37 a.m.   Electronically Signed   By: Abigail Miyamoto M.D.   On: 06/23/2014 11:38     ASSESSMENT / PLAN:  PULMONARY A: Acute hypoxic respiratory failure with persistent pulmonary infiltrates. ARDS severe, now on HFOV Volume execess,  I>>O P: F/u VATS bx results HFOV protocol>> Power 10, Hz 6.0 Paw 35 IT 33  Amp 95 FIO2 1.0, leak on ett Sedation/NMB  protocol Hold lasix 5/29 IVF KVO F/u CXR q12h to assess diaphragm location Continue solumedrol   CARDIAC A: Hx of HTN. Pos volume P: Monitor hemodynamics Hold lasix 5/29 F/u bmet  RENAL A: Mild hyponatremia. Normal renal fxn P: F/u BMET  GASTRO A: Nutrition. P: NPO Protonix Will need TF soon  HEME A: No acute issues. P: F/u CBC  INFECTION A: ?HCAP,  All c/s from OLBx neg and stains neg. Cefepime/vanco started empirically 5/28 PM  P: Cont cefepime/vanco   ENDOCRINE A: Steroid induced hyperglycemia. P: SSI  NEURO A: On  NMB and sedative drips for HFOV P: RASS goal -5 and NMB protocol  CC timd 60  Minutes.  I will update spouse by phone or if she visits this AM face to face  Glyn Ade  [phone]  Cell  [phone]  If no response or cell goes to voicemail, call beeper [place] 07/14/2014, 8:32 AM

## 2014-07-14 NOTE — Progress Notes (Signed)
Critical ABG results reported to Dr. Elsworth Soho.

## 2014-07-14 NOTE — Progress Notes (Signed)
At mid-day HFOV assessment, adequate shoulder to groin wiggle noted and bilateral piston sounds were heard. Will begin slow HFOV wean per most recent ABG. RT will continue to monitor.

## 2014-07-15 ENCOUNTER — Inpatient Hospital Stay (HOSPITAL_COMMUNITY): Payer: 59

## 2014-07-15 LAB — POCT I-STAT 3, ART BLOOD GAS (G3+)
ACID-BASE EXCESS: 8 mmol/L — AB (ref 0.0–2.0)
ACID-BASE EXCESS: 5 mmol/L — AB (ref 0.0–2.0)
Acid-Base Excess: 4 mmol/L — ABNORMAL HIGH (ref 0.0–2.0)
Acid-Base Excess: 5 mmol/L — ABNORMAL HIGH (ref 0.0–2.0)
BICARBONATE: 32.2 meq/L — AB (ref 20.0–24.0)
Bicarbonate: 33 mEq/L — ABNORMAL HIGH (ref 20.0–24.0)
Bicarbonate: 33.8 mEq/L — ABNORMAL HIGH (ref 20.0–24.0)
Bicarbonate: 32.9 mEq/L — ABNORMAL HIGH (ref 20.0–24.0)
O2 SAT: 87 %
O2 SAT: 96 %
O2 Saturation: 79 %
O2 Saturation: 95 %
PO2 ART: 73 mmHg — AB (ref 80.0–100.0)
Patient temperature: 98.1
Patient temperature: 97.8
TCO2: 35 mmol/L (ref 0–100)
TCO2: 34 mmol/L (ref 0–100)
TCO2: 35 mmol/L (ref 0–100)
TCO2: 35 mmol/L (ref 0–100)
pCO2 arterial: 68.2 mmHg (ref 35.0–45.0)
pCO2 arterial: 53.9 mmHg — ABNORMAL HIGH (ref 35.0–45.0)
pCO2 arterial: 50.4 mmHg — ABNORMAL HIGH (ref 35.0–45.0)
pCO2 arterial: 59.9 mmHg (ref 35.0–45.0)
pH, Arterial: 7.291 — ABNORMAL LOW (ref 7.350–7.450)
pH, Arterial: 7.38 (ref 7.350–7.450)
pH, Arterial: 7.432 (ref 7.350–7.450)
pH, Arterial: 7.345 — ABNORMAL LOW (ref 7.350–7.450)
pO2, Arterial: 50 mmHg — ABNORMAL LOW (ref 80.0–100.0)
pO2, Arterial: 54 mmHg — ABNORMAL LOW (ref 80.0–100.0)
pO2, Arterial: 89 mmHg (ref 80.0–100.0)

## 2014-07-15 LAB — TISSUE CULTURE: Culture: NO GROWTH

## 2014-07-15 LAB — GLUCOSE, CAPILLARY
GLUCOSE-CAPILLARY: 178 mg/dL — AB (ref 65–99)
Glucose-Capillary: 152 mg/dL — ABNORMAL HIGH (ref 65–99)
Glucose-Capillary: 213 mg/dL — ABNORMAL HIGH (ref 65–99)
Glucose-Capillary: 195 mg/dL — ABNORMAL HIGH (ref 65–99)
Glucose-Capillary: 161 mg/dL — ABNORMAL HIGH (ref 65–99)
Glucose-Capillary: 176 mg/dL — ABNORMAL HIGH (ref 65–99)

## 2014-07-15 LAB — CBC
HCT: 38.8 % — ABNORMAL LOW (ref 39.0–52.0)
HEMOGLOBIN: 12.1 g/dL — AB (ref 13.0–17.0)
MCH: 28.3 pg (ref 26.0–34.0)
MCHC: 31.2 g/dL (ref 30.0–36.0)
MCV: 90.9 fL (ref 78.0–100.0)
PLATELETS: 137 10*3/uL — AB (ref 150–400)
RBC: 4.27 MIL/uL (ref 4.22–5.81)
RDW: 13.8 % (ref 11.5–15.5)
WBC: 12.2 10*3/uL — ABNORMAL HIGH (ref 4.0–10.5)

## 2014-07-15 LAB — BASIC METABOLIC PANEL
Anion gap: 8 (ref 5–15)
BUN: 59 mg/dL — AB (ref 6–20)
CALCIUM: 7.9 mg/dL — AB (ref 8.9–10.3)
CO2: 31 mmol/L (ref 22–32)
CREATININE: 1.27 mg/dL — AB (ref 0.61–1.24)
Chloride: 96 mmol/L — ABNORMAL LOW (ref 101–111)
GFR calc non Af Amer: >60 mL/min (ref >60)
Glucose, Bld: 232 mg/dL — ABNORMAL HIGH (ref 65–99)
Potassium: 4.8 mmol/L (ref 3.5–5.1)
Sodium: 135 mmol/L (ref 135–145)

## 2014-07-15 MED ORDER — FUROSEMIDE 10 MG/ML IJ SOLN
40.0000 mg | Freq: Four times a day (QID) | INTRAMUSCULAR | Status: AC
  Administered 2014-07-15 (×3): 40 mg via INTRAVENOUS
  Filled 2014-07-15 (×3): qty 4

## 2014-07-15 MED ORDER — VITAL AF 1.2 CAL PO LIQD
1000.0000 mL | ORAL | Status: DC
  Administered 2014-07-15: 1000 mL
  Filled 2014-07-15 (×7): qty 1000

## 2014-07-15 MED ORDER — VITAL HIGH PROTEIN PO LIQD
1000.0000 mL | ORAL | Status: DC
  Filled 2014-07-15 (×2): qty 1000

## 2014-07-15 NOTE — Progress Notes (Signed)
ABG results following adjustments to Oscillator. Ph: 7.38, CO2: 53.9, PO2: 54, HCO3: 32, Sats: 87%. No adjustments at this time. ABG for 5am ordered. RT will monitor.

## 2014-07-15 NOTE — Progress Notes (Signed)
Name: [name] MRN: [id number] DOB: 12-18-1955    ADMISSION DATE:  07/06/2014  CHIEF COMPLAINT:  Respiratory failure   BRIEF PATIENT DESCRIPTION:  59 yo male smoker has dyspnea, subjective fever since April 2016 with acute hypoxic respiratory failure and persistent pulmonary infiltrates.   SIGNIFICANT EVENTS  4/28 to 5/08 Admit to [facility] with PNA 5/19 Admit to [facility] 5/20 Transfer to [facility] 5/27 s/p VATS 5/28 ARDS severe, placed on NMB and HFOV 5/30 change to PCV, HFOV OFF  STUDIES:  4/28 CT chest >> Rt suprahilar LAN 2.3 cm, Rt hilar LAN 3.3 cm, Lt infrahilar LAN 2.6 cm, interlobular septal thickening and scattered GGO, patchy nodular infiltrates 5/20 CT chest >> progression of bilat patchy opacities with new Bilat lower lobe consolidation with air bronchograms.  NO change R hilar enlargement (?lymphadenopathy v mass).  5/20 Echo >> mild LVH, EF 60 to 65% 5/22 ACE >> 32 5/23 RF >> 8.8, ANCA negative  SUBJECTIVE:  Variable pressures on hfov. prob airway obstruction I>>O off lasix 5/29   VITAL SIGNS: Temp:  [96.3 F (35.7 C)-98.1 F (36.7 C)] 96.8 F (36 C) (05/30 0715) Pulse Rate:  [72-101] 82 (05/30 0813) Resp:  [0-99] 30 (05/30 0813) BP: (88-153)/(60-91) 120/65 mmHg (05/30 0813) SpO2:  [86 %-100 %] 99 % (05/30 0813) Arterial Line BP: (82-179)/(51-82) 116/64 mmHg (05/30 0715) FiO2 (%):  [70 %-100 %] 100 % (05/30 0813)  VENT SETTINGS: Vent Mode:  [-] PCV FiO2 (%):  [70 %-100 %] 100 % Set Rate:  [30 bmp] 30 bmp PEEP:  [12 cmH20] 12 cmH20 Delta P (Amplitude):  [10] 10 Mean Airway Pressure:  [31.5 cmH2O-40 cmH2O] 35.1 cmH2O Plateau Pressure:  [34 cmH20] 34 cmH20  Changed to pcv:  PC 23 Peep 12 I:E 1:1  fio2 1.0  Rate 30  Ti 1.04 Vee2- 4 autopeep 2  sats immediately rose from 95 to 100%.    PHYSICAL EXAMINATION: General: sedated RASS -5, NMB in place Neuro:  BIS 46, NMB in place, paralyzed HEENT: ETT in place Cardiovascular: regular Lungs:  On hfov not able to  auscult, no pcv: distant bs. Abdomen:  Soft, non tender. Good wiggle on hfov Musculoskeletal: no edema Skin: no rashes   CMP Latest Ref Rng 07/15/2014 07/14/2014 07/13/2014  Glucose 65 - 99 mg/dL 232(H) 254(H) 186(H)  BUN 6 - 20 mg/dL 59(H) 53(H) 45(H)  Creatinine 0.61 - 1.24 mg/dL 1.27(H) 1.32(H) 1.20  Sodium 135 - 145 mmol/L 135 133(L) 133(L)  Potassium 3.5 - 5.1 mmol/L 4.8 5.0 5.4(H)  Chloride 101 - 111 mmol/L 96(L) 97(L) 98(L)  CO2 22 - 32 mmol/L 31 31 -  Calcium 8.9 - 10.3 mg/dL 7.9(L) 8.1(L) -  Total Protein 6.5 - 8.1 g/dL - 6.6 -  Total Bilirubin 0.3 - 1.2 mg/dL - 0.6 -  Alkaline Phos 38 - 126 U/L - 76 -  AST 15 - 41 U/L - 30 -  ALT 17 - 63 U/L - 31 -    CBC Latest Ref Rng 07/15/2014 07/14/2014 07/13/2014  WBC 4.0 - 10.5 K/uL 12.2(H) 10.1 -  Hemoglobin 13.0 - 17.0 g/dL 12.1(L) 12.8(L) 16.7  Hematocrit 39.0 - 52.0 % 38.8(L) 41.0 49.0  Platelets 150 - 400 K/uL 137(L) 166 -    CBG (last 3)   Recent Labs  07/14/14 2334 07/15/14 0405 07/15/14 0745  GLUCAP 152* 213* 195*     Dg Chest Port 1 View  07/15/2014   CLINICAL DATA:  ARDS  EXAM: PORTABLE CHEST - 1  VIEW  COMPARISON:  07/14/2014  FINDINGS: Cardiomediastinal silhouette is stable. Left chest tube is unchanged in position. Stable endotracheal and NG tube position. Left IJ central line is unchanged in position. No pneumothorax. Persistent bilateral basilar infiltrates without change in aeration.  IMPRESSION: Stable support apparatus. Again noted bilateral basilar infiltrates. No significant change.   Electronically Signed   By: Lahoma Crocker M.D.   On: 07/15/2014 07:56   Dg Chest Port 1 View  07/14/2014   CLINICAL DATA:  Ventilation  EXAM: PORTABLE CHEST - 1 VIEW  COMPARISON:  07/14/2014  FINDINGS: Cardiac shadow is again enlarged. A left jugular central line is again seen without evidence of pneumothorax. A left chest tube is again noted in satisfactory position. The endotracheal tube is seen 6.8 cm above the carina. A  nasogastric catheter is noted within the stomach. Bibasilar infiltrates are again identified bilaterally stable from the prior exam. The diaphragm levels are stable at the tenth vertebra. This is similar to that seen on the prior exam.  IMPRESSION: Persistent bibasilar infiltrates.  Tubes and lines as described which are stable from the prior exam.   Electronically Signed   By: Inez Catalina M.D.   On: 07/14/2014 18:14   Dg Chest Port 1 View  07/14/2014   CLINICAL DATA:  Adult respiratory distress syndrome. Chest tube on left side. Pt is on an oscillator breathing machine.  EXAM: PORTABLE CHEST - 1 VIEW  COMPARISON:  07/13/2014  FINDINGS: No change in lower lung zone interstitial airspace opacities. Hemidiaphragms remain mostly obscured. There are probable small effusions. No new lung opacities.  No pneumothorax.  Left-sided chest tube, nasogastric tube, left internal jugular central venous line and endotracheal tube stable. Tip of the endotracheal tube projects lower, 5 cm above the carina, likely due to differences in patient positioning only.  IMPRESSION: 1. No significant change from the previous day's study. 2. Persistent lower lung zone interstitial and airspace opacities and probable small effusions.   Electronically Signed   By: Lajean Manes M.D.   On: 07/14/2014 07:35   Dg Chest Port 1 View  07/13/2014   CLINICAL DATA:  Acute respiratory failure with hypoxia  EXAM: PORTABLE CHEST - 1 VIEW  COMPARISON:  07/05/2014 at 1054 hours  FINDINGS: Patchy bilateral lower lobe opacities. Possible small bilateral pleural effusions.  Stable left apical chest tube.  No pneumothorax.  Cardiomegaly.  Endotracheal tube terminates 7 cm above the carina.  Stable left IJ venous catheter terminates in the mid SVC.  Enteric tube terminates in the proximal gastric body.  IMPRESSION: Endotracheal tube terminates 7 cm above the carina.  Patchy bilateral lower lobe opacities, unchanged. Possible small bilateral pleural  effusions.  Stable left apical chest tube.  No pneumothorax is seen.   Electronically Signed   By: Julian Hy M.D.   On: 07/13/2014 18:23   Dg Chest Port 1 View  07/13/2014   CLINICAL DATA:  Hypoxia  EXAM: PORTABLE CHEST - 1 VIEW  COMPARISON:  07/05/2014  FINDINGS: Cardiomediastinal silhouette is stable. Endotracheal tube with tip 5.5 cm above the carina. Stable left chest tube position. There is no pneumothorax. Stable NG tube position. Stable left IJ central line position. Persistent bilateral lower lobe atelectasis or infiltrate and small pleural effusion.  IMPRESSION: Stable left chest tube position. Stable support apparatus. Persistent bilateral lower lobe atelectasis or infiltrate and small pleural effusion. There is no pneumothorax.   Electronically Signed   By: Lahoma Crocker M.D.   On: 07/13/2014 14:53  ASSESSMENT / PLAN:  PULMONARY A: Acute hypoxic respiratory failure with persistent pulmonary infiltrates. ARDS severe, failed HFOV>>back to PCV and now improved with I:E 1:1  Volume execess,  I>>O, min response to lasix P: F/u VATS bx results Cont PCV mode Sedation/NMB  protocol Resume lasix 5/30  IVF KVO Continue solumedrol BDs    CARDIAC A: Hx of HTN. Pos volume P: Monitor hemodynamics Resume lasix F/u bmet  RENAL A: Mild hyperkalemia Mild hyponatremia. Normal renal fxn P: F/u BMET  GASTRO A: Nutrition. P: NPO Protonix Start TF  HEME A: No acute issues. P: F/u CBC  INFECTION A: ?HCAP,  All c/s from OLBx neg and stains neg. Cefepime/vanco started empirically 5/28 PM   P: Cont cefepime Stop vanco, no pos staph cults and rising Cr   ENDOCRINE A: Steroid induced hyperglycemia. P: SSI  NEURO A: On NMB and sedative drips for HFOV P: RASS goal -5 and NMB protocol  CC time  60  Minutes.  I will updated spouse by phone , plan to see her later face to face     Glyn Ade  [phone]  Cell  [phone]  If no  response or cell goes to voicemail, call beeper [place] 07/15/2014, 8:17 AM

## 2014-07-15 NOTE — Progress Notes (Signed)
RT assisted Xray tech with securing ETT/Oscillator circuit. Xray completed without complication.

## 2014-07-15 NOTE — Progress Notes (Signed)
3 Days Post-Op Procedure(s) (LRB): Left VIDEO ASSISTED THORACOSCOPY with wedge resection (Left) VIDEO BRONCHOSCOPY WITH ENDOBRONCHIAL ULTRASOUND (N/A) Subjective: sedated but off jet ventillator CXR w/o PNTX  Objective: Vital signs in last 24 hours: Temp:  [96.3 F (35.7 C)-98.1 F (36.7 C)] 97.6 F (36.4 C) (05/30 1115) Pulse Rate:  [72-101] 83 (05/30 1145) Cardiac Rhythm:  [-] Normal sinus rhythm (05/30 1145) Resp:  [0-99] 30 (05/30 1145) BP: (88-153)/(60-91) 127/71 mmHg (05/30 1130) SpO2:  [86 %-100 %] 96 % (05/30 1145) Arterial Line BP: (82-179)/(51-82) 145/73 mmHg (05/30 1145) FiO2 (%):  [70 %-100 %] 90 % (05/30 1145)  Hemodynamic parameters for last 24 hours:   stable Intake/Output from previous day: 05/29 0701 - 05/30 0700 In: 3696.9 [I.V.:3052.9; NG/GT:90; IV Piggyback:554] Out: 1855 [Urine:1415; Emesis/NG output:350; Chest Tube:90] Intake/Output this shift: Total I/O In: 553 [I.V.:473; NG/GT:30; IV Piggyback:50] Out: 925 [Urine:925]  No air leak from chest tube  Lab Results:  Recent Labs  07/14/14 0400 07/15/14 0412  WBC 10.1 12.2*  HGB 12.8* 12.1*  HCT 41.0 38.8*  PLT 166 137*   BMET:  Recent Labs  07/14/14 0400 07/15/14 0412  NA 133* 135  K 5.0 4.8  CL 97* 96*  CO2 31 31  GLUCOSE 254* 232*  BUN 53* 59*  CREATININE 1.32* 1.27*  CALCIUM 8.1* 7.9*    PT/INR: No results for input(s): LABPROT, INR in the last 72 hours. ABG    Component Value Date/Time   PHART 7.345* 07/15/2014 0849   HCO3 32.9* 07/15/2014 0849   TCO2 35 07/15/2014 0849   O2SAT 96.0 07/15/2014 0849   CBG (last 3)   Recent Labs  07/14/14 2334 07/15/14 0405 07/15/14 0745  GLUCAP 152* 213* 195*    Assessment/Plan: S/P Procedure(s) (LRB): Left VIDEO ASSISTED THORACOSCOPY with wedge resection (Left) VIDEO BRONCHOSCOPY WITH ENDOBRONCHIAL ULTRASOUND (N/A) Leave chest tube to suction   LOS: 10 days    Tharon Aquas Trigt III 07/15/2014

## 2014-07-15 NOTE — Progress Notes (Signed)
RT Note: Pt taken off HFOV, put on PC with MD @ bedside. Pt tolerating well at this time, SpO2 99%. RT will continue to monitor.

## 2014-07-15 NOTE — Progress Notes (Signed)
RT called to assess patient. MAP has dropped rapidy from 36 to the low 30's, Amplitude had increased from 100 to 144-145 rapidly. Cuff Leak was not a audible as earlier. RN stated patient has not been moved. Sedation had been continuous with no change. Max Inflate has been maintained by RT/RN. No changes were visible. Charge RT assessed with pausing Oscillator, BBS assessed. BBS Clear. Oscillator restarted. RT clamped off ETT and disconnected Oscillator to place suction catheter in place to check for a possible occluding. ETT unclamped, No secretions suctioned from ETT. Suction catheter removed and Oscillator therapy continued without complication. At that time all settings seemed to rapidly return to original settings and cuff leak was present again. RT assessed oral cavity and listened to neck for cuff leak. Everything appear normal at this time. Pt maintained throughout the changes. RT will report this to oncoming RT. RN aware.

## 2014-07-15 NOTE — Progress Notes (Signed)
ABG @ 0054: ph: 7.29,  CO2: 68.2, PAO2: 50, HCO3: 33, Sats: 79%. Adjustments made: 1cc air removed from ETT cuff. MAP dropped from 40 to 34. Sats increased from 90% to 96%. Amplitude increased from 96 to 100. ABG results/adjustments called to Dr. Halford Chessman. MD ok with results/adjustements. ABG reccommended for 0200. RT/RN will monitor.

## 2014-07-15 NOTE — Progress Notes (Signed)
CCM MD made aware after turning pt and taking CPR board from behind pt he had a runs of VT, first run 4 beats, then 13 beat run, and last 3 beat run. BP stable at this time, O2 sats 97%. No orders given at this time.  Will cont. To monitor and assess patient

## 2014-07-15 NOTE — Progress Notes (Addendum)
Nutrition Consult/Follow Up  DOCUMENTATION CODES:  Non-severe (moderate) malnutrition in context of acute illness/injury  INTERVENTION:  Tube feeding   Initiate Vital AF 1.2 formula at 20 ml/hr and increase by 10 ml every 4 hours to goal rate of 70 ml/hr   TF regimen to provide 2016 kcals, 126 gm protein, 1362 ml of free water  NUTRITION DIAGNOSIS:  Inadequate oral intake related to inability to eat as evidenced by NPO status, ongoing  GOAL:  Patient will meet greater than or equal to 90% of their needs, progressing  MONITOR:  Vent status, TF tolerance, Labs, Weight trends, I & O's  ASSESSMENT:  Per H&P, 59 yo male h/o HTN previously very healthy up until 2 months ago when he was diagnosed with walking PNA. Was treated as outpatient with antibiotics, worsened and presented to the ED over 2 weeks ago when he was hospitalized with acute hypoxid respiratory failure and bilateral PNA.Pt use to smoke 2-3 packs/day cigerettes for about 12 years, has quit due to this illness.  Patient s/p procedure 5/27: LEFT VIDEO ASSISTED THORACOSCOPY WEDGE RESECTION LUL  Patient is currently intubated on ventilator support -- OGT in place MV: 11.7 L/min Temp (24hrs), Avg:97.3 F (36.3 C), Min:96.3 F (35.7 C), Max:98.1 F (36.7 C)   RD consulted for TF initiation & management.  Height:  Ht Readings from Last 1 Encounters:  07/06/14 '6\' 1"'$  (1.854 m)    Weight:  Wt Readings from Last 1 Encounters:  07/13/14 225 lb 3.2 oz (102.15 kg)    Ideal Body Weight:  83.6 kg (kg)  Wt Readings from Last 10 Encounters:  07/13/14 225 lb 3.2 oz (102.15 kg)  07/03/14 233 lb (105.688 kg)  06/15/14 240 lb 4.8 oz (109 kg)  04/19/14 245 lb (111.131 kg)  04/04/14 240 lb (108.863 kg)  03/26/14 245 lb (111.131 kg)  03/13/14 245 lb (111.131 kg)  03/11/14 245 lb (111.131 kg)  07/23/13 251 lb (113.853 kg)  07/05/13 240 lb (108.863 kg)    BMI:  Body mass index is 29.72  kg/(m^2).  Estimated Nutritional Needs:  Kcal:  2094  Protein:  120-130 gm  Fluid:  per MD  Skin:  Reviewed, no issues  Diet Order:  Diet NPO time specified  EDUCATION NEEDS:  No education needs identified at this time   Intake/Output Summary (Last 24 hours) at 07/15/14 1018 Last data filed at 07/15/14 1000  Gross per 24 hour  Intake 3579.1 ml  Output   2350 ml  Net 1229.1 ml    Last BM:  Unknown   Percell Holms, RD, LDN Pager #: [phone] After-Hours Pager #: [phone]

## 2014-07-16 ENCOUNTER — Inpatient Hospital Stay (HOSPITAL_COMMUNITY): Payer: 59

## 2014-07-16 LAB — GLUCOSE, CAPILLARY
GLUCOSE-CAPILLARY: 274 mg/dL — AB (ref 65–99)
GLUCOSE-CAPILLARY: 230 mg/dL — AB (ref 65–99)
Glucose-Capillary: 206 mg/dL — ABNORMAL HIGH (ref 65–99)
Glucose-Capillary: 281 mg/dL — ABNORMAL HIGH (ref 65–99)
Glucose-Capillary: 243 mg/dL — ABNORMAL HIGH (ref 65–99)
Glucose-Capillary: 234 mg/dL — ABNORMAL HIGH (ref 65–99)
Glucose-Capillary: 155 mg/dL — ABNORMAL HIGH (ref 65–99)

## 2014-07-16 LAB — POCT I-STAT 3, ART BLOOD GAS (G3+)
ACID-BASE EXCESS: 8 mmol/L — AB (ref 0.0–2.0)
ACID-BASE EXCESS: 8 mmol/L — AB (ref 0.0–2.0)
ACID-BASE EXCESS: 12 mmol/L — AB (ref 0.0–2.0)
Bicarbonate: 39.3 mEq/L — ABNORMAL HIGH (ref 20.0–24.0)
Bicarbonate: 38.7 mEq/L — ABNORMAL HIGH (ref 20.0–24.0)
Bicarbonate: 43.7 mEq/L — ABNORMAL HIGH (ref 20.0–24.0)
O2 Saturation: 77 %
O2 Saturation: 82 %
O2 Saturation: 89 %
PCO2 ART: 85.8 mmHg — AB (ref 35.0–45.0)
PH ART: 7.261 — AB (ref 7.350–7.450)
PO2 ART: 57 mmHg — AB (ref 80.0–100.0)
Patient temperature: 98.6
TCO2: 42 mmol/L (ref 0–100)
TCO2: 41 mmol/L (ref 0–100)
TCO2: 46 mmol/L (ref 0–100)
pCO2 arterial: 86 mmHg (ref 35.0–45.0)
pCO2 arterial: 90.8 mmHg (ref 35.0–45.0)
pH, Arterial: 7.267 — ABNORMAL LOW (ref 7.350–7.450)
pH, Arterial: 7.289 — ABNORMAL LOW (ref 7.350–7.450)
pO2, Arterial: 49 mmHg — ABNORMAL LOW (ref 80.0–100.0)
pO2, Arterial: 66 mmHg — ABNORMAL LOW (ref 80.0–100.0)

## 2014-07-16 LAB — CBC
HCT: 37.9 % — ABNORMAL LOW (ref 39.0–52.0)
HEMOGLOBIN: 11.9 g/dL — AB (ref 13.0–17.0)
MCH: 28.6 pg (ref 26.0–34.0)
MCHC: 31.4 g/dL (ref 30.0–36.0)
MCV: 91.1 fL (ref 78.0–100.0)
Platelets: 127 10*3/uL — ABNORMAL LOW (ref 150–400)
RBC: 4.16 MIL/uL — ABNORMAL LOW (ref 4.22–5.81)
RDW: 13.8 % (ref 11.5–15.5)
WBC: 9.8 10*3/uL (ref 4.0–10.5)

## 2014-07-16 LAB — COMPREHENSIVE METABOLIC PANEL
ALBUMIN: 2 g/dL — AB (ref 3.5–5.0)
ALT: 21 U/L (ref 17–63)
AST: 21 U/L (ref 15–41)
Alkaline Phosphatase: 62 U/L (ref 38–126)
Anion gap: 11 (ref 5–15)
BUN: 70 mg/dL — ABNORMAL HIGH (ref 6–20)
CHLORIDE: 92 mmol/L — AB (ref 101–111)
CO2: 33 mmol/L — AB (ref 22–32)
Calcium: 7.9 mg/dL — ABNORMAL LOW (ref 8.9–10.3)
Creatinine, Ser: 1.41 mg/dL — ABNORMAL HIGH (ref 0.61–1.24)
GFR calc Af Amer: >60 mL/min (ref >60)
GFR, EST NON AFRICAN AMERICAN: 53 mL/min — AB (ref >60)
Glucose, Bld: 249 mg/dL — ABNORMAL HIGH (ref 65–99)
Potassium: 4.5 mmol/L (ref 3.5–5.1)
Sodium: 136 mmol/L (ref 135–145)
Total Bilirubin: 0.7 mg/dL (ref 0.3–1.2)
Total Protein: 5.4 g/dL — ABNORMAL LOW (ref 6.5–8.1)

## 2014-07-16 MED ORDER — METOPROLOL TARTRATE 25 MG/10 ML ORAL SUSPENSION
25.0000 mg | Freq: Two times a day (BID) | ORAL | Status: DC
  Administered 2014-07-16 – 2014-07-18 (×3): 25 mg
  Filled 2014-07-16 (×8): qty 10

## 2014-07-16 MED ORDER — INSULIN ASPART 100 UNIT/ML ~~LOC~~ SOLN
0.0000 [IU] | SUBCUTANEOUS | Status: DC
  Administered 2014-07-16 (×3): 7 [IU] via SUBCUTANEOUS
  Administered 2014-07-16 (×2): 11 [IU] via SUBCUTANEOUS
  Administered 2014-07-16: 4 [IU] via SUBCUTANEOUS
  Administered 2014-07-17 (×2): 3 [IU] via SUBCUTANEOUS
  Administered 2014-07-17 (×2): 4 [IU] via SUBCUTANEOUS
  Administered 2014-07-17: 3 [IU] via SUBCUTANEOUS
  Administered 2014-07-18: 4 [IU] via SUBCUTANEOUS
  Administered 2014-07-18: 3 [IU] via SUBCUTANEOUS
  Administered 2014-07-18 (×3): 4 [IU] via SUBCUTANEOUS
  Administered 2014-07-18: 11 [IU] via SUBCUTANEOUS
  Administered 2014-07-19: 7 [IU] via SUBCUTANEOUS
  Administered 2014-07-19 (×2): 3 [IU] via SUBCUTANEOUS

## 2014-07-16 MED ORDER — INSULIN GLARGINE 100 UNIT/ML ~~LOC~~ SOLN
20.0000 [IU] | Freq: Two times a day (BID) | SUBCUTANEOUS | Status: DC
Start: 2014-07-16 — End: 2014-07-17
  Administered 2014-07-16: 20 [IU] via SUBCUTANEOUS
  Filled 2014-07-16 (×3): qty 0.2

## 2014-07-16 MED ORDER — LABETALOL HCL 5 MG/ML IV SOLN
1.0000 mg/min | INTRAVENOUS | Status: DC
  Administered 2014-07-16 – 2014-07-17 (×2): 1 mg/min via INTRAVENOUS
  Filled 2014-07-16 (×2): qty 100

## 2014-07-16 MED ORDER — METHYLPREDNISOLONE SODIUM SUCC 125 MG IJ SOLR
80.0000 mg | Freq: Three times a day (TID) | INTRAMUSCULAR | Status: DC
  Administered 2014-07-16 – 2014-07-19 (×9): 80 mg via INTRAVENOUS
  Filled 2014-07-16: qty 1.28
  Filled 2014-07-16: qty 2
  Filled 2014-07-16 (×8): qty 1.28
  Filled 2014-07-16 (×2): qty 2

## 2014-07-16 NOTE — Progress Notes (Signed)
[facility] Progress Note Patient Name: [name] DOB: 1955-07-12 MRN: [id number]   Date of Service  07/16/2014  HPI/Events of Note  ABG on PC ventilation 4 hours post prone = 7.289/90/66 with 89% sat. O2 sat now = 91%. Oxygenation has improved from pre prone.   eICU Interventions  Continue present ventilatory management.      Intervention Category Major Interventions: Respiratory failure - evaluation and management  Martyna Thorns [place] 07/16/2014, 11:42 PM

## 2014-07-16 NOTE — Progress Notes (Signed)
CRITICAL VALUE ALERT  Critical value received:  ABG results from 2325 via iStat  Date of notification:  07/16/14  Time of notification:  2325  Critical value read back:Yes.    Nurse who received alert:  Wyline Beady, RN  MD notified (1st page):  Dr Quentin Ore  Time of first page:  2326  MD notified (2nd page):  Time of second page:  Responding MD:  Warren Lacy Dr Oletta Darter

## 2014-07-16 NOTE — Progress Notes (Signed)
Name: [name] MRN: [id number] DOB: Dec 01, 1955    ADMISSION DATE:  07/10/2014  CHIEF COMPLAINT:  Respiratory failure   BRIEF PATIENT DESCRIPTION:  59 yo male smoker has dyspnea, subjective fever since April 2016 with acute hypoxic respiratory failure and persistent pulmonary infiltrates.   SIGNIFICANT EVENTS  4/28 to 5/08 Admit to [facility] with PNA 5/19 Admit to [facility] 5/20 Transfer to [facility] 5/27 s/p VATS 5/28 ARDS severe, placed on NMB and HFOV 5/30 change to PCV  STUDIES:  4/28 CT chest >> Rt suprahilar LAN 2.3 cm, Rt hilar LAN 3.3 cm, Lt infrahilar LAN 2.6 cm, interlobular septal thickening and scattered GGO, patchy nodular infiltrates 5/20 CT chest >> progression of bilat patchy opacities with new Bilat lower lobe consolidation with air bronchograms.  NO change R hilar enlargement (?lymphadenopathy v mass).  5/20 Echo >> mild LVH, EF 60 to 65% 5/22 ACE >> 32 5/23 RF >> 8.8, ANCA negative 5/31 Cytology on EBUS specs all negative for malignancy  SUBJECTIVE:  Sedated, paralyzed, intubated  VITAL SIGNS: Temp:  [97.6 F (36.4 C)-98.4 F (36.9 C)] 98.4 F (36.9 C) (05/31 1145) Pulse Rate:  [80-107] 104 (05/31 1620) Resp:  [30-60] 33 (05/31 1620) BP: (95-200)/(60-116) 157/88 mmHg (05/31 1620) SpO2:  [87 %-100 %] 91 % (05/31 1620) Arterial Line BP: (94-213)/(54-110) 213/110 mmHg (05/31 1600) FiO2 (%):  [70 %-80 %] 80 % (05/31 1620) Weight:  [102.7 kg (226 lb 6.6 oz)] 102.7 kg (226 lb 6.6 oz) (05/31 0500)  VENT SETTINGS: Vent Mode:  [-] PCV FiO2 (%):  [70 %-80 %] 80 % Set Rate:  [30 bmp-33 bmp] 33 bmp PEEP:  [12 cmH20] 12 cmH20 Plateau Pressure:  [34 cmH20-35 cmH20] 34 cmH20    PHYSICAL EXAMINATION: General: Sedated, paralyzed, intubated Neuro:  Paralyzed HEENT: WNL Cardiovascular: Reg, no M Lungs: no wheezes Abdomen:  Soft, diminished BS Ext: no edema, warm   CMP Latest Ref Rng 07/16/2014 07/15/2014 07/14/2014  Glucose 65 - 99 mg/dL 249(H) 232(H) 254(H)  BUN 6  - 20 mg/dL 70(H) 59(H) 53(H)  Creatinine 0.61 - 1.24 mg/dL 1.41(H) 1.27(H) 1.32(H)  Sodium 135 - 145 mmol/L 136 135 133(L)  Potassium 3.5 - 5.1 mmol/L 4.5 4.8 5.0  Chloride 101 - 111 mmol/L 92(L) 96(L) 97(L)  CO2 22 - 32 mmol/L 33(H) 31 31  Calcium 8.9 - 10.3 mg/dL 7.9(L) 7.9(L) 8.1(L)  Total Protein 6.5 - 8.1 g/dL 5.4(L) - 6.6  Total Bilirubin 0.3 - 1.2 mg/dL 0.7 - 0.6  Alkaline Phos 38 - 126 U/L 62 - 76  AST 15 - 41 U/L 21 - 30  ALT 17 - 63 U/L 21 - 31    CBC Latest Ref Rng 07/16/2014 07/15/2014 07/14/2014  WBC 4.0 - 10.5 K/uL 9.8 12.2(H) 10.1  Hemoglobin 13.0 - 17.0 g/dL 11.9(L) 12.1(L) 12.8(L)  Hematocrit 39.0 - 52.0 % 37.9(L) 38.8(L) 41.0  Platelets 150 - 400 K/uL 127(L) 137(L) 166    CBG (last 3)   Recent Labs  07/16/14 0436 07/16/14 0733 07/16/14 1143  GLUCAP 274* 281* 243*     Dg Chest Port 1 View  07/16/2014   CLINICAL DATA:  Video-assisted thoracoscopy and wedge resection  EXAM: PORTABLE CHEST - 1 VIEW  COMPARISON:  Portable chest x-ray of Jul 15, 2014  FINDINGS: The lungs are adequately inflated. The interstitial markings remain increased bilaterally. The hemidiaphragms are slightly better demonstrated today. Small lateral pleural effusions persist. The left chest tube tip projects over the posterior aspect of the third rib  and is stable. No pneumothorax is evident. The cardiac silhouette is top-normal in size. The pulmonary vascularity is mildly engorged but more distinct today. The endotracheal tube tip projects 4.7 cm above the carina. The esophagogastric tube tip projects over the gastric cardia with the proximal port at or just below the GE junction. The left internal jugular venous catheter tip projects over the proximal SVC.  IMPRESSION: There has not been significant interval change in the appearance of the chest since the previous study. There are persistent bilateral interstitial infiltrates consistent with edema or pneumonia. There small bilateral pleural  effusions. There is no pneumothorax.   Electronically Signed   By: Fynlee Rowlands  [place] M.D.   On: 07/16/2014 07:29   Dg Chest Port 1 View  07/15/2014   CLINICAL DATA:  ARDS  EXAM: PORTABLE CHEST - 1 VIEW  COMPARISON:  07/14/2014  FINDINGS: Cardiomediastinal silhouette is stable. Left chest tube is unchanged in position. Stable endotracheal and NG tube position. Left IJ central line is unchanged in position. No pneumothorax. Persistent bilateral basilar infiltrates without change in aeration.  IMPRESSION: Stable support apparatus. Again noted bilateral basilar infiltrates. No significant change.   Electronically Signed   By: Lahoma Crocker M.D.   On: 07/15/2014 07:56   Dg Chest Port 1 View  07/14/2014   CLINICAL DATA:  Ventilation  EXAM: PORTABLE CHEST - 1 VIEW  COMPARISON:  07/14/2014  FINDINGS: Cardiac shadow is again enlarged. A left jugular central line is again seen without evidence of pneumothorax. A left chest tube is again noted in satisfactory position. The endotracheal tube is seen 6.8 cm above the carina. A nasogastric catheter is noted within the stomach. Bibasilar infiltrates are again identified bilaterally stable from the prior exam. The diaphragm levels are stable at the tenth vertebra. This is similar to that seen on the prior exam.  IMPRESSION: Persistent bibasilar infiltrates.  Tubes and lines as described which are stable from the prior exam.   Electronically Signed   By: Inez Catalina M.D.   On: 07/14/2014 18:14     ASSESSMENT / PLAN:  PULMONARY A: Acute hypoxic respiratory Pulmonary infiltrates of unclear etiology, s/p VATS 07/03/2014 Gas exchange seems out of proportion to CXR findings P: Cont full vent support - settings reviewed and/or adjusted Cont vent bundle Daily SBT if/when meets criteria Cont NMB - consider DC 6/01 Cont steroids - dose adjusted 5/31  CARDIAC A: Hx of HTN P: Monitor hemodynamics  RENAL A: AKI Mild hyperkalemia, resolved Mild hyponatremia,  resolved P: Monitor BMET intermittently Monitor I/Os Correct electrolytes as indicated  GASTRO A: NO issues P: SUP: IV PPI Cont TFs  HEME A: No acute issues. P: F/u CBC  INFECTION A:  Possible HCAP P: Cefepime 5/28 >>  Follow up micro to completion  ENDOCRINE A: Steroid induced hyperglycemia. P: Cont SSI Increase Lantus 5/31  NEURO A: On NMB and sedative drips  P: RASS goal: N/A while on NMB Cont BIS. Goal 40-60 Cont fent and midaz gtt Cont NMB 5/31   CCM X 40 mins  Merton Border, MD ; [facility] [phone].  After 5:30 PM or weekends, call [phone]

## 2014-07-16 NOTE — Progress Notes (Signed)
[facility] BRIEF SICU PROGRESS NOTE  4 Days Post-Op  S/P Procedure(s) (LRB): Left VIDEO ASSISTED THORACOSCOPY with wedge resection (Left) VIDEO BRONCHOSCOPY WITH ENDOBRONCHIAL ULTRASOUND (N/A)   Patient's respiratory status remains tenuous.  Hemodynamics stable although now on Labetalol for hypertension  Plan: Per Pulm/CCM team  [name] 07/16/2014 6:29 PM

## 2014-07-16 NOTE — Progress Notes (Addendum)
[facility] DAILY ICU PROGRESS NOTE                   [address]            [place] [address]          [phone]   4 Days Post-Op Procedure(s) (LRB): Left VIDEO ASSISTED THORACOSCOPY with wedge resection (Left) VIDEO BRONCHOSCOPY WITH ENDOBRONCHIAL ULTRASOUND (N/A)  Total Length of Stay:  LOS: 11 days   Subjective: Remains sedated on vent. Stable night.   Objective: Vital signs in last 24 hours: Temp:  [97.6 F (36.4 C)-98 F (36.7 C)] 97.7 F (36.5 C) (05/31 0736) Pulse Rate:  [80-107] 85 (05/31 0800) Cardiac Rhythm:  [-] Normal sinus rhythm (05/31 0800) Resp:  [30-60] 30 (05/31 0800) BP: (95-142)/(60-82) 108/64 mmHg (05/31 0800) SpO2:  [91 %-100 %] 94 % (05/31 0811) Arterial Line BP: (94-159)/(54-77) 117/62 mmHg (05/31 0800) FiO2 (%):  [70 %-100 %] 80 % (05/31 0811) Weight:  [226 lb 6.6 oz (102.7 kg)] 226 lb 6.6 oz (102.7 kg) (05/31 0500)  Filed Weights   07/03/2014 0540 07/13/14 0400 07/16/14 0500  Weight: 227 lb 4.7 oz (103.1 kg) 225 lb 3.2 oz (102.15 kg) 226 lb 6.6 oz (102.7 kg)    Weight change:    Hemodynamic parameters for last 24 hours:    Intake/Output from previous day: 05/30 0701 - 05/31 0700 In: 3614.5 [I.V.:2728.5; NG/GT:632; IV Piggyback:254] Out: 2130 [Urine:3075; Emesis/NG output:100; Chest Tube:100]  Intake/Output this shift: Total I/O In: -  Out: 110 [Urine:110]  Current Meds: Scheduled Meds: . acetaminophen  1,000 mg Oral 4 times per day   Or  . acetaminophen (TYLENOL) oral liquid 160 mg/5 mL  1,000 mg Oral 4 times per day  . albuterol  2.5 mg Nebulization Q4H  . antiseptic oral rinse  7 mL Mouth Rinse QID  . artificial tears  1 application Both Eyes 3 times per day  . bisacodyl  10 mg Oral Daily  . ceFEPime (MAXIPIME) IV  1 g Intravenous Q8H  . chlorhexidine  15 mL Mouth Rinse BID  . Chlorhexidine Gluconate Cloth  6 each Topical Daily  . enoxaparin (LOVENOX) injection  40 mg Subcutaneous Q24H  . fentaNYL (SUBLIMAZE)  injection  50 mcg Intravenous Once  . insulin aspart  0-20 Units Subcutaneous 6 times per day  . insulin glargine  14 Units Subcutaneous BID  . magic mouthwash  5 mL Oral TID PC & HS  . methylPREDNISolone (SOLU-MEDROL) injection  250 mg Intravenous Q12H  . metoprolol  5 mg Intravenous 4 times per day  . midazolam  2 mg Intravenous Once  . mupirocin ointment  1 application Nasal BID  . pantoprazole (PROTONIX) IV  40 mg Intravenous Q24H  . senna-docusate  1 tablet Oral QHS   Continuous Infusions: . sodium chloride 10 mL/hr at 07/14/14 0921  . cisatracurium (NIMBEX) infusion 4 mcg/kg/min (07/16/14 0406)  . feeding supplement (VITAL AF 1.2 CAL) 1,000 mL (07/16/14 0400)  . fentaNYL infusion INTRAVENOUS 300 mcg/hr (07/15/14 2200)  . midazolam (VERSED) infusion 5 mg/hr (07/16/14 0605)  . phenylephrine (NEO-SYNEPHRINE) Adult infusion Stopped (07/14/14 1830)  .  sodium bicarbonate  infusion 1000 mL 50 mL/hr at 07/16/14 0747   PRN Meds:.docusate, fentaNYL, metoprolol, midazolam, ondansetron (ZOFRAN) IV, potassium chloride   Physical Exam: General appearance: Sedated on vent Heart: regular rate and rhythm Lungs: Coarse bilateral BS Wound: Dressed and dry  Lab Results: CBC: Recent Labs  07/15/14 0412 07/16/14 8657  WBC 12.2* 9.8  HGB 12.1* 11.9*  HCT 38.8* 37.9*  PLT 137* 127*   BMET:  Recent Labs  07/15/14 0412 07/16/14 0523  NA 135 136  K 4.8 4.5  CL 96* 92*  CO2 31 33*  GLUCOSE 232* 249*  BUN 59* 70*  CREATININE 1.27* 1.41*  CALCIUM 7.9* 7.9*    PT/INR: No results for input(s): LABPROT, INR in the last 72 hours. Radiology: Dg Chest Port 1 View  07/16/2014   CLINICAL DATA:  Video-assisted thoracoscopy and wedge resection  EXAM: PORTABLE CHEST - 1 VIEW  COMPARISON:  Portable chest x-ray of Jul 15, 2014  FINDINGS: The lungs are adequately inflated. The interstitial markings remain increased bilaterally. The hemidiaphragms are slightly better demonstrated today. Small  lateral pleural effusions persist. The left chest tube tip projects over the posterior aspect of the third rib and is stable. No pneumothorax is evident. The cardiac silhouette is top-normal in size. The pulmonary vascularity is mildly engorged but more distinct today. The endotracheal tube tip projects 4.7 cm above the carina. The esophagogastric tube tip projects over the gastric cardia with the proximal port at or just below the GE junction. The left internal jugular venous catheter tip projects over the proximal SVC.  IMPRESSION: There has not been significant interval change in the appearance of the chest since the previous study. There are persistent bilateral interstitial infiltrates consistent with edema or pneumonia. There small bilateral pleural effusions. There is no pneumothorax.   Electronically Signed   By: David  Martinique M.D.   On: 07/16/2014 07:29     Assessment/Plan: S/P Procedure(s) (LRB): Left VIDEO ASSISTED THORACOSCOPY with wedge resection (Left) VIDEO BRONCHOSCOPY WITH ENDOBRONCHIAL ULTRASOUND (N/A) CT output decreasing, no air leak. Continue CTs for now. Vent management per pulm/CCM. Path pending, tissue cx negative. AKI- Cr up slightly today, UOP stable. Continue to watch.  COLLINS,GINA H 07/16/2014 8:39 AM  Agree with above assessment and plan leave chest tube to suction until extubated CXR personally reviewed

## 2014-07-16 NOTE — Progress Notes (Signed)
eLink Physician-Brief Progress Note Patient Name: [name] DOB: [birth year] MRN: [id number]   Date of Service  07/16/2014  HPI/Events of Note  Patient with worsening oxygenation.  eICU Interventions  Plan to prone patient Check abg 4 hrs after prone.     Intervention Category Major Interventions: Other:  Tanelle Lanzo 07/16/2014, 6:34 PM

## 2014-07-16 NOTE — Progress Notes (Signed)
Patient TF residuals have increased throughout the day from 25 mls this morning to 250 mls this afternoon. When feeding the 250 mls of TF back to patient, patient vomited and did not tolerate the feeding. TF was then stopped and Dr. Sharlene Dory was notified. Orders given to stop the tube feed and place OGT to LIS. Will continue to monitor.

## 2014-07-17 ENCOUNTER — Encounter (HOSPITAL_COMMUNITY): Payer: 59

## 2014-07-17 ENCOUNTER — Inpatient Hospital Stay (HOSPITAL_COMMUNITY): Payer: 59

## 2014-07-17 LAB — BLOOD GAS, ARTERIAL
ACID-BASE EXCESS: 10.2 mmol/L — AB (ref 0.0–2.0)
Acid-Base Excess: 9.6 mmol/L — ABNORMAL HIGH (ref 0.0–2.0)
Acid-Base Excess: 9.9 mmol/L — ABNORMAL HIGH (ref 0.0–2.0)
Acid-Base Excess: 10.9 mmol/L — ABNORMAL HIGH (ref 0.0–2.0)
BICARBONATE: 37.8 meq/L — AB (ref 20.0–24.0)
Bicarbonate: 38.3 mEq/L — ABNORMAL HIGH (ref 20.0–24.0)
Bicarbonate: 38.8 mEq/L — ABNORMAL HIGH (ref 20.0–24.0)
Bicarbonate: 38.2 mEq/L — ABNORMAL HIGH (ref 20.0–24.0)
DRAWN BY: 22766
Drawn by: 22766
FIO2: 100 %
FIO2: 100 %
FIO2: 1 %
FIO2: 1 %
LHR: 38 {breaths}/min
O2 SAT: 88.2 %
O2 Saturation: 86.2 %
O2 Saturation: 87 %
O2 Saturation: 85.9 %
PATIENT TEMPERATURE: 98.6
PCO2 ART: 102 mmHg — AB (ref 35.0–45.0)
PEEP/CPAP: 14 cmH2O
PEEP/CPAP: 14 cmH2O
PEEP/CPAP: 14 cmH2O
PEEP: 14 cmH2O
PO2 ART: 66.3 mmHg — AB (ref 80.0–100.0)
PRESSURE CONTROL: 23 cmH2O
Patient temperature: 98.6
Patient temperature: 98.6
Patient temperature: 98.6
Pressure control: 23 cmH2O
Pressure control: 23 cmH2O
Pressure control: 23 cmH2O
RATE: 33 resp/min
RATE: 38 resp/min
RATE: 38 resp/min
TCO2: 41.9 mmol/L (ref 0–100)
TCO2: 40.9 mmol/L (ref 0–100)
TCO2: 41.9 mmol/L (ref 0–100)
TCO2: 41.4 mmol/L (ref 0–100)
pCO2 arterial: 100 mmHg (ref 35.0–45.0)
pCO2 arterial: 102 mmHg (ref 35.0–45.0)
pH, Arterial: 7.149 — CL (ref 7.350–7.450)
pH, Arterial: 7.202 — ABNORMAL LOW (ref 7.350–7.450)
pH, Arterial: 7.206 — ABNORMAL LOW (ref 7.350–7.450)
pH, Arterial: 7.199 — CL (ref 7.350–7.450)
pO2, Arterial: 71.3 mmHg — ABNORMAL LOW (ref 80.0–100.0)
pO2, Arterial: 68 mmHg — ABNORMAL LOW (ref 80.0–100.0)
pO2, Arterial: 59.7 mmHg — ABNORMAL LOW (ref 80.0–100.0)

## 2014-07-17 LAB — CBC
HCT: 45.4 % (ref 39.0–52.0)
Hemoglobin: 13.5 g/dL (ref 13.0–17.0)
MCH: 28.4 pg (ref 26.0–34.0)
MCHC: 29.7 g/dL — ABNORMAL LOW (ref 30.0–36.0)
MCV: 95.6 fL (ref 78.0–100.0)
PLATELETS: 142 10*3/uL — AB (ref 150–400)
RBC: 4.75 MIL/uL (ref 4.22–5.81)
RDW: 13.8 % (ref 11.5–15.5)
WBC: 14.1 10*3/uL — AB (ref 4.0–10.5)

## 2014-07-17 LAB — GLUCOSE, CAPILLARY
Glucose-Capillary: 126 mg/dL — ABNORMAL HIGH (ref 65–99)
Glucose-Capillary: 136 mg/dL — ABNORMAL HIGH (ref 65–99)
Glucose-Capillary: 141 mg/dL — ABNORMAL HIGH (ref 65–99)
Glucose-Capillary: 149 mg/dL — ABNORMAL HIGH (ref 65–99)
Glucose-Capillary: 154 mg/dL — ABNORMAL HIGH (ref 65–99)
Glucose-Capillary: 155 mg/dL — ABNORMAL HIGH (ref 65–99)

## 2014-07-17 LAB — COMPREHENSIVE METABOLIC PANEL
ALBUMIN: 2.1 g/dL — AB (ref 3.5–5.0)
ALK PHOS: 76 U/L (ref 38–126)
ALT: 22 U/L (ref 17–63)
AST: 18 U/L (ref 15–41)
Anion gap: 8 (ref 5–15)
BUN: 74 mg/dL — AB (ref 6–20)
CALCIUM: 8.2 mg/dL — AB (ref 8.9–10.3)
CO2: 38 mmol/L — ABNORMAL HIGH (ref 22–32)
Chloride: 96 mmol/L — ABNORMAL LOW (ref 101–111)
Creatinine, Ser: 1.25 mg/dL — ABNORMAL HIGH (ref 0.61–1.24)
GFR calc Af Amer: >60 mL/min (ref >60)
GFR calc non Af Amer: >60 mL/min (ref >60)
Glucose, Bld: 167 mg/dL — ABNORMAL HIGH (ref 65–99)
Potassium: 5.8 mmol/L — ABNORMAL HIGH (ref 3.5–5.1)
SODIUM: 142 mmol/L (ref 135–145)
TOTAL PROTEIN: 5.6 g/dL — AB (ref 6.5–8.1)
Total Bilirubin: 0.6 mg/dL (ref 0.3–1.2)

## 2014-07-17 LAB — TROPONIN I: Troponin I: 0.1 ng/mL — ABNORMAL HIGH (ref <0.031)

## 2014-07-17 LAB — BRAIN NATRIURETIC PEPTIDE: B Natriuretic Peptide: 341.3 pg/mL — ABNORMAL HIGH (ref 0.0–100.0)

## 2014-07-17 MED ORDER — INSULIN GLARGINE 100 UNIT/ML ~~LOC~~ SOLN
10.0000 [IU] | Freq: Two times a day (BID) | SUBCUTANEOUS | Status: DC
  Administered 2014-07-17 – 2014-07-19 (×5): 10 [IU] via SUBCUTANEOUS
  Filled 2014-07-17 (×6): qty 0.1

## 2014-07-17 MED ORDER — VITAL AF 1.2 CAL PO LIQD
1000.0000 mL | ORAL | Status: DC
  Administered 2014-07-17: 1000 mL
  Filled 2014-07-17 (×2): qty 1000

## 2014-07-17 MED ORDER — FUROSEMIDE 10 MG/ML IJ SOLN
40.0000 mg | Freq: Once | INTRAMUSCULAR | Status: AC
  Administered 2014-07-17: 40 mg via INTRAVENOUS
  Filled 2014-07-17: qty 4

## 2014-07-17 NOTE — Progress Notes (Signed)
Name: [name] MRN: [id number] DOB: April 23, 1955    ADMISSION DATE:  06/25/2014  CHIEF COMPLAINT:  Respiratory failure   BRIEF PATIENT DESCRIPTION:  59 yo male smoker has dyspnea, subjective fever since April 2016 with acute hypoxic respiratory failure and persistent pulmonary infiltrates.   SIGNIFICANT EVENTS  4/28 to 5/08 Admit to [facility] with PNA 5/19 Admit to [facility] 5/20 Transfer to [facility] 5/27 s/p VATS 5/28 ARDS severe, placed on NMB and HFOV 5/30 change to PCV 5/31 Worsening gas exchange. Prone positioning  6/01 Episodic desaturations. Repositioned to supine  STUDIES:  4/28 CT chest >> Rt suprahilar LAN 2.3 cm, Rt hilar LAN 3.3 cm, Lt infrahilar LAN 2.6 cm, interlobular septal thickening and scattered GGO, patchy nodular infiltrates 5/20 CT chest >> progression of bilat patchy opacities with new Bilat lower lobe consolidation with air bronchograms.  NO change R hilar enlargement (?lymphadenopathy v mass).  5/20 Echo >> mild LVH, EF 60 to 65% 5/22 ACE >> 32 5/23 RF >> 8.8, ANCA negative 5/31 Cytology on EBUS specs all negative for malignancy  SUBJECTIVE:  Sedated, paralyzed, intubated. Surg path still pending  VITAL SIGNS: Temp:  [96.3 F (35.7 C)-99 F (37.2 C)] 97 F (36.1 C) (06/01 1552) Pulse Rate:  [57-93] 87 (06/01 1600) Resp:  [33-39] 38 (06/01 1600) BP: (94-135)/(57-91) 101/60 mmHg (06/01 1600) SpO2:  [84 %-98 %] 97 % (06/01 1600) Arterial Line BP: (83-155)/(49-84) 96/62 mmHg (06/01 1600) FiO2 (%):  [80 %-100 %] 100 % (06/01 1600)  VENT SETTINGS: Vent Mode:  [-] PCV FiO2 (%):  [80 %-100 %] 100 % Set Rate:  [33 bmp-38 bmp] 38 bmp PEEP:  [12 cmH20-14 cmH20] 14 cmH20 Plateau Pressure:  [32 cmH20-36 cmH20] 36 cmH20    PHYSICAL EXAMINATION: General: Sedated, paralyzed, intubated Neuro:  Paralyzed HEENT: WNL Cardiovascular: Reg, no M Lungs: few scattered wheezes Abdomen:  Soft, diminished BS Ext: no edema, warm   CMP Latest Ref Rng 07/17/2014  07/16/2014 07/15/2014  Glucose 65 - 99 mg/dL 167(H) 249(H) 232(H)  BUN 6 - 20 mg/dL 74(H) 70(H) 59(H)  Creatinine 0.61 - 1.24 mg/dL 1.25(H) 1.41(H) 1.27(H)  Sodium 135 - 145 mmol/L 142 136 135  Potassium 3.5 - 5.1 mmol/L 5.8(H) 4.5 4.8  Chloride 101 - 111 mmol/L 96(L) 92(L) 96(L)  CO2 22 - 32 mmol/L 38(H) 33(H) 31  Calcium 8.9 - 10.3 mg/dL 8.2(L) 7.9(L) 7.9(L)  Total Protein 6.5 - 8.1 g/dL 5.6(L) 5.4(L) -  Total Bilirubin 0.3 - 1.2 mg/dL 0.6 0.7 -  Alkaline Phos 38 - 126 U/L 76 62 -  AST 15 - 41 U/L 18 21 -  ALT 17 - 63 U/L 22 21 -    CBC Latest Ref Rng 07/17/2014 07/16/2014 07/15/2014  WBC 4.0 - 10.5 K/uL 14.1(H) 9.8 12.2(H)  Hemoglobin 13.0 - 17.0 g/dL 13.5 11.9(L) 12.1(L)  Hematocrit 39.0 - 52.0 % 45.4 37.9(L) 38.8(L)  Platelets 150 - 400 K/uL 142(L) 127(L) 137(L)    CBG (last 3)   Recent Labs  07/17/14 0347 07/17/14 0730 07/17/14 1130  GLUCAP 154* 136* 155*     Dg Chest Port 1 View  07/17/2014   CLINICAL DATA:  Hypoxia.  EXAM: PORTABLE CHEST - 1 VIEW  COMPARISON:  Jul 16, 2014  FINDINGS: Endotracheal tube tip is 7.7 cm above the carina. Central catheter tip is in the superior vena cava. Nasogastric tube tip is in the stomach with the side port at the gastroesophageal junction. There is a chest tube on the left. No  pneumothorax. There is extensive interstitial edema bilaterally, slightly increased. There is a small left effusion. There is no appreciable airspace consolidation. Heart is upper normal in size with pulmonary vascularity within normal limits.  IMPRESSION: Slight increase in interstitial edema overall. No pneumothorax. Note that the nasogastric tube side port is at the gastroesophageal junction. It may be prudent to consider advancing nasogastric tube 6-8 cm to insure that the side port is well within the stomach. Other tubes and catheters as described.   Electronically Signed   By: Lowella Grip III M.D.   On: 07/17/2014 07:56   Dg Chest Port 1 View  07/16/2014    CLINICAL DATA:  Video-assisted thoracoscopy and wedge resection  EXAM: PORTABLE CHEST - 1 VIEW  COMPARISON:  Portable chest x-ray of Jul 15, 2014  FINDINGS: The lungs are adequately inflated. The interstitial markings remain increased bilaterally. The hemidiaphragms are slightly better demonstrated today. Small lateral pleural effusions persist. The left chest tube tip projects over the posterior aspect of the third rib and is stable. No pneumothorax is evident. The cardiac silhouette is top-normal in size. The pulmonary vascularity is mildly engorged but more distinct today. The endotracheal tube tip projects 4.7 cm above the carina. The esophagogastric tube tip projects over the gastric cardia with the proximal port at or just below the GE junction. The left internal jugular venous catheter tip projects over the proximal SVC.  IMPRESSION: There has not been significant interval change in the appearance of the chest since the previous study. There are persistent bilateral interstitial infiltrates consistent with edema or pneumonia. There small bilateral pleural effusions. There is no pneumothorax.   Electronically Signed   By: Glennys Schorsch  Martinique M.D.   On: 07/16/2014 07:29     ASSESSMENT / PLAN:  PULMONARY A: Acute hypoxic respiratory Pulmonary infiltrates of unclear etiology, s/p VATS 06/18/2014 Wheezing 6/01 Gas exchange seems out of proportion to CXR findings P: Cont full vent support - settings reviewed and/or adjusted Cont vent bundle Daily SBT if/when meets criteria Cont steroids - dose adjusted 5/31 Add nebulized steroids and BDs 6/01  CARDIAC A: Hx of HTN P: Monitor hemodynamics  RENAL A: AKI Mild hyperkalemia, resolved Mild hyponatremia, resolved P: Monitor BMET intermittently Monitor I/Os Correct electrolytes as indicated  GASTRO A: High GVRs - TFs held 5/31 P: SUP: IV PPI Resume TFs @ 20 cc/hr 6/01  HEME A: No acute issues. P: DVT px: LMWH Monitor CBC  intermittently Transfuse per usual ICU guidelines  INFECTION A:  Possible HCAP P: Cefepime 5/28 >>  Follow up micro to completion PCT 6/02 ordered - consider DC abx if normal  ENDOCRINE A: Steroid induced hyperglycemia P: Cont SSI Lantus adjusted 6/01  NEURO A: On NMB and sedative drips  P: RASS goal: N/A while on NMB Cont BIS. Goal 40-60 Cont fent and midaz gtt Cont NMB 6/01   CCM X 40 mins  Merton Border, MD ; [facility] [phone].  After 5:30 PM or weekends, call [phone]

## 2014-07-17 NOTE — Progress Notes (Signed)
ANTIBIOTIC CONSULT NOTE - FOLLOW UP  Pharmacy Consult for Cefepime Indication: pneumonia  No Known Allergies  Patient Measurements: Height: '6\' 1"'$  (185.4 cm) Weight: 226 lb 6.6 oz (102.7 kg) IBW/kg (Calculated) : 79.9   Vital Signs: Temp: 97.5 F (36.4 C) (06/01 1134) Temp Source: Oral (06/01 1134) BP: 119/72 mmHg (06/01 1130) Pulse Rate: 83 (06/01 1130) Intake/Output from previous day: 05/31 0701 - 06/01 0700 In: 2631.8 [I.V.:1821.8; NG/GT:660; IV Piggyback:150] Out: 2090 [Urine:1600; Emesis/NG output:300; Chest Tube:190] Intake/Output from this shift: Total I/O In: 285.8 [I.V.:235.8; IV Piggyback:50] Out: 705 [Urine:705]  Labs:  Recent Labs  07/15/14 0412 07/16/14 0523 07/17/14 0345  WBC 12.2* 9.8 14.1*  HGB 12.1* 11.9* 13.5  PLT 137* 127* 142*  CREATININE 1.27* 1.41* 1.25*   Estimated Creatinine Clearance: 80.1 mL/min (by C-G formula based on Cr of 1.25). No results for input(s): VANCOTROUGH, VANCOPEAK, VANCORANDOM, GENTTROUGH, GENTPEAK, GENTRANDOM, TOBRATROUGH, TOBRAPEAK, TOBRARND, AMIKACINPEAK, AMIKACINTROU, AMIKACIN in the last 72 hours.   Microbiology: Recent Results (from the past 720 hour(s))  Culture, blood (routine x 2)     Status: None   Collection Time: 06/20/14  7:22 AM  Result Value Ref Range Status   Specimen Description BLOOD LEFT ANTECUBITAL  Final   Special Requests BOTTLES DRAWN AEROBIC ONLY 5CC  Final   Culture NO GROWTH 5 DAYS  Final   Report Status 06/25/2014 FINAL  Final  Culture, blood (routine x 2)     Status: None   Collection Time: 06/20/14  8:53 AM  Result Value Ref Range Status   Specimen Description BLOOD LEFT ARM  Final   Special Requests   Final    BOTTLES DRAWN AEROBIC AND ANAEROBIC AEB=12CC ANA=8CC   Culture NO GROWTH 5 DAYS  Final   Report Status 06/25/2014 FINAL  Final  Culture, blood (routine x 2)     Status: None   Collection Time: 07/06/2014 10:30 PM  Result Value Ref Range Status   Specimen Description BLOOD LEFT ARM   Final   Special Requests BOTTLES DRAWN AEROBIC AND ANAEROBIC 8CC EACH  Final   Culture NO GROWTH 5 DAYS  Final   Report Status 07/09/2014 FINAL  Final  Culture, blood (routine x 2)     Status: None   Collection Time: 06/24/2014 10:40 PM  Result Value Ref Range Status   Specimen Description BLOOD LEFT HAND  Final   Special Requests BOTTLES DRAWN AEROBIC AND ANAEROBIC 6CC EACH  Final   Culture NO GROWTH 5 DAYS  Final   Report Status 07/09/2014 FINAL  Final  MRSA PCR Screening     Status: None   Collection Time: 07/05/14  1:38 AM  Result Value Ref Range Status   MRSA by PCR NEGATIVE NEGATIVE Final    Comment:        The GeneXpert MRSA Assay (FDA approved for NASAL specimens only), is one component of a comprehensive MRSA colonization surveillance program. It is not intended to diagnose MRSA infection nor to guide or monitor treatment for MRSA infections.   Culture, sputum-assessment     Status: None   Collection Time: 07/05/14 11:22 AM  Result Value Ref Range Status   Specimen Description SPU  Final   Special Requests NONE  Final   Sputum evaluation   Final    MICROSCOPIC FINDINGS SUGGEST THAT THIS SPECIMEN IS NOT REPRESENTATIVE OF LOWER RESPIRATORY SECRETIONS. PLEASE RECOLLECT.   Report Status 07/06/2014 FINAL  Final  Surgical pcr screen     Status: Abnormal   Collection  Time: 07/11/14  8:01 PM  Result Value Ref Range Status   MRSA, PCR NEGATIVE NEGATIVE Final   Staphylococcus aureus POSITIVE (A) NEGATIVE Final    Comment:        The Xpert SA Assay (FDA approved for NASAL specimens in patients over 19 years of age), is one component of a comprehensive surveillance program.  Test performance has been validated by [facility] for patients greater than or equal to 29 year old. It is not intended to diagnose infection nor to guide or monitor treatment.   AFB culture with smear     Status: None (Preliminary result)   Collection Time: 07/07/2014  8:30 AM  Result Value Ref  Range Status   Specimen Description BRONCHIAL WASHINGS  Final   Special Requests BW RIGHT UPPER LOBE POF ZINACEF  Final   Acid Fast Smear   Final    NO ACID FAST BACILLI SEEN Performed at [facility]    Culture   Final    CULTURE WILL BE EXAMINED FOR 6 WEEKS BEFORE ISSUING A FINAL REPORT Performed at [facility]    Report Status PENDING  Incomplete  Culture, respiratory (NON-Expectorated)     Status: None   Collection Time: 06/19/2014  8:30 AM  Result Value Ref Range Status   Specimen Description BRONCHIAL WASHINGS  Final   Special Requests BW RIGHT UPPER LOBE POF ZINACEF  Final   Gram Stain   Final    RARE SQUAMOUS EPITHELIAL CELLS PRESENT NO WBC SEEN NO ORGANISMS SEEN Performed at [facility]    Culture   Final    Non-Pathogenic Oropharyngeal-type Flora Isolated. Performed at [facility]    Report Status 07/14/2014 FINAL  Final  Fungus Culture with Smear     Status: None (Preliminary result)   Collection Time: 07/06/2014  8:30 AM  Result Value Ref Range Status   Specimen Description BRONCHIAL WASHINGS  Final   Special Requests BW RIGHT UPPER LOBE POF ZINACEF  Final   Fungal Smear   Final    NO YEAST OR FUNGAL ELEMENTS SEEN Performed at [facility]    Culture   Final    CULTURE IN PROGRESS FOR FOUR WEEKS Performed at [facility]    Report Status PENDING  Incomplete  Fungus Culture with Smear     Status: None (Preliminary result)   Collection Time: 07/06/2014 10:45 AM  Result Value Ref Range Status   Specimen Description TISSUE LEFT LUNG  Final   Special Requests SPEC F PT ON ZINACEF  Final   Fungal Smear   Final    NO YEAST OR FUNGAL ELEMENTS SEEN Performed at [facility]    Culture   Final    CULTURE IN PROGRESS FOR FOUR WEEKS Performed at [facility]    Report Status PENDING  Incomplete  Tissue culture     Status: None   Collection Time: 06/16/2014 10:45 AM  Result Value Ref Range Status    Specimen Description TISSUE LEFT LUNG  Final   Special Requests SPEC F PT ON ZINACEF  Final   Gram Stain   Final    FEW WBC PRESENT, PREDOMINANTLY MONONUCLEAR NO ORGANISMS SEEN Performed at [facility]    Culture   Final    NO GROWTH 3 DAYS Performed at [facility]    Report Status 07/15/2014 FINAL  Final  AFB culture with smear     Status: None (Preliminary result)   Collection Time:  06/19/2014 10:45 AM  Result Value Ref Range Status   Specimen Description TISSUE LEFT LUNG  Final   Special Requests SPEC F PT ON ZINACEF  Final   Acid Fast Smear   Final    NO ACID FAST BACILLI SEEN Performed at [facility]    Culture   Final    CULTURE WILL BE EXAMINED FOR 6 WEEKS BEFORE ISSUING A FINAL REPORT Performed at [facility]    Report Status PENDING  Incomplete    Anti-infectives    Start     Dose/Rate Route Frequency Ordered Stop   07/14/14 0415  vancomycin (VANCOCIN) IVPB 750 mg/150 ml premix  Status:  Discontinued     750 mg 150 mL/hr over 60 Minutes Intravenous Every 12 hours 07/13/14 1605 07/15/14 0827   07/13/14 1615  ceFEPIme (MAXIPIME) 1 g in dextrose 5 % 50 mL IVPB     1 g 100 mL/hr over 30 Minutes Intravenous Every 8 hours 07/13/14 1605     07/13/14 1615  vancomycin (VANCOCIN) 2,000 mg in sodium chloride 0.9 % 500 mL IVPB     2,000 mg 250 mL/hr over 120 Minutes Intravenous  Once 07/13/14 1605 07/13/14 1942   07/11/2014 2000  cefUROXime (ZINACEF) 1.5 g in dextrose 5 % 50 mL IVPB  Status:  Discontinued     1.5 g 100 mL/hr over 30 Minutes Intravenous Every 12 hours 07/03/2014 1100 07/13/14 1605   07/11/2014 1115  vancomycin (VANCOCIN) IVPB 1000 mg/200 mL premix  Status:  Discontinued     1,000 mg 200 mL/hr over 60 Minutes Intravenous Every 12 hours 07/14/2014 1100 07/02/2014 1110   07/13/2014 1115  vancomycin (VANCOCIN) IVPB 1000 mg/200 mL premix  Status:  Discontinued     1,000 mg 200 mL/hr over 60 Minutes Intravenous Every 12 hours 06/19/2014 1110  07/15/2014 1129   07/07/2014 0600  cefUROXime (ZINACEF) 1.5 g in dextrose 5 % 50 mL IVPB    Comments:  60 minutes preop   1.5 g 100 mL/hr over 30 Minutes Intravenous To ShortStay Surgical 07/11/14 1223 07/02/2014 0800   07/10/14 1030  fluconazole (DIFLUCAN) tablet 100 mg  Status:  Discontinued     100 mg Oral Daily 07/10/14 1003 07/10/14 1014   07/10/14 1030  [MAR Hold]  fluconazole (DIFLUCAN) tablet 100 mg  Status:  Discontinued     (MAR Hold since 06/19/2014 0634)   100 mg Oral Daily 07/10/14 1014 07/11/2014 1100   07/07/14 1000  levofloxacin (LEVAQUIN) tablet 750 mg  Status:  Discontinued     750 mg Oral Daily 07/06/14 1219 07/09/14 1317   07/07/14 0900  levofloxacin (LEVAQUIN) IVPB 750 mg  Status:  Discontinued     750 mg 100 mL/hr over 90 Minutes Intravenous Every 24 hours 07/06/14 1155 07/06/14 1218   07/05/14 2100  vancomycin (VANCOCIN) 1,250 mg in sodium chloride 0.9 % 250 mL IVPB  Status:  Discontinued     1,250 mg 166.7 mL/hr over 90 Minutes Intravenous Every 12 hours 07/05/14 1104 07/06/14 1218   07/05/14 0900  vancomycin (VANCOCIN) 2,000 mg in sodium chloride 0.9 % 500 mL IVPB     2,000 mg 250 mL/hr over 120 Minutes Intravenous  Once 07/05/14 0757 07/05/14 1100   07/05/14 0900  levofloxacin (LEVAQUIN) IVPB 500 mg  Status:  Discontinued     500 mg 100 mL/hr over 60 Minutes Intravenous Every 24 hours 07/05/14 0813 07/06/14 1155   07/05/14 0600  ceFEPIme (MAXIPIME) 1 g in dextrose 5 %  50 mL IVPB  Status:  Discontinued     1 g 100 mL/hr over 30 Minutes Intravenous 3 times per day 07/05/14 0133 07/06/14 1218   07/10/2014 2330  vancomycin (VANCOCIN) 2,000 mg in sodium chloride 0.9 % 500 mL IVPB  Status:  Discontinued     2,000 mg 250 mL/hr over 120 Minutes Intravenous  Once 07/03/2014 2326 07/05/14 0133   06/22/2014 2300  ceFEPIme (MAXIPIME) 1 g in dextrose 5 % 50 mL IVPB     1 g 100 mL/hr over 30 Minutes Intravenous  Once 06/21/2014 2246 07/05/14 0051      Assessment: 59 yo male recently  treated for pna, discharged 06/23/14 now admitted 06/27/2014 with fever, SOB and suspected pna  5/28 : Reconsulted for Vanc + Cefepime for r/o HCAP after VATS on 5/27, persistent infiltrates, ARDS progression.   progressed and he presented to the ED >2 weeks ago with ARF and BL PNA- treated with broad spectrum abx and d/c'd to complete another 7 days of LVQ (also sent home on O2). Presented to [facility] on 5/20 with worsening SOB and worsening ARF. Mention of him having thrush- on magic mouthwash Afebrile,but hypothermic at 96.3,  wbc trending back up  *cefepime note due  Vanc 5/20>>5/21; 5/28 >>5/30 Cefepime 5/20>>5/21; 5/28 >> LVQ 5/20>>5/24 Zinacef ? >>5/28  5/19 BCx: NGTD 5/20, 5/27 sputum: not lower secretions, nl flora 5/27  Tissue L lung>> ngtd  Renal: SCr 1.25  (BL 1) CrCl ~82m/min. K 5.8  Goal of Therapy:  Renal adjustment of antibiotics.   Plan:  Continue cefepime 1g IV q8h, currently day 5.  Follow up SCr, UOP, cultures, clinical course and adjust as clinically indicated.   Thank you for allowing pharmacy to be a part of this patients care team.  JRowe RobertPharm.D., BCPS, AQ-Cardiology Clinical Pharmacist 07/17/2014 12:14 PM Pager: [phone]: [phone]

## 2014-07-17 NOTE — Care Management Note (Signed)
Case Management Note  Patient Details  Name: [name] MRN: [id number] Date of Birth: 02/05/1956  Subjective/Objective:                    Action/Plan:   Expected Discharge Date:                  Expected Discharge Plan:  Home/Self Care  In-House Referral:  NA  Discharge planning Services  CM Consult  Post Acute Care Choice:  NA Choice offered to:  NA  DME Arranged:    DME Agency:     HH Arranged:    HH Agency:     Status of Service:  In process, will continue to follow  Medicare Important Message Given:    Date Medicare IM Given:    Medicare IM give by:    Date Additional Medicare IM Given:    Additional Medicare Important Message give by:     If discussed at [place] of Stay Meetings, dates discussed:  07/16/2014  Additional Comments:  Delrae Sawyers, RN 07/17/2014, 9:07 AM

## 2014-07-17 NOTE — Plan of Care (Signed)
Problem: Phase II Progression Outcomes Goal: ADLs completed with minimal assistance Outcome: Not Progressing Paralyzed, sedated, on ventilator.

## 2014-07-17 NOTE — Progress Notes (Addendum)
TCTS DAILY ICU PROGRESS NOTE                   [address]            [place] [address]          [phone]   5 Days Post-Op Procedure(s) (LRB): Left VIDEO ASSISTED THORACOSCOPY with wedge resection (Left) VIDEO BRONCHOSCOPY WITH ENDOBRONCHIAL ULTRASOUND (N/A)  Total Length of Stay:  LOS: 12 days   Subjective: Remains on vent. No acute changes.   Objective: Vital signs in last 24 hours: Temp:  [96.3 F (35.7 C)-99 F (37.2 C)] 96.3 F (35.7 C) (06/01 0732) Pulse Rate:  [69-107] 69 (06/01 0800) Cardiac Rhythm:  [-] Normal sinus rhythm (06/01 0800) Resp:  [30-60] 38 (06/01 0800) BP: (94-200)/(58-116) 94/59 mmHg (06/01 0800) SpO2:  [85 %-98 %] 89 % (06/01 0800) Arterial Line BP: (83-213)/(49-110) 104/61 mmHg (06/01 0800) FiO2 (%):  [70 %-100 %] 100 % (06/01 0800)  Filed Weights   07/05/2014 0540 07/13/14 0400 07/16/14 0500  Weight: 227 lb 4.7 oz (103.1 kg) 225 lb 3.2 oz (102.15 kg) 226 lb 6.6 oz (102.7 kg)    Weight change:    Hemodynamic parameters for last 24 hours:    Intake/Output from previous day: 05/31 0701 - 06/01 0700 In: 2631.8 [I.V.:1821.8; NG/GT:660; IV Piggyback:150] Out: 2090 [Urine:1600; Emesis/NG output:300; Chest Tube:190]  Intake/Output this shift: Total I/O In: 97.4 [I.V.:47.4; IV Piggyback:50] Out: 115 [Urine:115]  Current Meds: Scheduled Meds: . acetaminophen  1,000 mg Oral 4 times per day   Or  . acetaminophen (TYLENOL) oral liquid 160 mg/5 mL  1,000 mg Oral 4 times per day  . antiseptic oral rinse  7 mL Mouth Rinse QID  . artificial tears  1 application Both Eyes 3 times per day  . bisacodyl  10 mg Oral Daily  . ceFEPime (MAXIPIME) IV  1 g Intravenous Q8H  . chlorhexidine  15 mL Mouth Rinse BID  . enoxaparin (LOVENOX) injection  40 mg Subcutaneous Q24H  . fentaNYL (SUBLIMAZE) injection  50 mcg Intravenous Once  . insulin aspart  0-20 Units Subcutaneous 6 times per day  . insulin glargine  20 Units Subcutaneous BID    . magic mouthwash  5 mL Oral TID PC & HS  . methylPREDNISolone (SOLU-MEDROL) injection  80 mg Intravenous 3 times per day  . metoprolol tartrate  25 mg Per Tube BID  . midazolam  2 mg Intravenous Once  . pantoprazole (PROTONIX) IV  40 mg Intravenous Q24H  . senna-docusate  1 tablet Oral QHS   Continuous Infusions: . sodium chloride 10 mL/hr at 07/17/14 0700  . cisatracurium (NIMBEX) infusion 3 mcg/kg/min (07/17/14 0800)  . feeding supplement (VITAL AF 1.2 CAL) Stopped (07/16/14 1700)  . fentaNYL infusion INTRAVENOUS 250 mcg/hr (07/17/14 0800)  . labetalol (NORMODYNE) infusion Stopped (07/17/14 0415)  . midazolam (VERSED) infusion 4 mg/hr (07/17/14 0800)   PRN Meds:.docusate, fentaNYL, metoprolol, midazolam, ondansetron (ZOFRAN) IV, potassium chloride   Physical Exam: General appearance: Sedated on vent Heart: regular rate and rhythm Lungs: rhonchi bilaterally Wound: Clean and dry Chest tube: No air leakl  Lab Results: CBC: Recent Labs  07/16/14 0523 07/17/14 0345  WBC 9.8 14.1*  HGB 11.9* 13.5  HCT 37.9* 45.4  PLT 127* 142*   BMET:  Recent Labs  07/16/14 0523 07/17/14 0345  NA 136 142  K 4.5 5.8*  CL 92* 96*  CO2 33* 38*  GLUCOSE 249* 167*  BUN 70*  74*  CREATININE 1.41* 1.25*  CALCIUM 7.9* 8.2*    PT/INR: No results for input(s): LABPROT, INR in the last 72 hours. Radiology: Dg Chest Port 1 View  07/17/2014   CLINICAL DATA:  Hypoxia.  EXAM: PORTABLE CHEST - 1 VIEW  COMPARISON:  Jul 16, 2014  FINDINGS: Endotracheal tube tip is 7.7 cm above the carina. Central catheter tip is in the superior vena cava. Nasogastric tube tip is in the stomach with the side port at the gastroesophageal junction. There is a chest tube on the left. No pneumothorax. There is extensive interstitial edema bilaterally, slightly increased. There is a small left effusion. There is no appreciable airspace consolidation. Heart is upper normal in size with pulmonary vascularity within normal  limits.  IMPRESSION: Slight increase in interstitial edema overall. No pneumothorax. Note that the nasogastric tube side port is at the gastroesophageal junction. It may be prudent to consider advancing nasogastric tube 6-8 cm to insure that the side port is well within the stomach. Other tubes and catheters as described.   Electronically Signed   By: Lowella Grip III M.D.   On: 07/17/2014 07:56     Assessment/Plan: S/P Procedure(s) (LRB): Left VIDEO ASSISTED THORACOSCOPY with wedge resection (Left) VIDEO BRONCHOSCOPY WITH ENDOBRONCHIAL ULTRASOUND (N/A) Continue CTs to suction while intubated.  Output decreasing.  Intraop cx's and cytology negative so far.  Path pending. Medical care per Pulm/CCM.  COLLINS,GINA H 07/17/2014 9:04 AM  patient examined and medical record reviewed,agree with above note. Tharon Aquas Trigt III 07/17/2014

## 2014-07-17 NOTE — Progress Notes (Signed)
eLink Physician-Brief Progress Note Patient Name: [name] DOB: 1955/02/16 MRN: [id number]   Date of Service  07/17/2014  HPI/Events of Note  Patient with worsening oxygenation, proned for about 20 hours.  eICU Interventions  Place back on supine position check ABG 2hrs after repositioning.      Intervention Category Intermediate Interventions: Respiratory distress - evaluation and management  Marquies Wanat 07/17/2014, 4:01 PM

## 2014-07-17 NOTE — Plan of Care (Signed)
Problem: Phase II Progression Outcomes Goal: O2 sats > equal to 90% on RA or at baseline Outcome: Not Progressing Patient maintained on 100 percent FiO2 and O2 saturation 86 percent per ABG blood draw.

## 2014-07-17 NOTE — Progress Notes (Signed)
RT preformed recruitment maneuver per MD order, pt tolerated well. RT placed pt back on ventilator settings. RN called MD with results of maneuver and MD instructed RT to increase peep to 14. RT will continue to monitor

## 2014-07-17 NOTE — Progress Notes (Signed)
[place] Progress Note Patient Name: SALVADORE VALVANO DOB: [birth year] MRN: [id number]   Date of Service  07/17/2014  HPI/Events of Note  Now proned. On 100%/PC 23/Rate 33/P 12 with sat = 85%.   eICU Interventions  Increase PEEP to 14.      Intervention Category Major Interventions: Respiratory failure - evaluation and management  Tyion Boylen Eugene 07/17/2014, 2:27 AM

## 2014-07-17 NOTE — Progress Notes (Signed)
Called to update Elink Dr Oletta Darter on patient status and decreasing O2 sat despite 100% FiO2. Vent change orders received and relayed to RT, will re-evaluate on morning ABG.

## 2014-07-17 NOTE — Progress Notes (Addendum)
Patient ID: [name], male   DOB: 11/03/1955, 59 y.o.   MRN: [id number] SICU Evening Rounds:  Hemodynamically stable  Remains on vent, paralyzed on 100% O2, peep 14.   Urine output ok  CT output low.  CCM managing.

## 2014-07-17 NOTE — Progress Notes (Signed)
[place] Progress Note Patient Name: [name] DOB: November 06, 1955 MRN: [id number]   Date of Service  07/17/2014  HPI/Events of Note  ABG on 100%/PC 23/Rate 33/P 14 = 7.14/>100/71.3  eICU Interventions  Will increase rate to 38 and re-check ABG at 6:30 AM.     Intervention Category Major Interventions: Respiratory failure - evaluation and management  Baldwin Racicot Eugene 07/17/2014, 6:01 AM

## 2014-07-17 DEATH — deceased

## 2014-07-18 ENCOUNTER — Other Ambulatory Visit (HOSPITAL_COMMUNITY): Payer: 59

## 2014-07-18 ENCOUNTER — Inpatient Hospital Stay (HOSPITAL_COMMUNITY): Payer: 59

## 2014-07-18 DIAGNOSIS — I469 Cardiac arrest, cause unspecified: Secondary | ICD-10-CM | POA: Insufficient documentation

## 2014-07-18 LAB — BLOOD GAS, ARTERIAL
ACID-BASE EXCESS: 2.3 mmol/L — AB (ref 0.0–2.0)
ACID-BASE EXCESS: 9.7 mmol/L — AB (ref 0.0–2.0)
Acid-Base Excess: 11.7 mmol/L — ABNORMAL HIGH (ref 0.0–2.0)
Acid-Base Excess: 11.6 mmol/L — ABNORMAL HIGH (ref 0.0–2.0)
Bicarbonate: 36 mEq/L — ABNORMAL HIGH (ref 20.0–24.0)
Bicarbonate: 31.3 mEq/L — ABNORMAL HIGH (ref 20.0–24.0)
Bicarbonate: 38.3 mEq/L — ABNORMAL HIGH (ref 20.0–24.0)
Bicarbonate: 39.8 mEq/L — ABNORMAL HIGH (ref 20.0–24.0)
FIO2: 0.7 %
FIO2: 100 %
FIO2: 1 %
FIO2: 1 %
HERTZ: 6
Hertz: 6
MAP: 35 cmH2O
Map: 35.1 cmH20
O2 SAT: 67.4 %
O2 Saturation: 78.7 %
O2 Saturation: 83 %
O2 Saturation: 70.4 %
PCO2 ART: 103 mmHg — AB (ref 35.0–45.0)
PCO2 ART: 108 mmHg — AB (ref 35.0–45.0)
PEEP/CPAP: 20 cmH2O
PEEP: 12 cmH2O
PH ART: 7.161 — AB (ref 7.350–7.450)
PO2 ART: 65.1 mmHg — AB (ref 80.0–100.0)
PO2 ART: 45.5 mmHg — AB (ref 80.0–100.0)
Patient temperature: 98.6
Patient temperature: 98.6
Patient temperature: 98.6
Patient temperature: 98.6
Pressure control: 23 cmH2O
RATE: 30 resp/min
TCO2: 37.5 mmol/L (ref 0–100)
TCO2: 34.5 mmol/L (ref 0–100)
TCO2: 41.8 mmol/L (ref 0–100)
TCO2: 43.1 mmol/L (ref 0–100)
pCO2 arterial: 49.2 mmHg — ABNORMAL HIGH (ref 35.0–45.0)
pH, Arterial: 7.477 — ABNORMAL HIGH (ref 7.350–7.450)
pH, Arterial: 7.108 — CL (ref 7.350–7.450)
pH, Arterial: 7.192 — CL (ref 7.350–7.450)
pO2, Arterial: 43.4 mmHg — ABNORMAL LOW (ref 80.0–100.0)
pO2, Arterial: 45.8 mmHg — ABNORMAL LOW (ref 80.0–100.0)

## 2014-07-18 LAB — POCT I-STAT 3, ART BLOOD GAS (G3+)
ACID-BASE EXCESS: 8 mmol/L — AB (ref 0.0–2.0)
Acid-Base Excess: 10 mmol/L — ABNORMAL HIGH (ref 0.0–2.0)
Acid-Base Excess: 6 mmol/L — ABNORMAL HIGH (ref 0.0–2.0)
Acid-Base Excess: 7 mmol/L — ABNORMAL HIGH (ref 0.0–2.0)
Bicarbonate: 42 mEq/L — ABNORMAL HIGH (ref 20.0–24.0)
Bicarbonate: 38.8 mEq/L — ABNORMAL HIGH (ref 20.0–24.0)
Bicarbonate: 38.5 mEq/L — ABNORMAL HIGH (ref 20.0–24.0)
Bicarbonate: 38.8 mEq/L — ABNORMAL HIGH (ref 20.0–24.0)
O2 Saturation: 82 %
O2 Saturation: 72 %
O2 Saturation: 77 %
O2 Saturation: 70 %
PCO2 ART: 90.2 mmHg — AB (ref 35.0–45.0)
PH ART: 7.274 — AB (ref 7.350–7.450)
PH ART: 7.196 — AB (ref 7.350–7.450)
PH ART: 7.249 — AB (ref 7.350–7.450)
PO2 ART: 50 mmHg — AB (ref 80.0–100.0)
Patient temperature: 98
Patient temperature: 98.8
TCO2: 45 mmol/L (ref 0–100)
TCO2: 42 mmol/L (ref 0–100)
TCO2: 41 mmol/L (ref 0–100)
TCO2: 41 mmol/L (ref 0–100)
pCO2 arterial: 88 mmHg (ref 35.0–45.0)
pCO2 arterial: 90 mmHg (ref 35.0–45.0)
pH, Arterial: 7.243 — ABNORMAL LOW (ref 7.350–7.450)
pO2, Arterial: 55 mmHg — ABNORMAL LOW (ref 80.0–100.0)
pO2, Arterial: 51 mmHg — ABNORMAL LOW (ref 80.0–100.0)
pO2, Arterial: 46 mmHg — ABNORMAL LOW (ref 80.0–100.0)

## 2014-07-18 LAB — GLUCOSE, CAPILLARY
GLUCOSE-CAPILLARY: 175 mg/dL — AB (ref 65–99)
GLUCOSE-CAPILLARY: 185 mg/dL — AB (ref 65–99)
Glucose-Capillary: 154 mg/dL — ABNORMAL HIGH (ref 65–99)
Glucose-Capillary: 195 mg/dL — ABNORMAL HIGH (ref 65–99)
Glucose-Capillary: 263 mg/dL — ABNORMAL HIGH (ref 65–99)
Glucose-Capillary: 217 mg/dL — ABNORMAL HIGH (ref 65–99)

## 2014-07-18 LAB — BASIC METABOLIC PANEL
Anion gap: 5 (ref 5–15)
BUN: 118 mg/dL — AB (ref 6–20)
CALCIUM: 8.5 mg/dL — AB (ref 8.9–10.3)
CO2: 37 mmol/L — AB (ref 22–32)
Chloride: 102 mmol/L (ref 101–111)
Creatinine, Ser: 1.65 mg/dL — ABNORMAL HIGH (ref 0.61–1.24)
GFR calc non Af Amer: 44 mL/min — ABNORMAL LOW (ref >60)
GFR, EST AFRICAN AMERICAN: 51 mL/min — AB (ref >60)
GLUCOSE: 209 mg/dL — AB (ref 65–99)
POTASSIUM: 6 mmol/L — AB (ref 3.5–5.1)
Sodium: 144 mmol/L (ref 135–145)

## 2014-07-18 LAB — COMPREHENSIVE METABOLIC PANEL
ALT: 22 U/L (ref 17–63)
AST: 23 U/L (ref 15–41)
Albumin: 2.2 g/dL — ABNORMAL LOW (ref 3.5–5.0)
Alkaline Phosphatase: 101 U/L (ref 38–126)
Anion gap: 9 (ref 5–15)
BUN: 95 mg/dL — ABNORMAL HIGH (ref 6–20)
CO2: 36 mmol/L — AB (ref 22–32)
CREATININE: 1.39 mg/dL — AB (ref 0.61–1.24)
Calcium: 8.5 mg/dL — ABNORMAL LOW (ref 8.9–10.3)
Chloride: 97 mmol/L — ABNORMAL LOW (ref 101–111)
GFR calc Af Amer: >60 mL/min (ref >60)
GFR calc non Af Amer: 54 mL/min — ABNORMAL LOW (ref >60)
Glucose, Bld: 178 mg/dL — ABNORMAL HIGH (ref 65–99)
POTASSIUM: 6 mmol/L — AB (ref 3.5–5.1)
Sodium: 142 mmol/L (ref 135–145)
Total Bilirubin: 0.5 mg/dL (ref 0.3–1.2)
Total Protein: 6.3 g/dL — ABNORMAL LOW (ref 6.5–8.1)

## 2014-07-18 LAB — CBC
HCT: 48.6 % (ref 39.0–52.0)
Hemoglobin: 13.9 g/dL (ref 13.0–17.0)
MCH: 28 pg (ref 26.0–34.0)
MCHC: 28.6 g/dL — ABNORMAL LOW (ref 30.0–36.0)
MCV: 97.8 fL (ref 78.0–100.0)
PLATELETS: 120 10*3/uL — AB (ref 150–400)
RBC: 4.97 MIL/uL (ref 4.22–5.81)
RDW: 13.6 % (ref 11.5–15.5)
WBC: 14.1 10*3/uL — ABNORMAL HIGH (ref 4.0–10.5)

## 2014-07-18 LAB — PROCALCITONIN: PROCALCITONIN: 0.35 ng/mL

## 2014-07-18 MED ORDER — VASOPRESSIN 20 UNIT/ML IV SOLN
0.0300 [IU]/min | INTRAVENOUS | Status: DC
  Administered 2014-07-18: 0.03 [IU]/min via INTRAVENOUS
  Filled 2014-07-18 (×2): qty 2

## 2014-07-18 MED ORDER — NOREPINEPHRINE BITARTRATE 1 MG/ML IV SOLN
0.0000 ug/min | INTRAVENOUS | Status: DC
  Administered 2014-07-18: 40 ug/min via INTRAVENOUS
  Administered 2014-07-18: 26 ug/min via INTRAVENOUS
  Filled 2014-07-18 (×3): qty 16

## 2014-07-18 MED ORDER — AMIODARONE HCL IN DEXTROSE 360-4.14 MG/200ML-% IV SOLN
30.0000 mg/h | INTRAVENOUS | Status: DC
  Filled 2014-07-18: qty 200

## 2014-07-18 MED ORDER — AMIODARONE HCL IN DEXTROSE 360-4.14 MG/200ML-% IV SOLN
INTRAVENOUS | Status: AC
  Filled 2014-07-18: qty 200

## 2014-07-18 MED ORDER — SODIUM POLYSTYRENE SULFONATE 15 GM/60ML PO SUSP
45.0000 g | Freq: Once | ORAL | Status: AC
  Administered 2014-07-18: 45 g via RECTAL
  Filled 2014-07-18: qty 180

## 2014-07-18 MED ORDER — STERILE WATER FOR INJECTION IV SOLN
INTRAVENOUS | Status: DC
  Administered 2014-07-18 – 2014-07-19 (×3): via INTRAVENOUS
  Filled 2014-07-18 (×4): qty 850

## 2014-07-18 MED ORDER — SODIUM BICARBONATE 8.4 % IV SOLN
INTRAVENOUS | Status: AC
  Administered 2014-07-18: 50 meq
  Filled 2014-07-18: qty 50

## 2014-07-18 MED ORDER — FUROSEMIDE 10 MG/ML IJ SOLN
40.0000 mg | Freq: Two times a day (BID) | INTRAMUSCULAR | Status: DC
  Administered 2014-07-18: 40 mg via INTRAVENOUS
  Filled 2014-07-18 (×3): qty 4

## 2014-07-18 MED ORDER — FREE WATER
200.0000 mL | Freq: Three times a day (TID) | Status: DC
  Administered 2014-07-18 – 2014-07-19 (×2): 200 mL

## 2014-07-18 MED ORDER — ADENOSINE 6 MG/2ML IV SOLN
INTRAVENOUS | Status: AC
  Filled 2014-07-18: qty 8

## 2014-07-18 MED ORDER — SODIUM POLYSTYRENE SULFONATE 15 GM/60ML PO SUSP
30.0000 g | Freq: Once | ORAL | Status: AC
  Administered 2014-07-18: 30 g
  Filled 2014-07-18: qty 120

## 2014-07-18 MED ORDER — SODIUM BICARBONATE 8.4 % IV SOLN
50.0000 meq | Freq: Once | INTRAVENOUS | Status: AC
  Administered 2014-07-18: 50 meq via INTRAVENOUS
  Filled 2014-07-18 (×2): qty 50

## 2014-07-18 MED ORDER — AMIODARONE HCL IN DEXTROSE 360-4.14 MG/200ML-% IV SOLN
60.0000 mg/h | INTRAVENOUS | Status: AC
  Administered 2014-07-18 (×2): 60 mg/h via INTRAVENOUS
  Filled 2014-07-18: qty 200

## 2014-07-18 MED ORDER — PHENYLEPHRINE HCL 10 MG/ML IJ SOLN
30.0000 ug/min | INTRAVENOUS | Status: DC
  Filled 2014-07-18: qty 1

## 2014-07-18 NOTE — Progress Notes (Signed)
   07/18/14 1900  Clinical Encounter Type  Visited With Patient;Family;Health care provider  Visit Type Initial;Spiritual support;Social support;Critical Care  Spiritual Encounters  Spiritual Needs Emotional  Stress Factors  Family Stress Factors Health changes;Major life changes   Chaplain was paged to the patient's room at 6:02 PM. Nurses on unit were aware that the patient's family had been through a lot today and may need support. Chaplain introduced himself to the patient's wife. Several other family and friends were present but they all seemed mainly there to support the wife. Patient's wife said that she had to make some difficult choices regarding the patient's care but simply does not want to let him suffer anymore. This message from the patient's wife seemed congruent with the patient's nurse who explained that the wife had been accepting of the patient's serious illness earlier on the week. Patient's wife has some worry about how the rest of her family will perceive her decision to withdraw the life-prolonging care the patient is receiving. However, most of the family and friends present seemed supportive of this decision so far. Patient's daughter was en route to the hospital while chaplain was present and does not yet know that the patient coded earlier today. Patient's wife wishes at this point that the patient no longer suffer and also expressed wishes to see the patient awake and talking one more time. Chaplain affirmed the decisions the patient's wife has had to make regarding patient's care. Patient's family appears to have some clergy/pastoral support from their local church, although it was unclear if they were coming in tonight or just providing support via phone. Chaplain will continue to provide emotional and spiritual support for patient and patient's family as needed. Page On-call chaplain tonight if patient or patient's family has further support needs.  Clover Feehan, Claudius Sis,  Chaplain  7:31 PM

## 2014-07-18 NOTE — Progress Notes (Signed)
[place] Progress Note Patient Name: CROCKETT RALLO DOB: 09/03/1955 MRN: [id number]   Date of Service  07/18/2014  HPI/Events of Note  K+ = 6.0 this AM (up from 5.8). ABG = 7.27/90/55 with sat = 82%. Sat from pulse oximetry = 98%  eICU Interventions  Will order: 1. Kayexalate 30 gm via NGT now.  2. Continue present ventilator management.      Intervention Category Intermediate Interventions: Electrolyte abnormality - evaluation and management  Sommer,Steven Eugene 07/18/2014, 6:00 AM

## 2014-07-18 NOTE — Progress Notes (Signed)
      [address]       [place] [address]             [phone]      Sedated on jet ventilator  BP 104/59 mmHg  Pulse 122  Temp(Src) 98.8 F (37.1 C) (Axillary)  Resp 24  Ht '6\' 1"'$  (1.854 m)  Wt 230 lb 13.2 oz (104.7 kg)  BMI 30.46 kg/m2  SpO2 100%   Intake/Output Summary (Last 24 hours) at 07/18/14 1846 Last data filed at 07/18/14 1300  Gross per 24 hour  Intake 1812.1 ml  Output   1245 ml  Net  465.6 ml   asystolic arrest earlier today. Resuscitated.  Remains critically ill  Revonda Standard. Roxan Hockey, MD [facility] [phone]

## 2014-07-18 NOTE — Progress Notes (Addendum)
TCTS DAILY ICU PROGRESS NOTE                   [address]            [place] [address]          [phone]   6 Days Post-Op Procedure(s) (LRB): Left VIDEO ASSISTED THORACOSCOPY with wedge resection (Left) VIDEO BRONCHOSCOPY WITH ENDOBRONCHIAL ULTRASOUND (N/A)  Total Length of Stay:  LOS: 13 days   Subjective: Sedated on vent. No acute changes.   Objective: Vital signs in last 24 hours: Temp:  [96.8 F (36 C)-98.2 F (36.8 C)] 96.8 F (36 C) (06/02 0730) Pulse Rate:  [57-119] 113 (06/02 0730) Cardiac Rhythm:  [-] Sinus tachycardia (06/02 0737) Resp:  [20-42] 38 (06/02 0730) BP: (99-144)/(57-91) 138/80 mmHg (06/02 0730) SpO2:  [84 %-99 %] 94 % (06/02 0730) Arterial Line BP: (85-282)/(49-243) 120/79 mmHg (06/02 0730) FiO2 (%):  [100 %] 100 % (06/02 0600) Weight:  [230 lb 13.2 oz (104.7 kg)] 230 lb 13.2 oz (104.7 kg) (06/02 0423)  Filed Weights   07/13/14 0400 07/16/14 0500 07/18/14 0423  Weight: 225 lb 3.2 oz (102.15 kg) 226 lb 6.6 oz (102.7 kg) 230 lb 13.2 oz (104.7 kg)    Weight change:    Hemodynamic parameters for last 24 hours:    Intake/Output from previous day: 06/01 0701 - 06/02 0700 In: 2027.9 [I.V.:1407.9; NG/GT:470; IV Piggyback:150] Out: 2330 [Urine:2080; Chest Tube:250]  Intake/Output this shift: Total I/O In: -  Out: 75 [Urine:75]  Current Meds: Scheduled Meds: . antiseptic oral rinse  7 mL Mouth Rinse QID  . artificial tears  1 application Both Eyes 3 times per day  . bisacodyl  10 mg Oral Daily  . ceFEPime (MAXIPIME) IV  1 g Intravenous Q8H  . chlorhexidine  15 mL Mouth Rinse BID  . enoxaparin (LOVENOX) injection  40 mg Subcutaneous Q24H  . fentaNYL (SUBLIMAZE) injection  50 mcg Intravenous Once  . insulin aspart  0-20 Units Subcutaneous 6 times per day  . insulin glargine  10 Units Subcutaneous BID  . magic mouthwash  5 mL Oral TID PC & HS  . methylPREDNISolone (SOLU-MEDROL) injection  80 mg Intravenous 3 times per  day  . metoprolol tartrate  25 mg Per Tube BID  . midazolam  2 mg Intravenous Once  . pantoprazole (PROTONIX) IV  40 mg Intravenous Q24H  . senna-docusate  1 tablet Oral QHS   Continuous Infusions: . sodium chloride 10 mL/hr at 07/18/14 0700  . cisatracurium (NIMBEX) infusion 3.5 mcg/kg/min (07/18/14 0700)  . feeding supplement (VITAL AF 1.2 CAL) 1,000 mL (07/18/14 0700)  . fentaNYL infusion INTRAVENOUS 250 mcg/hr (07/18/14 0700)  . labetalol (NORMODYNE) infusion Stopped (07/17/14 0415)  . midazolam (VERSED) infusion 4 mg/hr (07/18/14 0700)   PRN Meds:.docusate, fentaNYL, metoprolol, midazolam, ondansetron (ZOFRAN) IV, potassium chloride  Physical Exam: General appearance: Sedated on vent Heart: regular rate and rhythm, tachy 110s Lungs: Clearer today, no wheezes Wound: Clean and dry Chest tube: No air leak  Lab Results: CBC: Recent Labs  07/17/14 0345 07/18/14 0430  WBC 14.1* 14.1*  HGB 13.5 13.9  HCT 45.4 48.6  PLT 142* 120*   BMET:  Recent Labs  07/17/14 0345 07/18/14 0430  NA 142 142  K 5.8* 6.0*  CL 96* 97*  CO2 38* 36*  GLUCOSE 167* 178*  BUN 74* 95*  CREATININE 1.25* 1.39*  CALCIUM 8.2* 8.5*    PT/INR: No results for input(s): LABPROT,  INR in the last 72 hours. Radiology: Dg Chest Port 1 View  07/18/2014   CLINICAL DATA:  Followup respiratory failure  EXAM: PORTABLE CHEST - 1 VIEW  COMPARISON:  07/17/2014  FINDINGS: Cardiac shadow is stable. Endotracheal tube, nasogastric catheter and left jugular central line are again seen and stable. Diffuse bilateral airspace disease is again seen and stable. No new focal abnormality is noted. No bony abnormality is seen.  IMPRESSION: Stable appearance of the chest from the previous day.   Electronically Signed   By: Inez Catalina M.D.   On: 07/18/2014 07:29   Dg Chest Port 1 View  07/17/2014   CLINICAL DATA:  Respiratory failure.  EXAM: PORTABLE CHEST - 1 VIEW  COMPARISON:  Film earlier today at 0554 hours  FINDINGS:  Endotracheal tube remains with the tip approximately 4 cm above the carina. Left jugular central line tip shows stable positioning in the upper SVC. A single left-sided chest tube remains. No pneumothorax identified. A nasogastric tube extends below the diaphragm.  Overall degree of pulmonary infiltrates appears slightly improved in the left upper lung. Bilateral lower lung zone airspace disease appears stable. Stable bilateral hilar soft tissue prominence, right greater than left.  IMPRESSION: Some improvement in aeration of the left upper lobe. Otherwise fairly stable bilateral pulmonary airspace disease without pneumothorax.   Electronically Signed   By: Aletta Edouard M.D.   On: 07/17/2014 21:19     Assessment/Plan: S/P Procedure(s) (LRB): Left VIDEO ASSISTED THORACOSCOPY with wedge resection (Left) VIDEO BRONCHOSCOPY WITH ENDOBRONCHIAL ULTRASOUND (N/A) Path remains pending, intraop cx's negative to date.  CT output 250 ml serous drainage over past 24 hours.  Continue CT to suction while intubated. Continue present management per primary service.  Gurney Balthazor H 07/18/2014 8:34 AM patient examined and medical record reviewed,agree with above note. Tharon Aquas Trigt III 07/18/2014

## 2014-07-18 NOTE — Progress Notes (Signed)
CRITICAL VALUE ALERT  Critical value received:  Abg from 4961  Date of notification:  istat machine   Time of notification:  1643  Critical value read back:Yes.    Nurse who received alert:  Wyline Beady  MD notified (1st page):  Elink Dr Oletta Darter  Time of first page:  [phone]

## 2014-07-18 NOTE — Progress Notes (Signed)
   07/18/14 1704  Clinical Encounter Type  Visited With Family  Visit Type Code  Spiritual Encounters  Spiritual Needs Emotional;Grief support  CH Respond to Code.  Emotional support for family during code as needed.

## 2014-07-18 NOTE — Procedures (Signed)
CPR  Called with patient in asystole.  ACLS protocol followed.  Changed to VT and was shocked x3 and amio given.  Hyperkalemia treated.  ROSC with levophed on board.  Please see code sheet for details.  Rush Farmer, M.D. [facility] Pulmonary/Critical Care Medicine. Pager: [phone]. After hours pager: [phone].

## 2014-07-18 NOTE — Progress Notes (Signed)
Called emergently by eICU, patient in cardiac arrest, PEA.  Patient was resuscitated, see code note.  Family requested to speak with MD for updates.  Entire chart reviewed.  Additional kayexalate given.  ABG ordered and reviewed, settings on the oscillator adjusted and f/u ABG done.  Anticipate arrest was due to acidosis, now responding to bicarb nicely.  Spoke with wife, wishes to withdraw now.  There is a family meeting for tomorrow however.  Discussed further.  Agreed to DNR for now and will speak with Dr. Alva Garnet in the morning.  However, she wishes to speak with the rest of the family then will get back to [place] to finalize the DNR.  Remains full code for now.  Off concern.  BIS monitor was in the 30's pre code now is 3-4.  Likely anoxic injury.  Wife aware.  The patient is critically ill with multiple organ systems failure and requires high complexity decision making for assessment and support, frequent evaluation and titration of therapies, application of advanced monitoring technologies and extensive interpretation of multiple databases.   Critical Care Time devoted to patient care services described in this note is  45  Minutes. This time reflects time of care of this signee Dr Jennet Maduro. This critical care time does not reflect procedure time, or teaching time or supervisory time of PA/NP/Med student/Med Resident etc but could involve care discussion time.  Rush Farmer, M.D. [facility] Pulmonary/Critical Care Medicine. Pager: [phone]. After hours pager: [phone].

## 2014-07-18 NOTE — Progress Notes (Signed)
   07/18/14 2100  Clinical Encounter Type  Visited With Family;Health care provider  Visit Type Follow-up;Spiritual support;Social support;Critical Care  Spiritual Encounters  Spiritual Needs Emotional  Stress Factors  Family Stress Factors Health changes;Major life changes   Chaplain checked back in with patient's family this evening because it looked like they were having a difficult time. Chaplain found out the patient's daughter had arrived and was updated on the patient's condition. Patient's daughter seemed to be having a hard time but plenty of family and friends supported her. Patient's mother said they would contact chaplain if needed later tonight.  Gar Ponto, Chaplain  9:03 PM

## 2014-07-18 NOTE — Progress Notes (Signed)
Name: [name] MRN: [id number] DOB: 1955-09-20    ADMISSION DATE:  06/16/2014  CHIEF COMPLAINT:  Respiratory failure   BRIEF PATIENT DESCRIPTION:  59 yo male smoker has dyspnea, subjective fever since April 2016 with acute hypoxic respiratory failure and persistent pulmonary infiltrates.   SIGNIFICANT EVENTS/STUDIES:  4/28 to 5/08 Admit to [facility] with PNA 4/28 CT chest: R suprahilar LAN 2.3 cm, Rt hilar LAN 3.3 cm, Lt infrahilar LAN 2.6 cm, interlobular septal thickening and scattered GGO, patchy nodular infiltrates 5/19 Admit to [facility] 5/20 Transfer to [facility] 5/20 CT chest: progression of bilat patchy opacities with new BLL consolidation with air bronchograms.  No change R hilar enlargement 5/20 TTE: mild LVH, EF 60 to 65% 5/22 ACE: 32 5/23 RF: 8.8, ANCA negative 5/27 VATS lung and LN biopsy  LN biopsies all negative for malignancy  Lung biopsy P as of 6/02 5/28 ARDS severe, placed on NMB and HFOV 5/30 HFOV change to PCV 5/31 Worsening gas exchange. Prone positioning  5/31 Cytology on EBUS specs all negative for malignancy 6/01 Episodic desaturations. Repositioned to supine 6/02 Severe persistent hypoxemia and hypercarbia. Resume HFOV. Continued NMB.    SUBJECTIVE:  Remains sedated, paralyzed, intubated. Surg path report still pending  VITAL SIGNS: Temp:  [96.8 F (36 C)-98.8 F (37.1 C)] 98.8 F (37.1 C) (06/02 1100) Pulse Rate:  [73-119] 117 (06/02 1000) Resp:  [19-42] 19 (06/02 1130) BP: (99-147)/(60-92) 122/80 mmHg (06/02 1130) SpO2:  [84 %-99 %] 96 % (06/02 1000) Arterial Line BP: (85-282)/(49-243) 103/67 mmHg (06/02 1130) FiO2 (%):  [100 %] 100 % (06/02 0600) Weight:  [104.7 kg (230 lb 13.2 oz)] 104.7 kg (230 lb 13.2 oz) (06/02 0423)  VENT SETTINGS: Vent Mode:  [-] PCV FiO2 (%):  [100 %] 100 % Set Rate:  [38 bmp] 38 bmp PEEP:  [14 cmH20] 14 cmH20 Mean Airway Pressure:  [30.1 cmH2O] 30.1 cmH2O Plateau Pressure:  [36 cmH20] 36 cmH20    PHYSICAL  EXAMINATION: General: Sedated, paralyzed, intubated Neuro:  Paralyzed HEENT: WNL Cardiovascular: Reg, no M Lungs: no wheezes, B crackles Abdomen:  Soft, diminished BS Ext: no edema, warm   CMP Latest Ref Rng 07/18/2014 07/17/2014 07/16/2014  Glucose 65 - 99 mg/dL 178(H) 167(H) 249(H)  BUN 6 - 20 mg/dL 95(H) 74(H) 70(H)  Creatinine 0.61 - 1.24 mg/dL 1.39(H) 1.25(H) 1.41(H)  Sodium 135 - 145 mmol/L 142 142 136  Potassium 3.5 - 5.1 mmol/L 6.0(H) 5.8(H) 4.5  Chloride 101 - 111 mmol/L 97(L) 96(L) 92(L)  CO2 22 - 32 mmol/L 36(H) 38(H) 33(H)  Calcium 8.9 - 10.3 mg/dL 8.5(L) 8.2(L) 7.9(L)  Total Protein 6.5 - 8.1 g/dL 6.3(L) 5.6(L) 5.4(L)  Total Bilirubin 0.3 - 1.2 mg/dL 0.5 0.6 0.7  Alkaline Phos 38 - 126 U/L 101 76 62  AST 15 - 41 U/L '23 18 21  '$ ALT 17 - 63 U/L '22 22 21    '$ CBC Latest Ref Rng 07/18/2014 07/17/2014 07/16/2014  WBC 4.0 - 10.5 K/uL 14.1(H) 14.1(H) 9.8  Hemoglobin 13.0 - 17.0 g/dL 13.9 13.5 11.9(L)  Hematocrit 39.0 - 52.0 % 48.6 45.4 37.9(L)  Platelets 150 - 400 K/uL 120(L) 142(L) 127(L)    CBG (last 3)   Recent Labs  07/17/14 2341 07/18/14 0345 07/18/14 0753  GLUCAP 149* 154* 175*    CXR: ARDS pattern    ASSESSMENT / PLAN:  PULMONARY A: Acute hypoxic respiratory ARDS 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 Gas exchange seems out of proportion to CXR findings P: Resume HFOV 6/02  Cont vent bundle Daily SBT if/when meets criteria Cont steroids - dose adjusted 5/31 Cont nebulized steroids and BDs   CARDIAC A: Hx of HTN P: Monitor hemodynamics  RENAL A: AKI Hyperkalemia, recurrent Mild hyponatremia, resolved P: Monitor BMET intermittently Monitor I/Os Correct electrolytes as indicated Recheck BMET 6/02 PM  GASTRO A: High GVRs - tolerating trickle TFs P: SUP: IV PPI Cont TFs @ 20 cc/hr 6/02  HEME A: Mild thrombocytopenia P: DVT px: LMWH Monitor CBC intermittently Transfuse per usual ICU guidelines  INFECTION A:  No obvious infection,  minimally elevated PCT P: Cefepime 5/28 >> 6/01 Follow up micro to completion  ENDOCRINE A: Steroid induced hyperglycemia P: Cont SSI Lantus adjusted 6/01  NEURO A: On NMB and sedative drips  P: RASS goal: N/A while on NMB Cont BIS. Goal 40-60 Cont fent and midaz gtt Cont NMB 6/02 at least until off HFOV  I have requested to meet with wife 6/03 AM CCM X 45 mins  Merton Border, MD ; [facility] service Mobile [phone].  After 5:30 PM or weekends, call [phone]

## 2014-07-18 NOTE — Progress Notes (Signed)
Oscillator based VS.

## 2014-07-18 NOTE — Progress Notes (Addendum)
Nutrition Follow-up  DOCUMENTATION CODES:  Non-severe (moderate) malnutrition in context of acute illness/injury  INTERVENTION:  As medically appropriate, advance Vital AF 1.2 formula by 10 ml every 8 hours to goal rate of 70 ml/hr  TF regimen to provide 2016 kcals, 126 gm protein, 1362 ml of free water  NUTRITION DIAGNOSIS:  Inadequate oral intake related to inability to eat as evidenced by NPO status, ongoing  GOAL:  Patient will meet greater than or equal to 90% of their needs, currently unmet  MONITOR:  Vent status, TF tolerance, Labs, Weight trends, I & O's  ASSESSMENT: Per H&P, 59 yo male h/o HTN previously very healthy up until 2 months ago when he was diagnosed with walking PNA. Was treated as outpatient with antibiotics, worsened and presented to the ED over 2 weeks ago when he was hospitalized with acute hypoxid respiratory failure and bilateral PNA.Pt use to smoke 2-3 packs/day cigerettes for about 12 years, has quit due to this illness.  Patient s/p procedure 5/27: LEFT VIDEO ASSISTED THORACOSCOPY WEDGE RESECTION LUL  Patient is currently intubated on ventilator support MV: 12.7 L/min Temp (24hrs), Avg:97.8 F (36.6 C), Min:96.8 F (36 C), Max:98.8 F (37.1 C)    Pt vomited 5/31 when feeding residual back.  TF stopped at that time.  Vital AF 1.2 formula resumed 6/1 and currently infusing at 20 ml/hr via OGT.    IV Lasix ordered 2 times daily x 4 doses 6/2.  Height:  Ht Readings from Last 1 Encounters:  07/03/14 6' (1.829 m)    Weight:  Wt Readings from Last 1 Encounters:  07/18/14 230 lb 13.2 oz (104.7 kg)    Ideal Body Weight:  83.6 kg (kg)  Wt Readings from Last 10 Encounters:  07/18/14 230 lb 13.2 oz (104.7 kg)  07/03/14 233 lb (105.688 kg)  06/15/14 240 lb 4.8 oz (109 kg)  04/19/14 245 lb (111.131 kg)  04/04/14 240 lb (108.863 kg)  03/26/14 245 lb (111.131 kg)  03/13/14 245 lb (111.131 kg)  03/11/14 245 lb (111.131 kg)  07/23/13  251 lb (113.853 kg)  07/05/13 240 lb (108.863 kg)    BMI:  Body mass index is 30.46 kg/(m^2). falsely high   Re-estimated Nutritional Needs:  Kcal:  2173  Protein:  120-130 gm  Fluid:  per MD  Skin:  Reviewed, no issues  Diet Order:  NPO  EDUCATION NEEDS:  No education needs identified at this time   Intake/Output Summary (Last 24 hours) at 07/18/14 1400 Last data filed at 07/18/14 1300  Gross per 24 hour  Intake 2043.6 ml  Output   1420 ml  Net  623.6 ml    Last BM:  5/26  Montray Holms, RD, LDN Pager #: [phone] After-Hours Pager #: [phone]

## 2014-07-18 NOTE — Care Management Note (Addendum)
Case Management Note  Patient Details  Name: [name] MRN: [id number] Date of Birth: 1955/10/31  Subjective/Objective:    Continues on Vent in Proned Position. IV Nimbex and Fentanyl.                Action/Plan: Will continue to follow.   Expected Discharge Date:                  Expected Discharge Plan:  Home/Self Care  In-House Referral:  NA  Discharge planning Services  CM Consult  Post Acute Care Choice:  NA Choice offered to:  NA  DME Arranged:    DME Agency:     HH Arranged:    HH Agency:     Status of Service:  In process, will continue to follow  Medicare Important Message Given:    Date Medicare IM Given:    Medicare IM give by:    Date Additional Medicare IM Given:    Additional Medicare Important Message give by:     If discussed at [place] of Stay Meetings, dates discussed:  07/18/14 Additional Comments:  Delrae Sawyers, RN 07/18/2014, 8:55 AM

## 2014-07-18 NOTE — Progress Notes (Signed)
Pt's  cuff deflated 4cc to help eliminate CO2 per HFOV protocol.  RT will continue to monitor.  MD and RN notified.

## 2014-07-19 ENCOUNTER — Other Ambulatory Visit (HOSPITAL_COMMUNITY): Payer: 59

## 2014-07-19 ENCOUNTER — Inpatient Hospital Stay (HOSPITAL_COMMUNITY): Payer: 59

## 2014-07-19 ENCOUNTER — Encounter (HOSPITAL_COMMUNITY): Payer: 59 | Admitting: Occupational Therapy

## 2014-07-19 ENCOUNTER — Encounter (HOSPITAL_COMMUNITY): Payer: Self-pay

## 2014-07-19 DIAGNOSIS — N179 Acute kidney failure, unspecified: Secondary | ICD-10-CM

## 2014-07-19 DIAGNOSIS — I469 Cardiac arrest, cause unspecified: Secondary | ICD-10-CM

## 2014-07-19 DIAGNOSIS — J8409 Other alveolar and parieto-alveolar conditions: Secondary | ICD-10-CM

## 2014-07-19 LAB — BLOOD GAS, ARTERIAL
ACID-BASE EXCESS: 16 mmol/L — AB (ref 0.0–2.0)
AMPLITUDE: 101
Acid-Base Excess: 14.4 mmol/L — ABNORMAL HIGH (ref 0.0–2.0)
Acid-Base Excess: 15.2 mmol/L — ABNORMAL HIGH (ref 0.0–2.0)
Acid-Base Excess: 15.5 mmol/L — ABNORMAL HIGH (ref 0.0–2.0)
Amplitude: 104
Amplitude: 105
Amplitude: 105
BICARBONATE: 43.8 meq/L — AB (ref 20.0–24.0)
Bicarbonate: 42.6 mEq/L — ABNORMAL HIGH (ref 20.0–24.0)
Bicarbonate: 44.1 mEq/L — ABNORMAL HIGH (ref 20.0–24.0)
Bicarbonate: 42.9 mEq/L — ABNORMAL HIGH (ref 20.0–24.0)
FIO2: 1 %
FIO2: 1 %
FIO2: 1 %
FIO2: 100 %
HERTZ: 5
HERTZ: 6
Hertz: 6
Hertz: 4
MAP: 40 cmH2O
MAP: 42 cmH2O
MAP: 36 cmH2O
Map: 38 cmH20
O2 SAT: 74.3 %
O2 Saturation: 73.3 %
O2 Saturation: 76.3 %
O2 Saturation: 75 %
PCO2 ART: 102 mmHg — AB (ref 35.0–45.0)
PH ART: 7.245 — AB (ref 7.350–7.450)
Patient temperature: 98.6
Patient temperature: 98.6
Patient temperature: 98.6
Patient temperature: 98.6
TCO2: 46 mmol/L (ref 0–100)
TCO2: 47.6 mmol/L (ref 0–100)
TCO2: 46.1 mmol/L (ref 0–100)
TCO2: 47.4 mmol/L (ref 0–100)
pH, Arterial: 7.205 — ABNORMAL LOW (ref 7.350–7.450)
pH, Arterial: 7.22 — ABNORMAL LOW (ref 7.350–7.450)
pH, Arterial: 7.202 — ABNORMAL LOW (ref 7.350–7.450)
pO2, Arterial: 46.5 mmHg — ABNORMAL LOW (ref 80.0–100.0)
pO2, Arterial: 45.4 mmHg — ABNORMAL LOW (ref 80.0–100.0)
pO2, Arterial: 48.2 mmHg — ABNORMAL LOW (ref 80.0–100.0)
pO2, Arterial: 47.1 mmHg — ABNORMAL LOW (ref 80.0–100.0)

## 2014-07-19 LAB — CBC
HEMATOCRIT: 46.6 % (ref 39.0–52.0)
Hemoglobin: 13.3 g/dL (ref 13.0–17.0)
MCH: 28.5 pg (ref 26.0–34.0)
MCHC: 28.5 g/dL — ABNORMAL LOW (ref 30.0–36.0)
MCV: 100 fL (ref 78.0–100.0)
Platelets: 96 10*3/uL — ABNORMAL LOW (ref 150–400)
RBC: 4.66 MIL/uL (ref 4.22–5.81)
RDW: 14.1 % (ref 11.5–15.5)
WBC: 19.2 10*3/uL — AB (ref 4.0–10.5)

## 2014-07-19 LAB — GLUCOSE, CAPILLARY
Glucose-Capillary: 141 mg/dL — ABNORMAL HIGH (ref 65–99)
Glucose-Capillary: 125 mg/dL — ABNORMAL HIGH (ref 65–99)
Glucose-Capillary: 82 mg/dL (ref 65–99)

## 2014-07-19 LAB — BASIC METABOLIC PANEL
Anion gap: 10 (ref 5–15)
BUN: 130 mg/dL — AB (ref 6–20)
CO2: 40 mmol/L — AB (ref 22–32)
Calcium: 8.3 mg/dL — ABNORMAL LOW (ref 8.9–10.3)
Chloride: 97 mmol/L — ABNORMAL LOW (ref 101–111)
Creatinine, Ser: 2.19 mg/dL — ABNORMAL HIGH (ref 0.61–1.24)
GFR calc Af Amer: 36 mL/min — ABNORMAL LOW (ref >60)
GFR calc non Af Amer: 31 mL/min — ABNORMAL LOW (ref >60)
Glucose, Bld: 155 mg/dL — ABNORMAL HIGH (ref 65–99)
Potassium: 5.6 mmol/L — ABNORMAL HIGH (ref 3.5–5.1)
Sodium: 147 mmol/L — ABNORMAL HIGH (ref 135–145)

## 2014-07-19 MED ORDER — MIDAZOLAM HCL 2 MG/2ML IJ SOLN: 2.0000 mg | INTRAMUSCULAR | Status: DC | PRN

## 2014-07-19 MED ORDER — FENTANYL BOLUS VIA INFUSION
50.0000 ug | INTRAVENOUS | Status: DC | PRN
  Filled 2014-07-19: qty 50

## 2014-07-19 MED ORDER — SODIUM CHLORIDE 0.9 % IV SOLN
1.0000 mg/h | INTRAVENOUS | Status: DC
  Filled 2014-07-19: qty 10

## 2014-07-19 MED FILL — Medication: Qty: 1 | Status: AC

## 2014-07-22 ENCOUNTER — Encounter (HOSPITAL_COMMUNITY): Payer: 59

## 2014-07-23 ENCOUNTER — Other Ambulatory Visit (HOSPITAL_COMMUNITY): Payer: 59

## 2014-07-23 ENCOUNTER — Telehealth: Payer: Self-pay

## 2014-07-23 NOTE — Telephone Encounter (Signed)
On 07/23/2014 I received a death certificate from [employer]. The certificate is for burial. This patient is a patient of Doctor Simonds. Per Doctor Alva Garnet the death certificate will be taken to [facility]) tomorrow morning because he is really busy today. On 2014/08/09 I received the death certificate back completed from Doctor Simonds. I got the death certificate ready and called the funeral home to let them know the death certificate was ready for pickup.

## 2014-07-24 ENCOUNTER — Encounter (HOSPITAL_COMMUNITY): Payer: 59

## 2014-07-26 ENCOUNTER — Encounter (HOSPITAL_COMMUNITY): Payer: 59 | Admitting: Occupational Therapy

## 2014-07-29 ENCOUNTER — Encounter (HOSPITAL_COMMUNITY): Payer: 59 | Admitting: Occupational Therapy

## 2014-07-31 ENCOUNTER — Encounter (HOSPITAL_COMMUNITY): Payer: 59

## 2014-08-02 ENCOUNTER — Encounter (HOSPITAL_COMMUNITY): Payer: 59 | Admitting: Occupational Therapy

## 2014-08-05 ENCOUNTER — Encounter (HOSPITAL_COMMUNITY): Payer: 59

## 2014-08-07 ENCOUNTER — Encounter (HOSPITAL_COMMUNITY): Payer: 59

## 2014-08-09 ENCOUNTER — Encounter (HOSPITAL_COMMUNITY): Payer: 59 | Admitting: Occupational Therapy

## 2014-08-09 LAB — FUNGUS CULTURE W SMEAR
Fungal Smear: NONE SEEN
Fungal Smear: NONE SEEN

## 2014-08-16 NOTE — Progress Notes (Signed)
Chaplain respond to a page. Pt was dying and family was in the room. Daughter was emotional. Chaplain asked for reflections from family. Chaplain prayed for family and read scripture for comfort. Other family came and Chaplain attempted to facilitate help and comfort with main focus on the wife and two daughters. Chaplain left family before final viewing.   August 09, 2014 1600  Clinical Encounter Type  Visited With Patient and family together  Visit Type Spiritual support;Patient actively dying  Referral From Nurse  Spiritual Encounters  Spiritual Needs Prayer;Emotional;Grief support  Stress Factors  Family Stress Factors Loss

## 2014-08-16 NOTE — Progress Notes (Signed)
220 ml fentanyl infusion wasted in sink and 10 ml versed infusion wasted in the sink.  Witnessed by Sula Soda.  Richardean Canal RN, BSN, CCRN

## 2014-08-16 NOTE — Progress Notes (Signed)
Patient asystole on monitor at [place], wife, and daughters at bedside.  No breath sounds or heart sounds on auscultation and pupils non-reactive to light. Findings verified by second RN [employer].  CDS notified, post mortem doc flow sheet complete.  Verified with ME Wynona Canes that this is not a ME case.  Patient bathed and shaved at request of family.  Will continue to support family and assess needs.  Richardean Canal RN, BSN, CCRN

## 2014-08-16 NOTE — Discharge Summary (Signed)
DEATH SUMMARY  DATE OF ADMISSION:  17-Jul-2014  DATE OF DISCHARGE/DEATH:  2014/07/31  ADMISSION DIAGNOSES:   Acute hypoxic respiratory failure PNA, NOS Lung mass Hypertension   DISCHARGE DIAGNOSES:      PRESENTATION:   Pt was admitted to the [facility] service with the following HPI and the above admission diagnoses:  Chief Complaint:  sob  HPI: 59 yo male h/o HTN previously very healthy up until 2 months ago when he was diagnosed with walking pna. Was treated as outpatient with antibiotics, worsened and presented to the ED over 2 weeks ago when he was hospitalized with acute hypoxid respiratory failure and bilateral pna. He was treated with broad spectrum abx, and sent home to complete another 7 days of levaquin which he completed. He had a ct of his chest at that time which showed multilobular bronchopna, with questionable area of thickening concerning for underlying cancer and was neg for pulmonary emboli. Pt was discharged on 5 liters of oxygen supplementation. He has f/u with dr Luan Pulling this past week, but no outpatient bronchoscopy has been arranged yet due to his continued hypoxia. Pt use to smoke 2-3 ppd cigerettes for about 12 years, has quit due to this illness. He has no known heart failure. His oxygen sats have continued to go down with exertion at home, sometimes down to 80% on his 5 liters. He comes in today for worsening sob at rest. He denies any swelling. He has been having a frothy cough. No fevers. He never has chest pain. No weight gain. He had a 2 D echo last time he was here which was normal. nml bnp at that time also. He denies any orthopnea or PND. He had a sleep study last night result unknown. Pt also has not had PET scan arranged. He was also treated during his last hospitalization with steroids, and lasix (this was weaned off due to worsening renal failure).   HOSPITAL COURSE:   SIGNIFICANT EVENTS/STUDIES:  4/28 to 5/08 Admit to [facility] with PNA 4/28 CT  chest: R suprahilar LAN 2.3 cm, Rt hilar LAN 3.3 cm, Lt infrahilar LAN 2.6 cm, interlobular septal thickening and scattered GGO, patchy nodular infiltrates 5/19 Admit to [facility] 17-Jul-2022 Transfer to [facility] 07-17-2022 CT chest: progression of bilat patchy opacities with new BLL consolidation with air bronchograms. No change R hilar enlargement 07-17-2022 TTE: mild LVH, EF 60 to 65% 5/22 ACE: 32 5/23 RF: 8.8, ANCA negative 5/27 VATS lung and LN biopsy LN biopsies all negative for malignancy Lung biopsy: diffuse alveolar damage 5/28 ARDS severe, placed on NMB and HFOV 5/30 HFOV change to PCV 5/31 Worsening gas exchange. Prone positioning  5/31 Cytology on EBUS specs all negative for malignancy 6/01 Episodic desaturations. Repositioned to supine 6/02 Severe persistent hypoxemia and hypercarbia. Resume HFOV. Continued NMB.  6/02 Cardiac arrest with asystole. 30 mins ACLS. Vasopressors initiated. HCO3 gtt initiated July 31, 2022 Surg path report: DAD 2022/07/31 The following was documented: Detailed conversation with pt's wife, 2 daughters and other loved ones. I described the severity of his lung disease and the lack of therapeutic options in the context of the pathology findings. We discussed the events of 6/02 - i.e cardiac arrest and its potential consequences. I explained his development of shock and renal failure. In short, I expressed my belief that this is not an illness that he would be able to survive. His wife relates that he has expressed his wishes to her in such a circumstance and that he would not wish to be  sustained on prolonged life support if there is no reasonable hope of a functional recovery. I offered that we could withdraw vent and life sustaining therapies now as a medically reasonable option or that we could continue our current support with no escalation and no ACLS recognizing that he would likely pass away on the vent within the next couple or few days. They have opted for withdrawal  now and full comfort care. The "Withdrawal of Life-Sustaining Therapies" protocol has been ordered  He passed away comfortably shortly after discontinuation of vent support  Cause of death:  ARDS, diffuse alveolar damage  Contributing factors: Cardiac arrest AKI Post anoxic encephalopathy  Autopsy: No  Smoking: yes   Merton Border, MD;  PCCM service; [place] [phone]

## 2014-08-16 NOTE — Procedures (Signed)
Extubation Procedure Note  Patient Details:   Name: CASSELL VOORHIES DOB: [birth year] MRN: [id number]   Airway Documentation:     Evaluation   Pt terminally extubated per MD order. RT will continue to monitor.    Mariam Dollar 2014-07-28, 2:53 PM

## 2014-08-16 NOTE — Progress Notes (Signed)
[facility] Progress Note Patient Name: [name] DOB: [birth year] MRN: [id number]   Date of Service  08-10-14  HPI/Events of Note  ABG on 100%/Paw 40/f = 5 Hz = 7.22/>100/48.   eICU Interventions  Will increase Paw to 42 and decrease f to 4 Hz. Re-check ABG in 1 hour.     Intervention Category Major Interventions: Respiratory failure - evaluation and management  [name] 2014-08-10, 4:56 AM

## 2014-08-16 NOTE — Progress Notes (Addendum)
Name: [name] MRN: [id number] DOB: 01-04-1956    ADMISSION DATE:  07/14/2014  CHIEF COMPLAINT:  Respiratory failure   BRIEF PATIENT DESCRIPTION:  59 yo male smoker has dyspnea, subjective fever since April 2016 with acute hypoxic respiratory failure and persistent pulmonary infiltrates.   SIGNIFICANT EVENTS/STUDIES:  4/28 to 5/08 Admit to [facility] with PNA 4/28 CT chest: R suprahilar LAN 2.3 cm, Rt hilar LAN 3.3 cm, Lt infrahilar LAN 2.6 cm, interlobular septal thickening and scattered GGO, patchy nodular infiltrates 5/19 Admit to [facility] 5/20 Transfer to [facility] 5/20 CT chest: progression of bilat patchy opacities with new BLL consolidation with air bronchograms.  No change R hilar enlargement 5/20 TTE: mild LVH, EF 60 to 65% 5/22 ACE: 32 5/23 RF: 8.8, ANCA negative 5/27 VATS lung and LN biopsy  LN biopsies all negative for malignancy  Lung biopsy: diffuse alveolar damage 5/28 ARDS severe, placed on NMB and HFOV 5/30 HFOV change to PCV 5/31 Worsening gas exchange. Prone positioning  5/31 Cytology on EBUS specs all negative for malignancy 6/01 Episodic desaturations. Repositioned to supine 6/02 Severe persistent hypoxemia and hypercarbia. Resume HFOV. Continued NMB.  6/02 Cardiac arrest with asystole. 30 mins ACLS. Vasopressors initiated. HCO3 gtt initiated 6/03 Surg path report: DAD  SUBJECTIVE:  Remains sedated, paralyzed, intubated. Surg path report still pending  VITAL SIGNS: Temp:  [98.3 F (36.8 C)-98.8 F (37.1 C)] 98.3 F (36.8 C) (06/03 0721) Pulse Rate:  [106-125] 106 (06/03 0630) Resp:  [0-116] 78 (06/03 0645) BP: (93-147)/(58-92) 111/76 mmHg (06/03 0600) SpO2:  [85 %-100 %] 88 % (06/03 0630) Arterial Line BP: (81-148)/(50-81) 92/65 mmHg (06/03 0645) FiO2 (%):  [100 %] 100 % (06/03 0500) Weight:  [107.5 kg (236 lb 15.9 oz)] 107.5 kg (236 lb 15.9 oz) (06/03 0500)  VENT SETTINGS: Vent Mode:  [-]  FiO2 (%):  [100 %] 100 % Mean Airway Pressure:  [30.1  cmH2O-42.1 cmH2O] 42.1 cmH2O    PHYSICAL EXAMINATION: General: Sedated, paralyzed, intubated Neuro:  Paralyzed HEENT: WNL Cardiovascular: Reg, no M Lungs: no wheezes, B crackles Abdomen:  Soft, diminished BS Ext: no edema, warm   CMP Latest Ref Rng 2014-08-07 07/18/2014 07/18/2014  Glucose 65 - 99 mg/dL 155(H) 209(H) 178(H)  BUN 6 - 20 mg/dL 130(H) 118(H) 95(H)  Creatinine 0.61 - 1.24 mg/dL 2.19(H) 1.65(H) 1.39(H)  Sodium 135 - 145 mmol/L 147(H) 144 142  Potassium 3.5 - 5.1 mmol/L 5.6(H) 6.0(H) 6.0(H)  Chloride 101 - 111 mmol/L 97(L) 102 97(L)  CO2 22 - 32 mmol/L 40(H) 37(H) 36(H)  Calcium 8.9 - 10.3 mg/dL 8.3(L) 8.5(L) 8.5(L)  Total Protein 6.5 - 8.1 g/dL - - 6.3(L)  Total Bilirubin 0.3 - 1.2 mg/dL - - 0.5  Alkaline Phos 38 - 126 U/L - - 101  AST 15 - 41 U/L - - 23  ALT 17 - 63 U/L - - 22    CBC Latest Ref Rng 2014-08-07 07/18/2014 07/17/2014  WBC 4.0 - 10.5 K/uL 19.2(H) 14.1(H) 14.1(H)  Hemoglobin 13.0 - 17.0 g/dL 13.3 13.9 13.5  Hematocrit 39.0 - 52.0 % 46.6 48.6 45.4  Platelets 150 - 400 K/uL 96(L) 120(L) 142(L)    CBG (last 3)   Recent Labs  07/18/14 1932 07/18/14 2309 2014/08/07 0341  GLUCAP 263* 217* 141*    CXR: worsening ARDS pattern    ASSESSMENT / PLAN:  PULMONARY A: Acute hypoxic respiratory ARDS Diffuse alveolar damage S/p VATS 07/02/2014 Gas exchange seems out of proportion to CXR findings P: Change back to  PRVC mode Cont vent bundle Daily SBT if/when meets criteria Cont steroids Cont nebulized steroids and BDs   CARDIAC A: Hx of HTN Cardiac arrest 6/02 Cardiogenic shock P: Monitor hemodynamics  RENAL A: AKI Hyperkalemia, recurrent Mild hyponatremia, resolved P: Monitor BMET intermittently Monitor I/Os Correct electrolytes as indicated Recheck BMET 6/02 PM  GASTRO A: High GVRs - tolerating trickle TFs P: SUP: IV PPI Cont TFs @ 20 cc/hr 6/02  HEME A: Thrombocytopenia P: DVT px: SCDs Monitor CBC intermittently Transfuse per  usual ICU guidelines  INFECTION A:  No obvious infection, minimally elevated PCT P: Cefepime 5/28 >> 6/01 Follow up micro to completion  ENDOCRINE A: Steroid induced hyperglycemia P: Cont SSI Cont Lantus  NEURO A: On NMB and sedative drips  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 Will discuss option of discontinuation of vent support  CCM X 45 mins  Merton Border, MD ; [facility] [phone].  After 5:30 PM or weekends, call [phone]   ADD: detailed conversation with pt's wife, 2 daughters and other loved ones. I described the severity of his lung disease and the lack of therapeutic options in the context of the pathology findings. We discussed the events of 6/02 - i.e cardiac arrest and its potential consequences.  I explained his development of shock and renal failure. In short, I expressed my belief that this is not an illness that he would be able to survive. His wife relates that he has expressed his wishes to her in such a circumstance and that he would not wish to be sustained on prolonged life support if there is no reasonable hope of a functional recovery. I offered that we could withdraw vent and life sustaining therapies now as a medically reasonable option or that we could continue our current support with no escalation and no ACLS recognizing that he would likely pass away on the vent within the next couple or few days. They have opted for withdrawal now and full comfort care. The "Withdrawal of Life-Sustaining Therapies" protocol has been ordered  Merton Border, MD ; [facility] [phone].  After 5:30 PM or weekends, call [phone]

## 2014-08-16 NOTE — Progress Notes (Signed)
HFOV discontinued per MD order. Pt placed back on conventional ventilator. RT will continue to monitor.

## 2014-08-16 DEATH — deceased

## 2014-08-20 ENCOUNTER — Encounter (HOSPITAL_COMMUNITY): Payer: Self-pay

## 2014-08-25 LAB — AFB CULTURE WITH SMEAR (NOT AT ARMC)
Acid Fast Smear: NONE SEEN
Acid Fast Smear: NONE SEEN

## 2015-09-02 IMAGING — CR DG CHEST 2V
2 series · 2 of 2 positions shown · non-contrast
Comparison: 07/09/2014

CLINICAL DATA: Pneumonia, shortness of Breath

EXAM:
CHEST  2 VIEW

[chest pa]
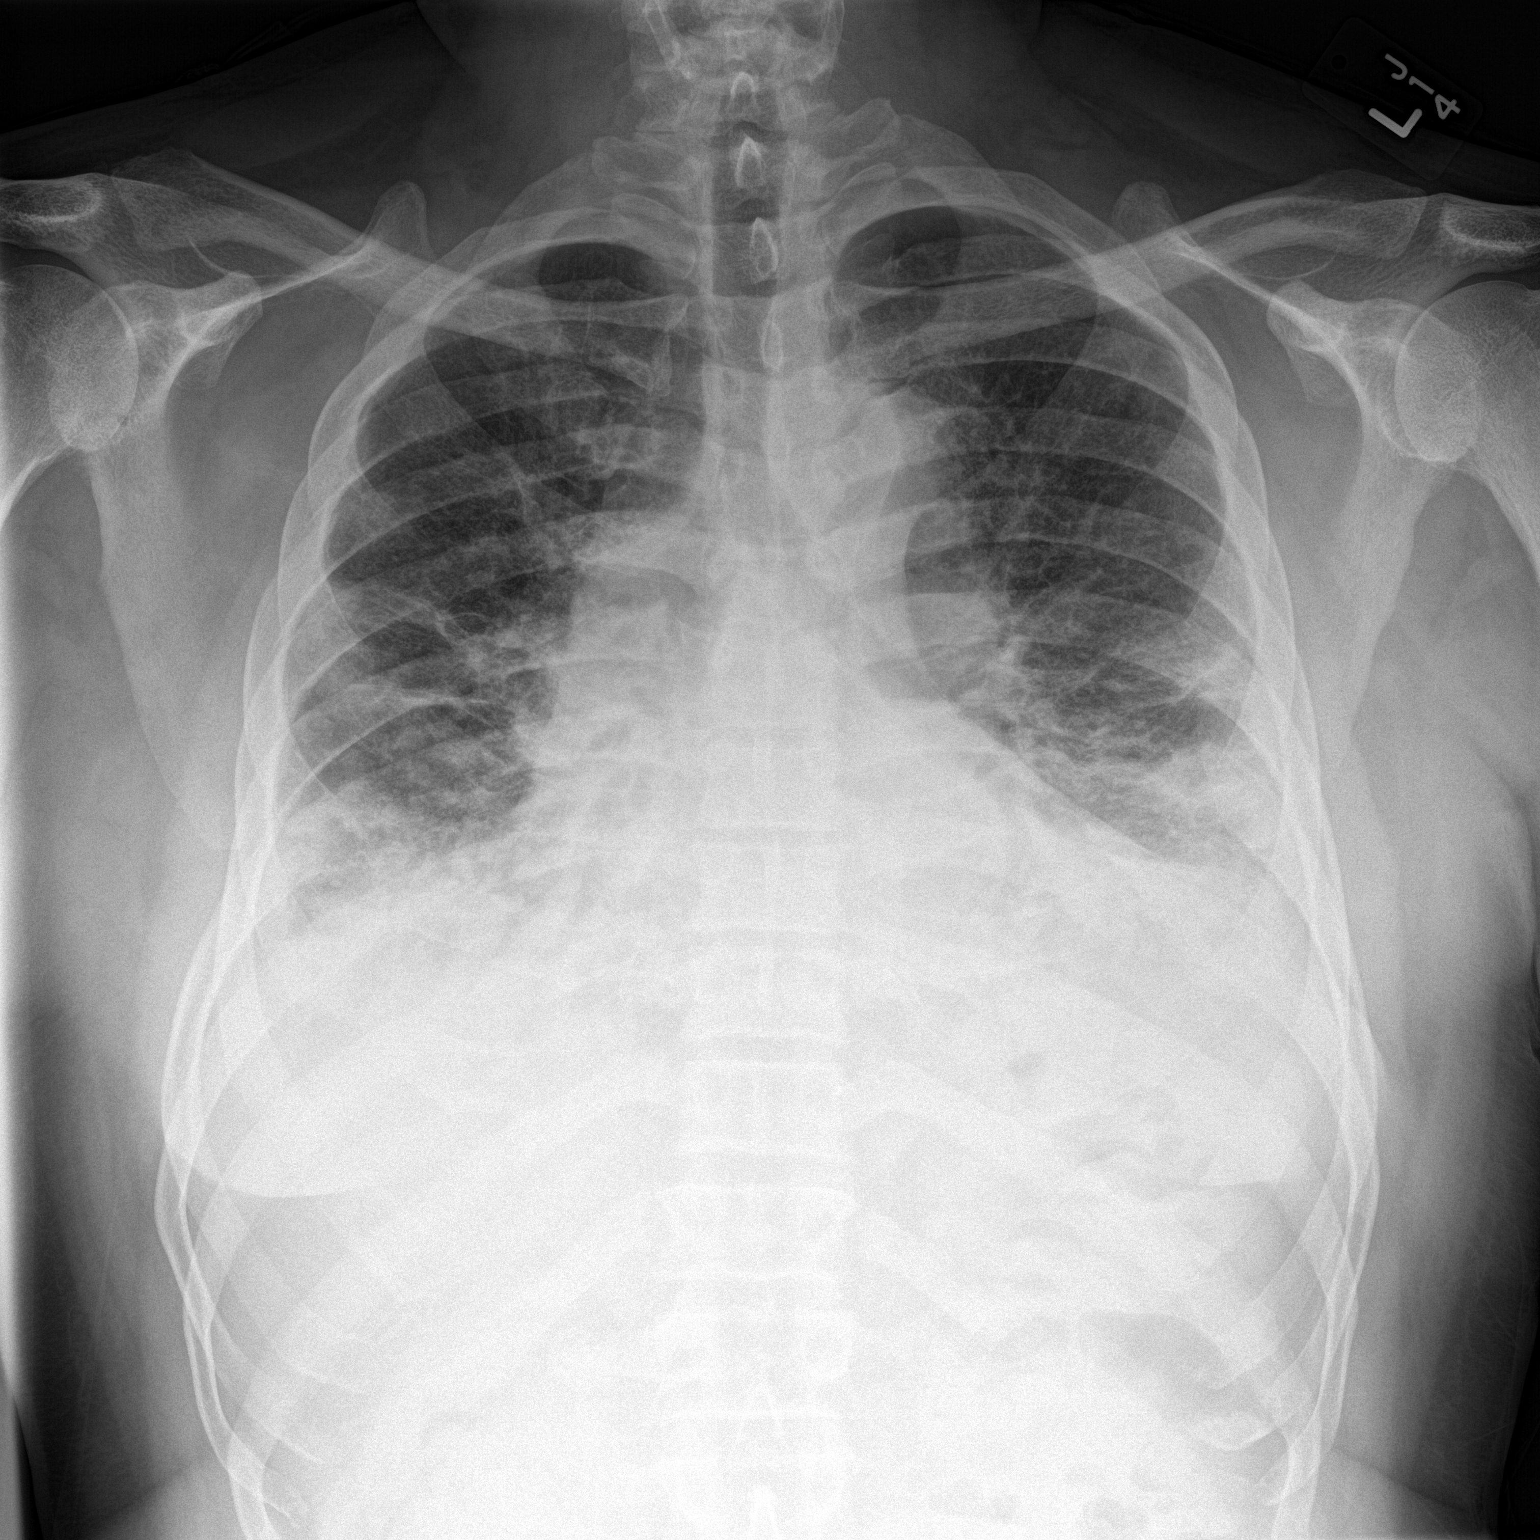

[chest lat]
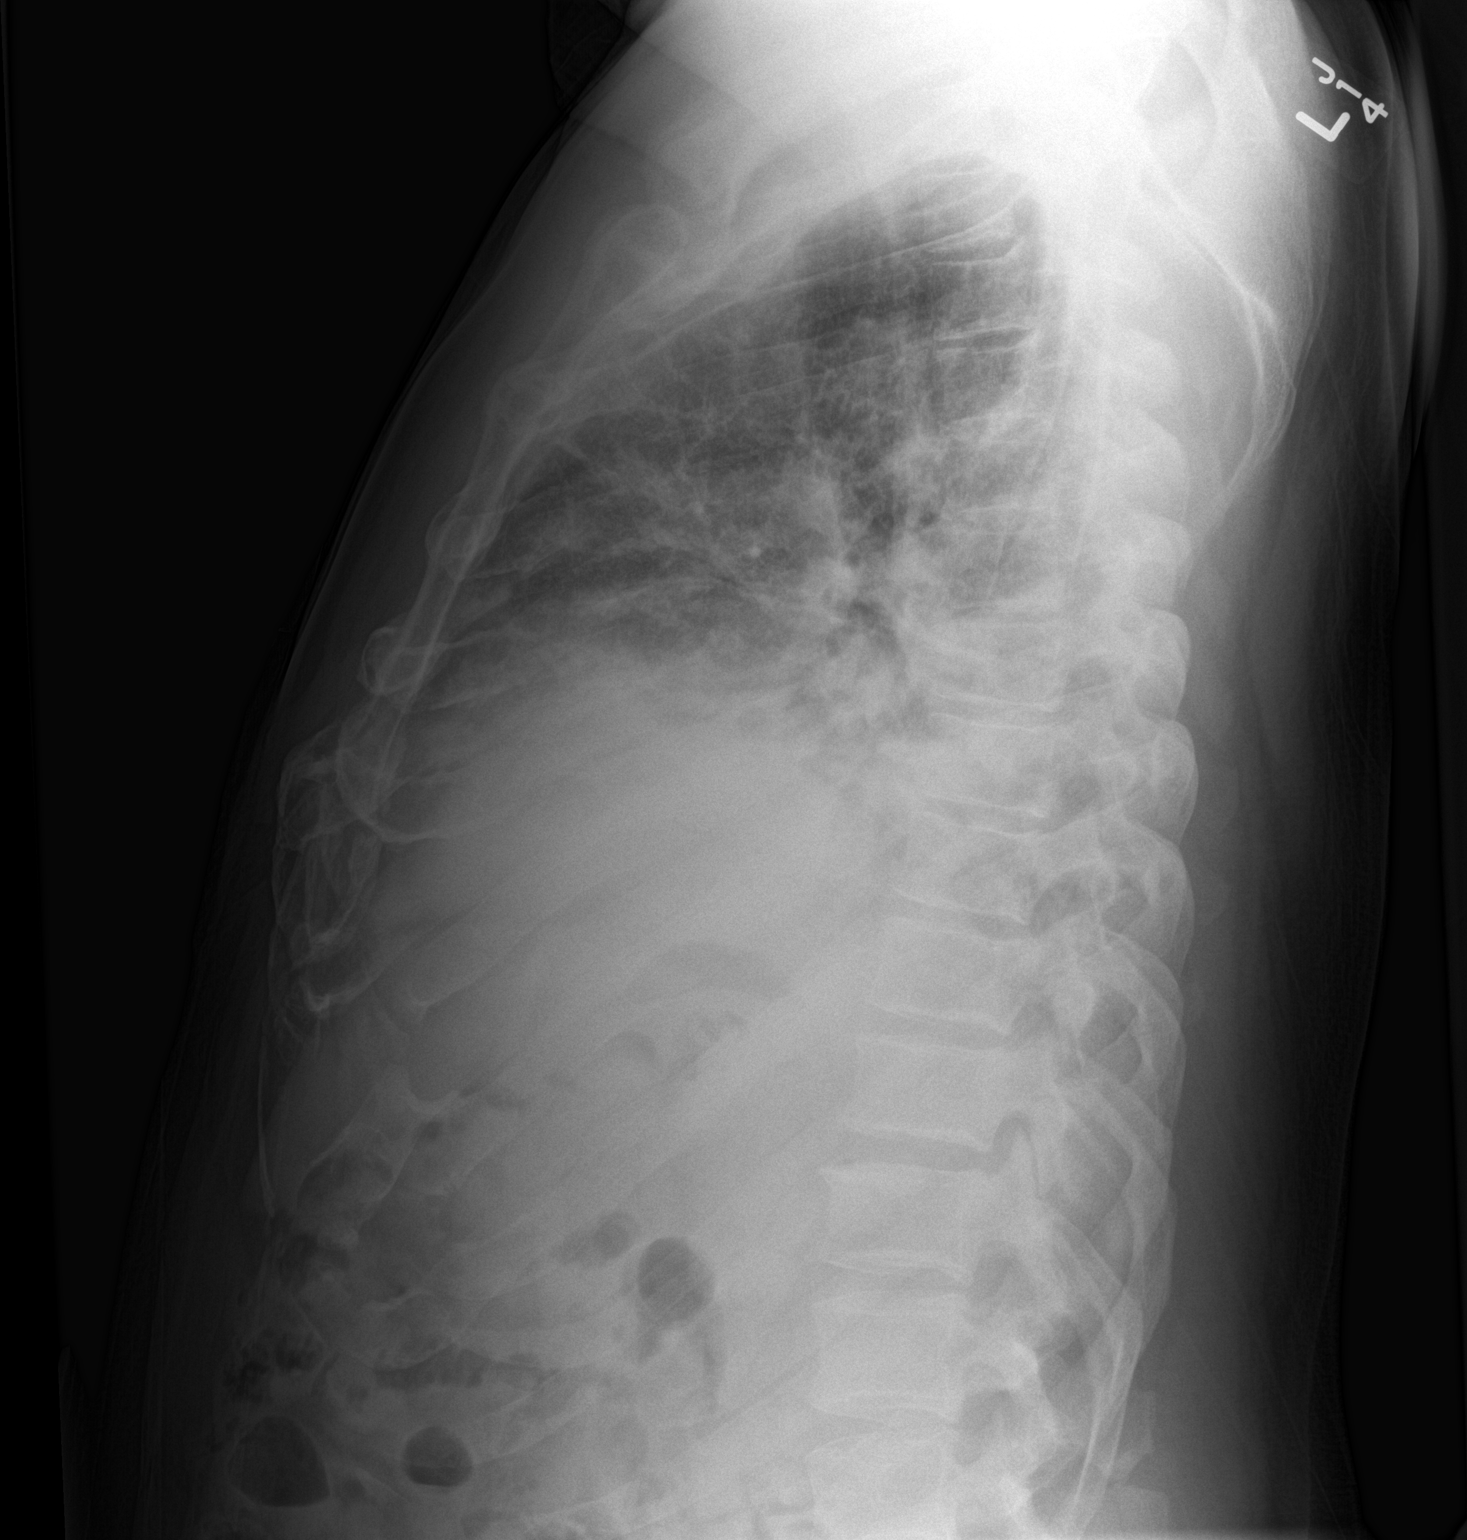

[2 of 2 positions shown; findings below may reference images not displayed]

FINDINGS: Cardiomediastinal silhouette is stable. Persistent central mild
vascular congestion and perihilar interstitial edema. Persistent
bilateral basilar atelectasis or infiltrate.
IMPRESSION: No significant change. Stable central mild vascular congestion and
mild perihilar interstitial edema. Again noted bilateral basilar
atelectasis or infiltrate.

## 2015-09-03 IMAGING — CR DG CHEST 1V PORT
1 series · 1 of 1 positions shown · non-contrast
Comparison: 1 day prior are

CLINICAL DATA: Left-sided fluoroscopy wedge resection.  Postop.

EXAM:
PORTABLE CHEST - 1 VIEW

[AP]
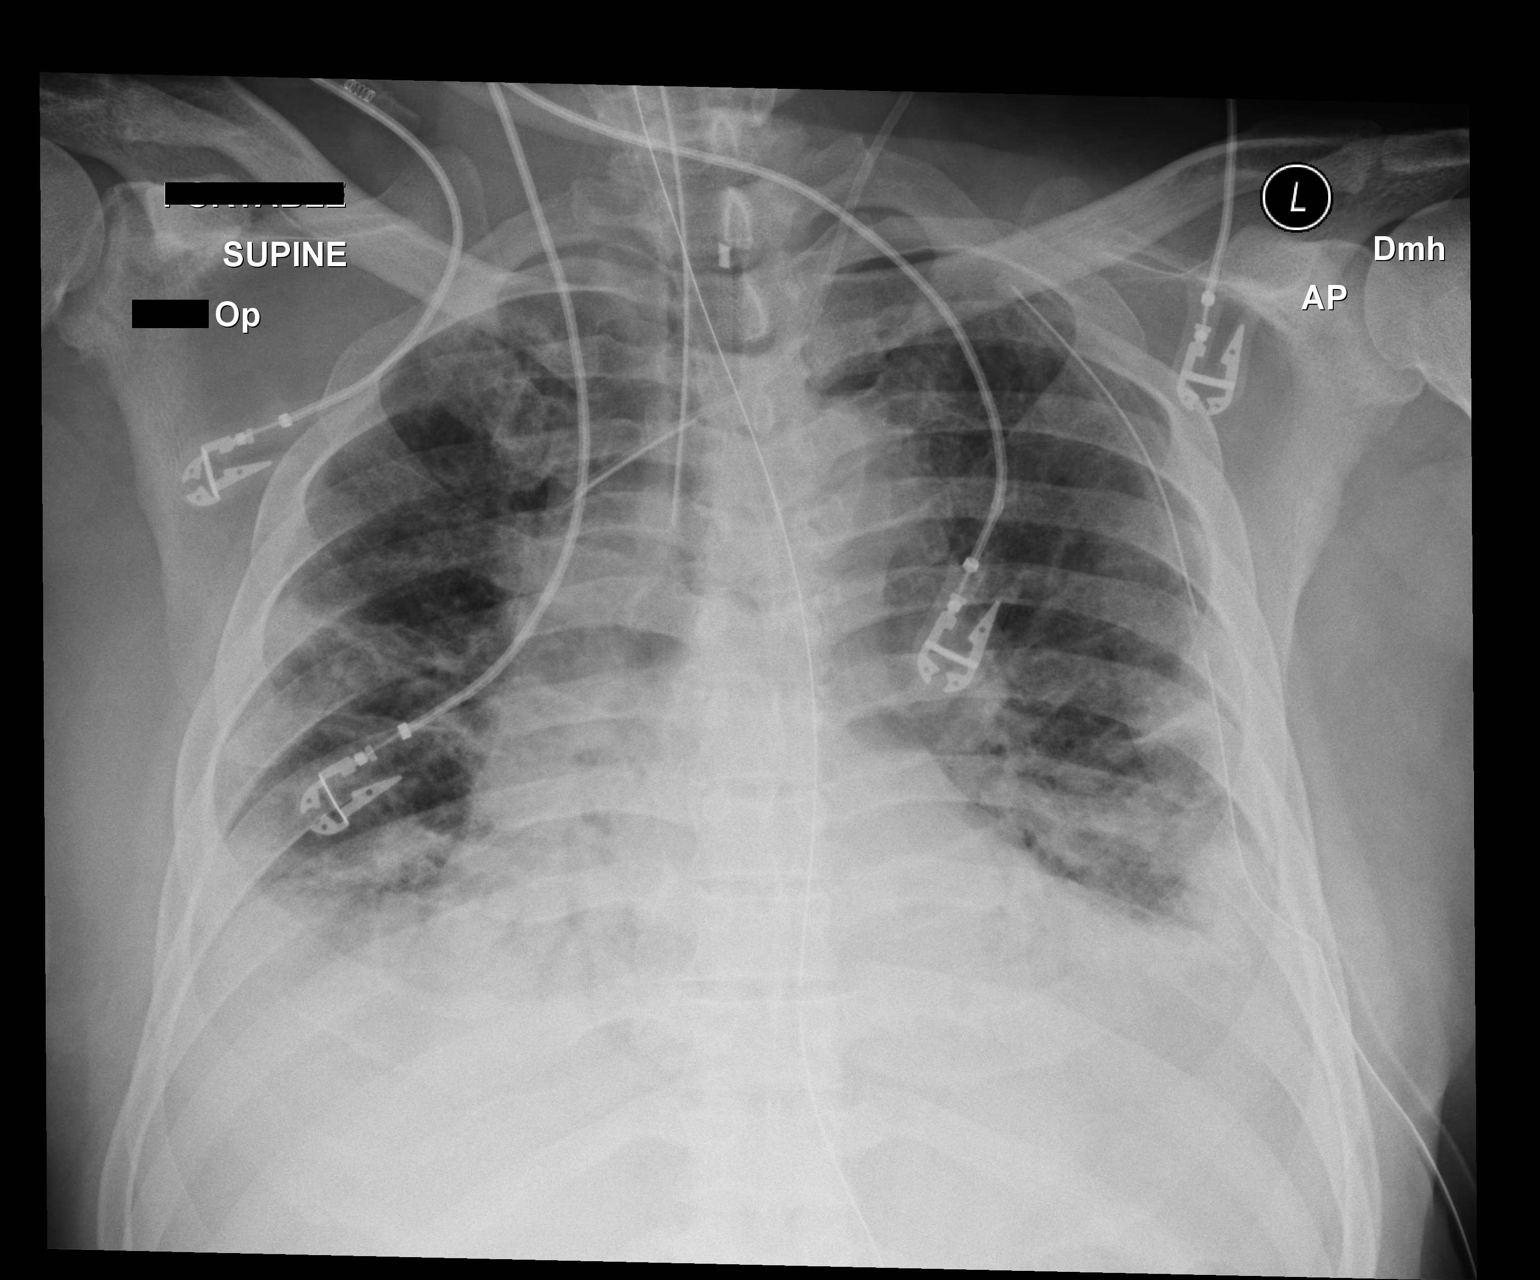

[1 of 1 positions shown; findings below may reference images not displayed]

FINDINGS: Endotracheal tube terminates 2.7 cm above carina. Nasogastric tube
extends beyond the inferior aspect of the thumb. Left internal
jugular line terminates at the high SVC.

A left chest tube is new. Cardiomegaly accentuated by AP portable
technique. Small bilateral pleural effusions are similar. No
pneumothorax. Patchy bibasilar airspace disease is not significantly
changed.
IMPRESSION: Appropriate position of support apparatus, including endotracheal
tube 2.7 cm above carina.

No change in bibasilar airspace disease with probable small
bilateral pleural effusions.

No pneumothorax.

Report called to operating [HOSPITAL]:37 a.m..

## 2015-09-04 IMAGING — CR DG CHEST 1V PORT
1 series · 1 of 1 positions shown · non-contrast
Comparison: 07/05/2014 at 8811 hours

CLINICAL DATA: Acute respiratory failure with hypoxia

EXAM:
PORTABLE CHEST - 1 VIEW

[AP]
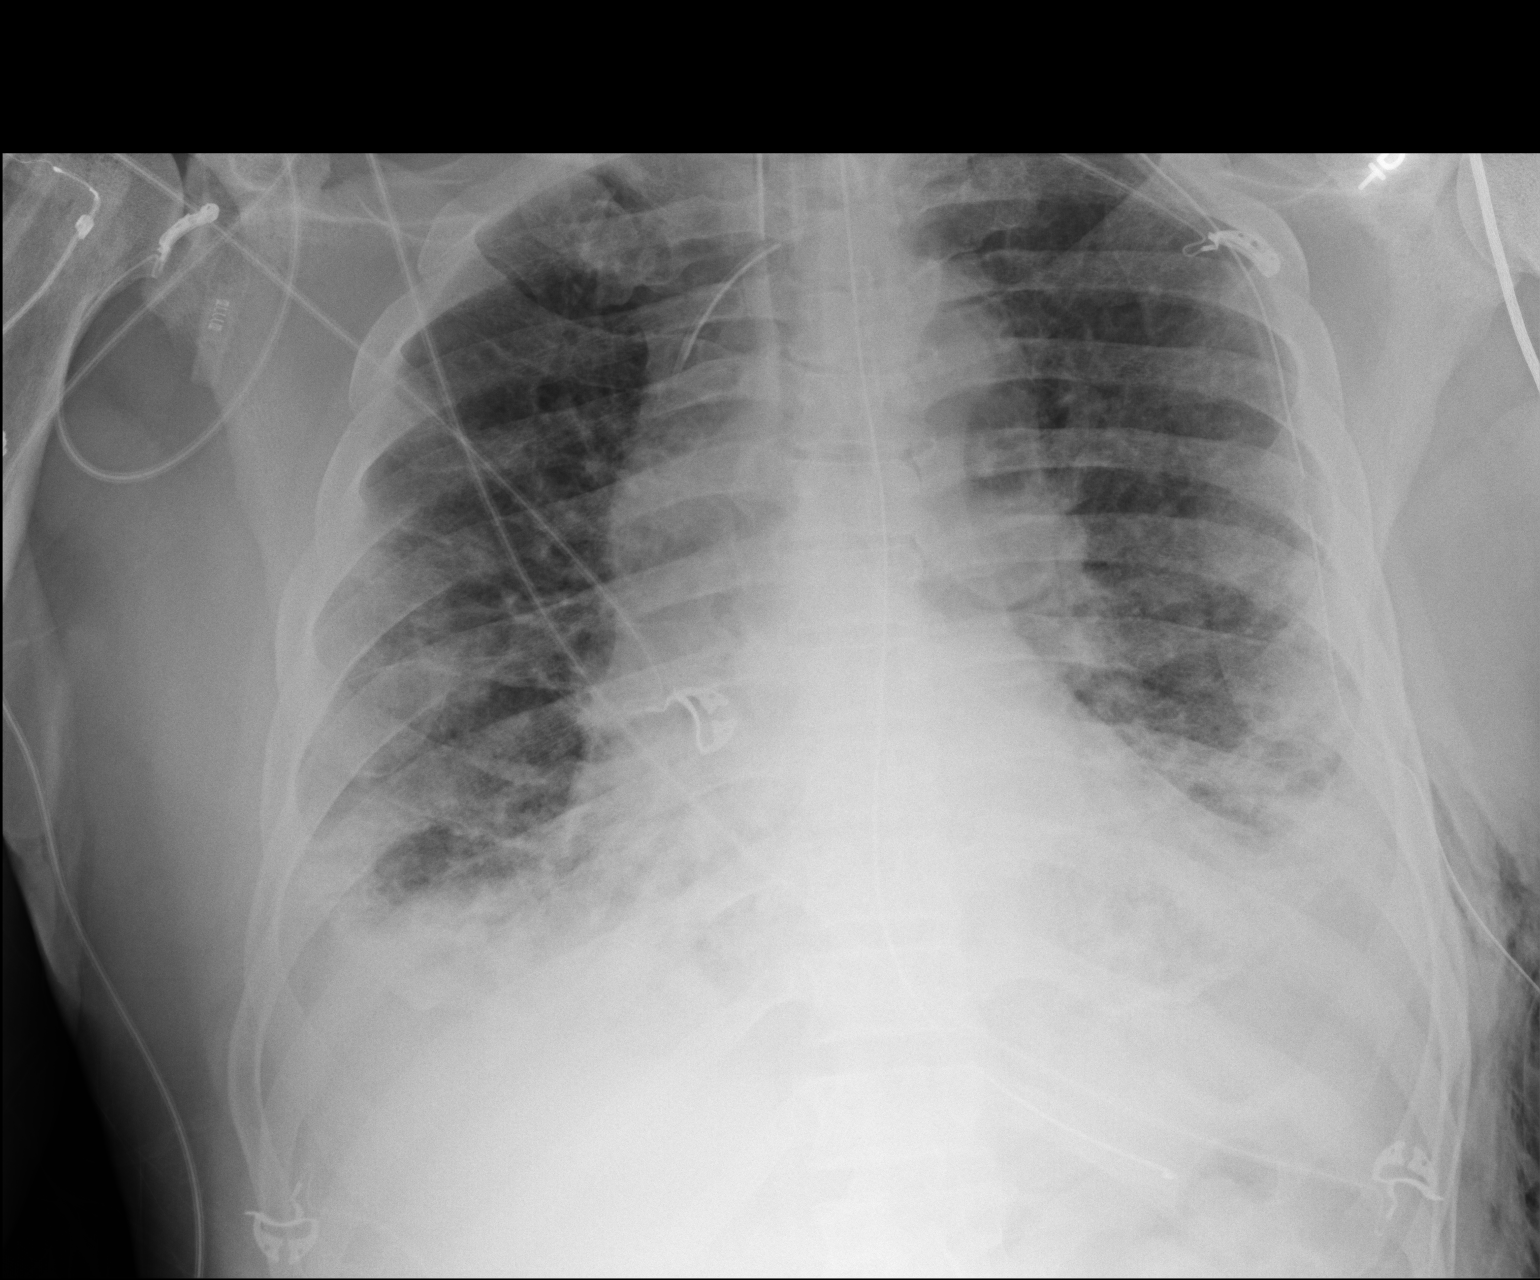

[1 of 1 positions shown; findings below may reference images not displayed]

FINDINGS: Patchy bilateral lower lobe opacities. Possible small bilateral
pleural effusions.

Stable left apical chest tube.  No pneumothorax.

Cardiomegaly.

Endotracheal tube terminates 7 cm above the carina.

Stable left IJ venous catheter terminates in the mid SVC.

Enteric tube terminates in the proximal gastric body.
IMPRESSION: Endotracheal tube terminates 7 cm above the carina.

Patchy bilateral lower lobe opacities, unchanged. Possible small
bilateral pleural effusions.

Stable left apical chest tube.  No pneumothorax is seen.

## 2015-09-09 IMAGING — CR DG CHEST 1V PORT
1 series · 1 of 1 positions shown · non-contrast
Comparison: 07/18/2014

CLINICAL DATA: ARDS

EXAM:
PORTABLE CHEST - 1 VIEW

[AP]
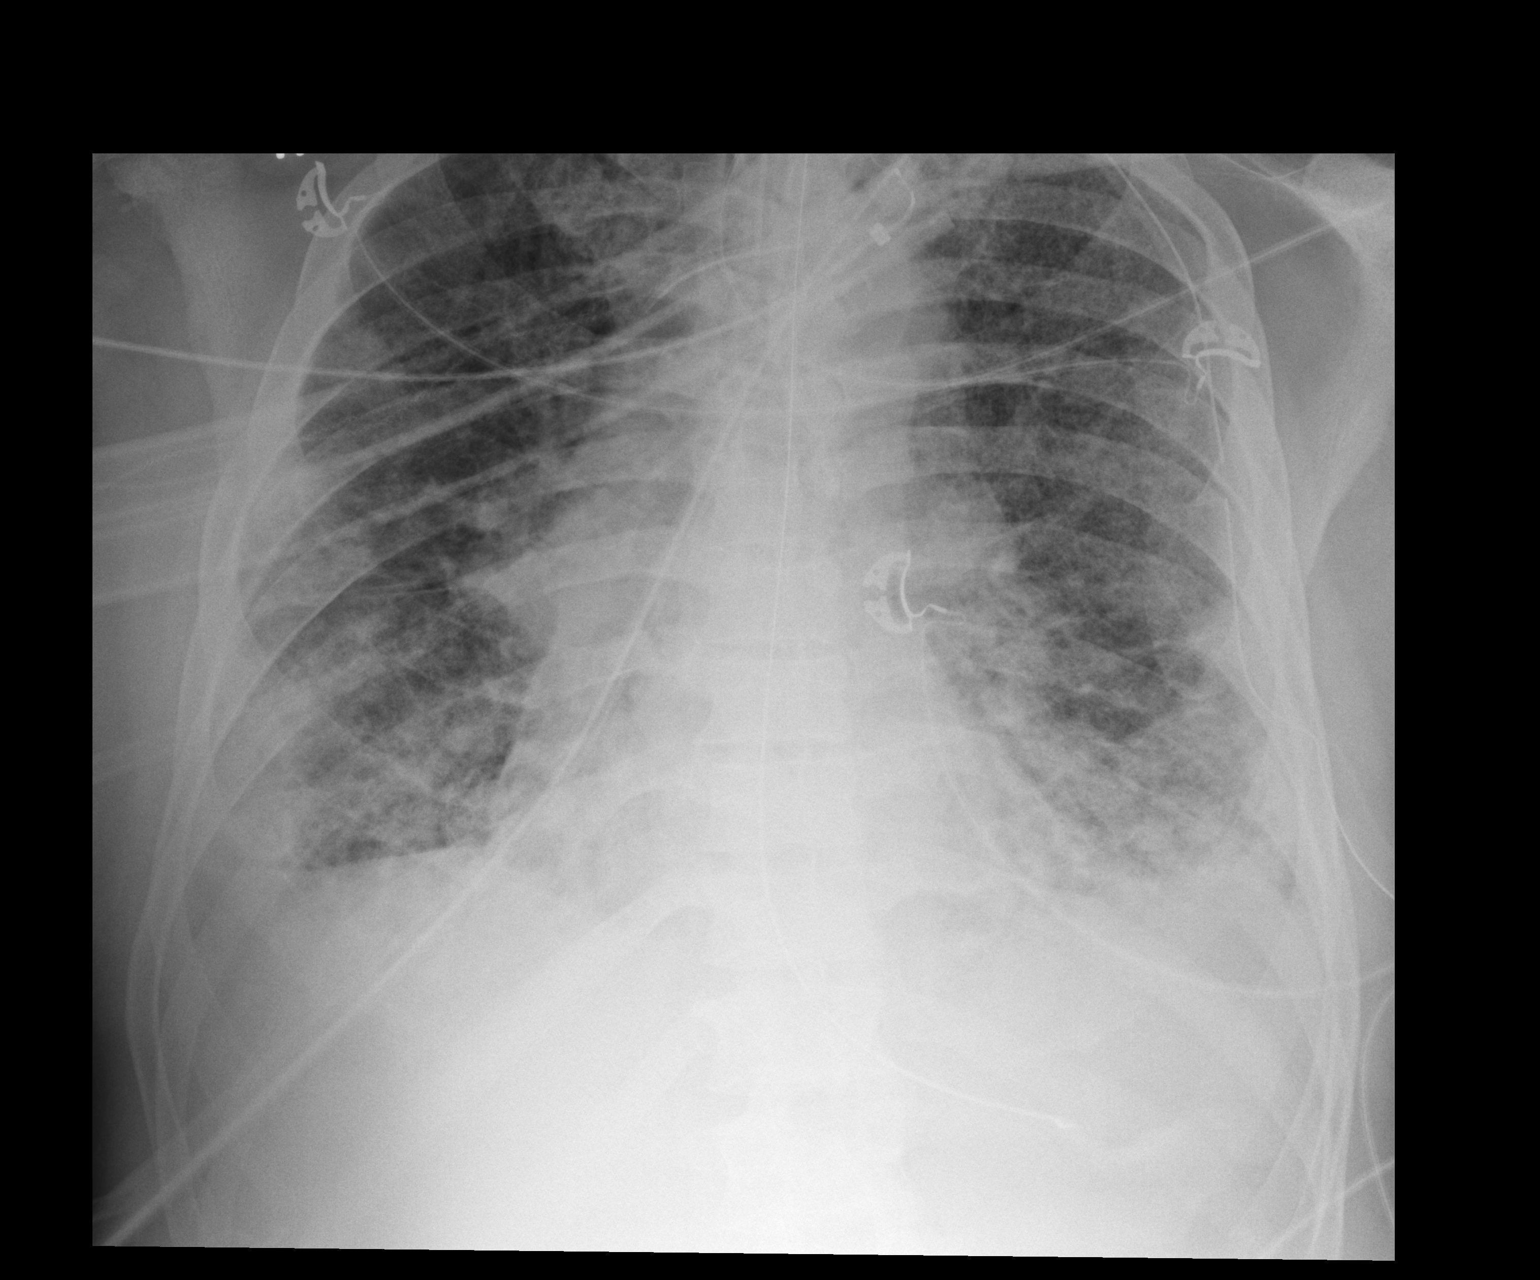

[1 of 1 positions shown; findings below may reference images not displayed]

FINDINGS: Cardiomediastinal silhouette is stable. Endotracheal tube and NG
tube are unchanged in position. Stable left IJ central line
position. Stable left chest tube position. Again noted bilateral
diffuse airspace disease.
IMPRESSION: Stable support apparatus. Stable left chest tube position. Again
noted bilateral diffuse patchy airspace disease. Shoulder for the
the
